# Patient Record
Sex: Female | Born: 1950 | Race: White | Hispanic: No | Marital: Married
Health system: Midwestern US, Community
[De-identification: ages and names within clinical notes are randomized; demographics above are authoritative.]

## PROBLEM LIST (undated history)

## (undated) DIAGNOSIS — I350 Nonrheumatic aortic (valve) stenosis: Secondary | ICD-10-CM

## (undated) DIAGNOSIS — M66872 Spontaneous rupture of other tendons, left ankle and foot: Secondary | ICD-10-CM

## (undated) DIAGNOSIS — I7300 Raynaud's syndrome without gangrene: Secondary | ICD-10-CM

## (undated) DIAGNOSIS — M1380 Other specified arthritis, unspecified site: Secondary | ICD-10-CM

## (undated) DIAGNOSIS — M1990 Unspecified osteoarthritis, unspecified site: Secondary | ICD-10-CM

## (undated) DIAGNOSIS — E139 Other specified diabetes mellitus without complications: Secondary | ICD-10-CM

## (undated) DIAGNOSIS — M359 Systemic involvement of connective tissue, unspecified: Secondary | ICD-10-CM

## (undated) DIAGNOSIS — G4730 Sleep apnea, unspecified: Secondary | ICD-10-CM

## (undated) DIAGNOSIS — Z8669 Personal history of other diseases of the nervous system and sense organs: Secondary | ICD-10-CM

## (undated) DIAGNOSIS — E785 Hyperlipidemia, unspecified: Secondary | ICD-10-CM

## (undated) DIAGNOSIS — K219 Gastro-esophageal reflux disease without esophagitis: Secondary | ICD-10-CM

## (undated) DIAGNOSIS — R112 Nausea with vomiting, unspecified: Secondary | ICD-10-CM

## (undated) DIAGNOSIS — R0600 Dyspnea, unspecified: Secondary | ICD-10-CM

## (undated) DIAGNOSIS — G709 Myoneural disorder, unspecified: Secondary | ICD-10-CM

## (undated) DIAGNOSIS — J45909 Unspecified asthma, uncomplicated: Secondary | ICD-10-CM

## (undated) DIAGNOSIS — Q631 Lobulated, fused and horseshoe kidney: Secondary | ICD-10-CM

## (undated) DIAGNOSIS — Z9889 Other specified postprocedural states: Secondary | ICD-10-CM

## (undated) DIAGNOSIS — M341 CR(E)ST syndrome: Secondary | ICD-10-CM

## (undated) DIAGNOSIS — M797 Fibromyalgia: Secondary | ICD-10-CM

## (undated) DIAGNOSIS — R011 Cardiac murmur, unspecified: Secondary | ICD-10-CM

## (undated) DIAGNOSIS — F32A Depression, unspecified: Secondary | ICD-10-CM

## (undated) DIAGNOSIS — I10 Essential (primary) hypertension: Secondary | ICD-10-CM

## (undated) DIAGNOSIS — M545 Low back pain, unspecified: Secondary | ICD-10-CM

## (undated) DIAGNOSIS — M25872 Other specified joint disorders, left ankle and foot: Secondary | ICD-10-CM

## (undated) DIAGNOSIS — M76822 Posterior tibial tendinitis, left leg: Secondary | ICD-10-CM

## (undated) HISTORY — DX: Essential (primary) hypertension: I10

## (undated) HISTORY — DX: Other specified diabetes mellitus without complications: E13.9

## (undated) HISTORY — DX: Unspecified asthma, uncomplicated: J45.909

## (undated) HISTORY — DX: Nonrheumatic aortic (valve) stenosis: I35.0

## (undated) HISTORY — PX: BREAST BIOPSY: SHX20

## (undated) HISTORY — DX: Systemic involvement of connective tissue, unspecified: M35.9

## (undated) HISTORY — PX: ABDOMINAL HYSTERECTOMY: SHX81

## (undated) HISTORY — PX: CATARACT EXTRACTION, BILATERAL: SHX1313

## (undated) HISTORY — PX: LUMBAR FUSION: SHX111

## (undated) HISTORY — DX: Hyperlipidemia, unspecified: E78.5

## (undated) HISTORY — PX: EYE SURGERY: SHX253

## (undated) HISTORY — PX: TUBAL LIGATION: SHX77

## (undated) HISTORY — PX: POSTERIOR TIBIAL TENDON REPAIR: SHX6039

## (undated) HISTORY — PX: ROTATOR CUFF REPAIR: SHX139

## (undated) HISTORY — PX: COLONOSCOPY WITH ESOPHAGOGASTRODUODENOSCOPY (EGD) AND ESOPHAGEAL DILATION (ED): SHX6495

## (undated) HISTORY — PX: CARDIAC CATHETERIZATION: SHX172

---

## 1971-10-21 HISTORY — PX: WISDOM TOOTH EXTRACTION: SHX21

## 2008-02-19 LAB — METABOLIC PANEL, COMPREHENSIVE
A-G Ratio: 1.1 (ref 1.1–2.2)
ALT (SGPT): 44 U/L (ref 30–65)
AST (SGOT): 23 U/L (ref 15–37)
Albumin: 4.2 g/dL (ref 3.5–5.0)
Alk. phosphatase: 95 U/L (ref 50–136)
Anion gap: 8 mmol/L (ref 5–15)
BUN/Creatinine ratio: 19 (ref 12–20)
BUN: 17 MG/DL (ref 6–20)
Bilirubin, total: 0.3 MG/DL (ref <1.0)
CO2: 30 MMOL/L (ref 21–32)
Calcium: 9.9 MG/DL (ref 8.5–10.1)
Chloride: 102 MMOL/L (ref 97–108)
Creatinine: 0.9 MG/DL (ref 0.6–1.3)
GFR est AA: >60 mL/min/{1.73_m2} (ref >60)
GFR est non-AA: >60 mL/min/{1.73_m2} (ref >60)
Globulin: 4 g/dL (ref 2.0–4.0)
Glucose: 105 MG/DL — ABNORMAL HIGH (ref 50–100)
Potassium: 3.1 MMOL/L — ABNORMAL LOW (ref 3.5–5.1)
Protein, total: 8.2 g/dL (ref 6.4–8.2)
Sodium: 140 MMOL/L (ref 136–145)

## 2008-02-19 LAB — CBC WITH AUTOMATED DIFF
ABS. BASOPHILS: 0.1 10*3/uL (ref 0.0–0.1)
ABS. EOSINOPHILS: 0.2 10*3/uL (ref 0.0–0.4)
ABS. LYMPHOCYTES: 2.5 10*3/uL (ref 0.8–3.5)
ABS. MONOCYTES: 0.4 10*3/uL (ref 0–1.0)
ABS. NEUTROPHILS: 3.3 10*3/uL (ref 1.8–8.0)
BASOPHILS: 1 % (ref 0–1)
EOSINOPHILS: 3 % (ref 0–7)
HCT: 43.1 % (ref 35.0–47.0)
HGB: 15.1 g/dL (ref 11.5–16.0)
LYMPHOCYTES: 38 % (ref 12–49)
MCH: 31.9 PG (ref 26.0–34.0)
MCHC: 35 g/dL (ref 30.0–35.0)
MCV: 90.9 FL (ref 80.0–99.0)
MONOCYTES: 6 % (ref 5–13)
NEUTROPHILS: 52 % (ref 32–75)
PLATELET: 302 10*3/uL (ref 150–400)
RBC: 4.74 M/uL (ref 3.80–5.20)
RDW: 12.6 % (ref 11.5–14.5)
WBC: 6.5 10*3/uL (ref 3.6–11.0)

## 2008-02-19 LAB — CK W/ REFLX CKMB: CK: 49 U/L (ref 21–215)

## 2008-02-19 LAB — NT-PRO BNP: NT pro-BNP: <10 PG/ML (ref 0–125)

## 2008-02-19 LAB — TROPONIN I: Troponin-I, Qt.: <0.04 ng/mL (ref 0.0–0.05)

## 2011-03-03 NOTE — Procedures (Signed)
Name:       [name]                     Study Date:  02/11/2011                                               Ordered By:  Ria Clock, MD  Account #:  [id number]                     Sex:         F  DOB:        09-19-1951     Age:   59        Location:    [facility]                                  PULMONARY TEST    STUDY DATE:  02/11/11                 Ref.       Pre-Meas   Pre        Post       Post       Post                                     %Ref.      Meas       %Ref       %Change  SPIROMETRY  FVC (L)      2.90       3.09       107        3.28       113        6  FEV1 (L)     2.35       2.21       94         2.37       101        7  FEV1/FVC%    83         72         87         72         87         0  FEF 25-75%   2.54       1.46       58         1.67       66         14  PEF L/s      5.60       6.78       121        7.27       130        7  FET 100% (s)            9.64                  7.58                  -21  FIVC (L)     2.90  2.83       98         3.02       104        7  FIF50% L/s              3.63                  3.71                  2  FVL ECode               000010                001010  MVV L/min    92         90         98         86         94         -4    LUNG         VOLUMES    TLC (L)  VC (L)  IC (L)  FRC PL (L)  Vtg (L)  ERV (L)  RV (L)  RV/TLC %  Vt (L)    DIFFUSION    CAPACITY    DLCO  DL Adj  VA (L)  DLCO/VA  DL/VA Adj  IVC (L)      REASON FOR TEST: Asthma.  Spirometry was performed and it revealed: 1. No airflow obstruction.2.  Normal FVC.3. No significant improvement with bronchodilators.4. Flow  volume loop is within normal limits.                Vickki Muff, M.D.    cc:   Vickki Muff, M.D.        Ruby Cola., MD      ERG/wmx; D: 02/28/2011 ; T: 03/03/2011 12:38 P; DOC# [id number]; Job#  [id number]

## 2011-03-03 NOTE — Procedures (Signed)
Name:       [name], [name]                     Study Date:  02/11/2011                                               Ordered By:  Robert Rolfes, MD  Account #:  [id number]                     Sex:         F  DOB:        06/24/1951     Age:   59        Location:    [facility]                                  PULMONARY TEST    STUDY DATE:  02/11/11                 Ref.       Pre-Meas   Pre        Post       Post       Post                                     %Ref.      Meas       %Ref       %Change  SPIROMETRY  FVC (L)      2.90       3.09       107        3.28       113        6  FEV1 (L)     2.35       2.21       94         2.37       101        7  FEV1/FVC%    83         72         87         72         87         0  FEF 25-75%   2.54       1.46       58         1.67       66         14  PEF L/s      5.60       6.78       121        7.27       130        7  FET 100% (s)            9.64                  7.58                  -21  FIVC (L)     2.90         2.83       98         3.02       104        7  FIF50% L/s              3.63                  3.71                  2  FVL ECode               000010                001010  MVV L/min    92         90         98         86         94         -4    LUNG         VOLUMES    TLC (L)  VC (L)  IC (L)  FRC PL (L)  Vtg (L)  ERV (L)  RV (L)  RV/TLC %  Vt (L)    DIFFUSION    CAPACITY    DLCO  DL Adj  VA (L)  DLCO/VA  DL/VA Adj  IVC (L)      REASON FOR TEST: Asthma.  Spirometry was performed and it revealed: 1. No airflow obstruction.2.  Normal FVC.3. No significant improvement with bronchodilators.4. Flow  volume loop is within normal limits.                Corley Kohls R. Christphor Groft, M.D.    cc:   Melissa Pulido R. Pavlos Yon, M.D.        Robert J. Rolfes, Jr., MD      ERG/wmx; D: 02/28/2011 ; T: 03/03/2011 12:38 P; DOC# [id number]; Job#  [id number]

## 2011-10-21 HISTORY — PX: LUMBAR FUSION: SHX111

## 2012-05-20 ENCOUNTER — Encounter

## 2012-06-28 LAB — CBC W/O DIFF
HCT: 42 % (ref 35.0–47.0)
HGB: 14.1 g/dL (ref 11.5–16.0)
MCH: 31.1 PG (ref 26.0–34.0)
MCHC: 33.6 g/dL (ref 30.0–36.5)
MCV: 92.7 FL (ref 80.0–99.0)
PLATELET: 292 10*3/uL (ref 150–400)
RBC: 4.53 M/uL (ref 3.80–5.20)
RDW: 12.9 % (ref 11.5–14.5)
WBC: 6.8 10*3/uL (ref 3.6–11.0)

## 2012-06-28 LAB — METABOLIC PANEL, BASIC
Anion gap: 7 mmol/L (ref 5–15)
BUN/Creatinine ratio: 30 — ABNORMAL HIGH (ref 12–20)
BUN: 18 MG/DL (ref 6–20)
CO2: 31 MMOL/L (ref 21–32)
Calcium: 9.4 MG/DL (ref 8.5–10.1)
Chloride: 102 MMOL/L (ref 97–108)
Creatinine: 0.6 MG/DL (ref 0.45–1.15)
GFR est AA: >60 mL/min/{1.73_m2} (ref >60)
GFR est non-AA: >60 mL/min/{1.73_m2} (ref >60)
Glucose: 74 MG/DL (ref 65–100)
Potassium: 4 MMOL/L (ref 3.5–5.1)
Sodium: 140 MMOL/L (ref 136–145)

## 2012-06-28 LAB — EKG, 12 LEAD, INITIAL
Atrial Rate: 79 {beats}/min
Calculated P Axis: 39 degrees
Calculated R Axis: -3 degrees
Calculated T Axis: 46 degrees
Diagnosis: NORMAL
P-R Interval: 154 ms
Q-T Interval: 416 ms
QRS Duration: 88 ms
QTC Calculation (Bezet): 477 ms
Ventricular Rate: 79 {beats}/min

## 2012-06-28 LAB — TYPE & SCREEN
ABO/Rh(D): O NEG
Antibody screen: NEGATIVE

## 2012-06-28 LAB — URINALYSIS W/MICROSCOPIC
Bacteria: NEGATIVE /HPF
Bilirubin: NEGATIVE
Blood: NEGATIVE
Glucose: NEGATIVE MG/DL
Ketone: NEGATIVE MG/DL
Leukocyte Esterase: NEGATIVE
Nitrites: NEGATIVE
Protein: NEGATIVE MG/DL
Specific gravity: 1.007 (ref 1.003–1.030)
Urobilinogen: 0.2 EU/DL (ref 0.2–1.0)
pH (UA): 6 (ref 5.0–8.0)

## 2012-06-28 LAB — HEMOGLOBIN A1C WITH EAG
Est. average glucose: 120 mg/dL
Hemoglobin A1c: 5.8 % (ref 4.2–6.3)

## 2012-06-28 LAB — PROTHROMBIN TIME + INR
INR: 1 (ref 0.9–1.1)
Prothrombin time: 10.7 s (ref 9.4–11.7)

## 2012-06-28 LAB — PTT: aPTT: 27.3 s (ref 23.0–30.0)

## 2012-06-28 LAB — PREALBUMIN: Prealbumin: 37.5 mg/dL (ref 20.0–40.0)

## 2012-06-28 LAB — ALBUMIN: Albumin: 4 g/dL (ref 3.5–5.0)

## 2012-06-28 LAB — TYPE AND SCREEN
ABO/Rh: O NEG
Antibody Screen: NEGATIVE

## 2012-06-28 NOTE — Other (Addendum)
[facility]  Preoperative Instructions        Surgery Date  07-06-12          Time of Arrival 8:00am    1. On the day of your surgery, please report to the Surgical Services Registration Desk and sign in at your designated time. The Surgery Center is located to the right of the Emergency Room.    2. You must have someone with you to drive you home.You should not drive a car for 24 hours following surgery.    3. Do not have anything to eat or drink ( including water, gum, mints, coffee, juice) after midnight 07-05-12 . This may not apply to medications prescribed by your physician.  Please note special instructions, if applicable.  If you are currently taking Plavix, Coumadin, or other blood-thinning agents, contact your surgeon for instructions.    4. We recommend you do not drink any alcoholic beverages for 24 hours before and after your surgery.    5. Have a list of all current medications, including vitamins, herbal supplements and any other over the counter medications.  Stop Aspirin and non-steroidal anti-inflammatory drugs (I.e. Advil, Aleve) as directed by your surgeon's office. Stop all vitamins and herbal supplements seven days prior to your surgery.    6. Wear comfortable clothes.  Wear glasses instead of contacts.  Do not bring any money or jewelry. Please bring picture ID, insurance card, and any prearranged co-payment or hospital payment.  Do not wear make-up, particularly mascara, the morning of your surgery.  Do not wear nail polish, particularly if you are having foot /hand surgery.  Wear your hair loose or down, no ponytails, buns, bobby pins or clips.  All body piercings must be removed.  Please shower the night before or morning of surgery, but do not apply any lotions, powders or deodorants afterwards. Please do not shave for 48 hours prior to surgery, shaving of the face is acceptable.    7. You should understand that if you do not follow these instructions your surgery  may be cancelled.  If your physical condition changes (I.e. fever, cold or flu) please contact your surgeon as soon as possible.    8. It is important that you be on time.  If a situation occurs where you may be late, please call [phone] (OR Holding Area).    9. If you have any questions and or problems, please call [phone] (Pre-admission Testing).    10. Your surgery time may be subject to change.  You will receive a phone call the evening prior if your time changes.    11.  If having outpatient surgery, you must have someone to drive you here, stay with you during the duration of your stay, and to drive you home at time of discharge.      Special Instructions:    MEDICATIONS TO TAKE THE MORNING OF SURGERY WITH A SIP OF WATER: [email or web address]      I understand a pre-operative phone call will be made to verify my surgery time.  In the event that I am not available, I give permission for a message to be left on my answering service and/or with another person?  Yes [phone]         ___________________      ___________________  _________  (Signature of Patient)          (Witness)                              (  Date and Time)

## 2012-06-29 LAB — CULTURE, MRSA

## 2012-06-29 LAB — CULTURE, URINE
Colonies Counted: <1000
Colony Count: <1000
Culture result:: NO GROWTH
Culture: NO GROWTH

## 2012-06-29 LAB — VITAMIN D, 25 HYDROXY: Vitamin D 25-Hydroxy: 19.2 ng/mL — ABNORMAL LOW (ref 30–100)

## 2012-06-30 NOTE — Other (Signed)
Vit D faxed,confirmation.

## 2012-07-06 ENCOUNTER — Inpatient Hospital Stay
Admit: 2012-07-06 | Discharge: 2012-07-09 | Disposition: A | Discharge status: Home Health | Payer: BLUE CROSS/BLUE SHIELD | Attending: Orthopaedic Surgery | Admitting: Orthopaedic Surgery

## 2012-07-06 DIAGNOSIS — M4310 Spondylolisthesis, site unspecified: Secondary | ICD-10-CM

## 2012-07-06 MED ADMIN — dexamethasone (DECADRON) 4 mg/mL injection: @ 16:00:00 | NDC 63323016501

## 2012-07-06 MED ADMIN — oxyCODONE IR (ROXICODONE) tablet 5-10 mg: ORAL | @ 21:00:00 | NDC 68084035411

## 2012-07-06 MED ADMIN — fentaNYL citrate (PF) 50 mcg/mL injection: INTRAVENOUS | @ 16:00:00 | NDC 68258301001

## 2012-07-06 MED ADMIN — lactated ringers infusion: INTRAVENOUS | @ 15:00:00 | NDC 11845118709

## 2012-07-06 MED ADMIN — bacitracin 50,000 Units in sodium chloride irrigation 0.9 % 1,000 mL Irrigation: @ 17:00:00 | NDC 63323032930

## 2012-07-06 MED ADMIN — HYDROmorphone (PF) 15 mg/30 ml (DILAUDID) 15 mg/30 mL (0.5 mg/mL) PCA: INTRAVENOUS | @ 19:00:00 | NDC 02420030164

## 2012-07-06 MED ADMIN — acetaminophen (OFIRMEV) 1,000 mg/100 mL (10 mg/mL) infusion: @ 16:00:00 | NDC 43825010201

## 2012-07-06 MED ADMIN — 0.9% sodium chloride with KCl 20 mEq/L 20 mEq/L infusion: INTRAVENOUS | @ 19:00:00 | NDC 00409711509

## 2012-07-06 MED ADMIN — ceFAZolin (ANCEF) injection 2 g: INTRAVENOUS | @ 16:00:00 | NDC 00781345196

## 2012-07-06 MED ADMIN — gabapentin (NEURONTIN) capsule 100 mg: ORAL | @ 21:00:00 | NDC 68084059411

## 2012-07-06 MED ADMIN — sodium chloride (NS) flush 5-10 mL: INTRAVENOUS | @ 18:00:00 | NDC 87701099893

## 2012-07-06 MED ADMIN — lactated ringers infusion: INTRAVENOUS | @ 13:00:00 | NDC 00409795309

## 2012-07-06 MED ADMIN — lactated ringers infusion: INTRAVENOUS | @ 20:00:00 | NDC 11845118709

## 2012-07-06 MED FILL — NS WITH POTASSIUM CHLORIDE 20 MEQ/L IV: 20 mEq/L | INTRAVENOUS | Qty: 1000

## 2012-07-06 MED FILL — FENTANYL CITRATE (PF) 50 MCG/ML IJ SOLN: 50 mcg/mL | INTRAMUSCULAR | Qty: 5

## 2012-07-06 MED FILL — MIDAZOLAM 1 MG/ML IJ SOLN: 1 mg/mL | INTRAMUSCULAR | Qty: 2

## 2012-07-06 MED FILL — GABAPENTIN 100 MG CAP: 100 mg | ORAL | Qty: 1

## 2012-07-06 MED FILL — SALINE FLUSH INJECTION SYRINGE: INTRAMUSCULAR | Qty: 10

## 2012-07-06 MED FILL — OFIRMEV 1,000 MG/100 ML (10 MG/ML) INTRAVENOUS SOLUTION: 1000 mg/100 mL (10 mg/mL) | INTRAVENOUS | Qty: 100

## 2012-07-06 MED FILL — INDAPAMIDE 2.5 MG TAB: 2.5 mg | ORAL | Qty: 1

## 2012-07-06 MED FILL — HYDROMORPHONE 15 MG/30 ML (0.5 MG/ML) IN 0.9% SOD.CHLORIDE INFUSION: 15 mg/30 mL (0.5 mg/mL) | INTRAVENOUS | Qty: 30

## 2012-07-06 MED FILL — OXYCODONE 5 MG TAB: 5 mg | ORAL | Qty: 2

## 2012-07-06 MED FILL — LACTATED RINGERS IV: INTRAVENOUS | Qty: 1000

## 2012-07-06 MED FILL — BACITRACIN 50,000 UNIT IM: 50000 unit | INTRAMUSCULAR | Qty: 50000

## 2012-07-06 MED FILL — DEXAMETHASONE SODIUM PHOSPHATE 4 MG/ML IJ SOLN: 4 mg/mL | INTRAMUSCULAR | Qty: 1

## 2012-07-06 NOTE — Op Note (Signed)
Name:      Ian Bushman                                          Surgeon:        Job Founds, MD  Account #: [id number]                 Surgery Date:   07/06/2012  DOB:       [birth year]  Age:       61                           Location:                                 OPERATIVE REPORT      PREOPERATIVE DIAGNOSES  1. L4-L5 spondylolisthesis.  2. Spinal stenosis with claudication at L3-L4 and L4-L5.  3. Sciatica.  4. Lumbago.  5. Obesity.    POSTOPERATIVE DIAGNOSES  1. L4-L5 spondylolisthesis.  2. Spinal stenosis with claudication at L3-L4 and L4-L5.  3. Sciatica.  4. Lumbago.  5. Obesity.    PROCEDURES PERFORMED  1. L4-L5 posterior instrumented fusion with a transforaminal lumbar  interbody fusion.  2. L4-L5 insertion of interbody biomechanical device.  3. Use of operative microscope.  4. Use of local autograft bone for spine fusion.  5. Use of allograft bone for spine fusion.  6. L4 Gill laminectomy.  7. L5 laminectomy, facetectomy and foraminotomy.  8. L3 laminectomy, facetectomy and foraminotomy.  9. Posterior instrumentation, 1 level.    Please note that a modifier -22 was used for all the codes in this case due  to the patient's obesity with a BMI of 38, requiring 50% longer time to  complete this procedure.    SURGEON: Theodis Sato, MD.    FIRST ASSISTANT: Linward Foster, PA-c.    ESTIMATED BLOOD LOSS: Less than 100 mL.    COMPLICATIONS: None.    IMPLANTS USED: The Synthes TPAL interbody biomechanical spacer and the  Synthes Matrix pedicle screw system.    ANESTHESIA:  General.    SPECIMENS REMOVED:  None.    INDICATIONS FOR THE PROCEDURE: The patient is a very pleasant 61 year old  lady presenting here for surgery today. She has failed conservative  measures and has at this point decided to proceed with surgical  intervention. We have given her warnings about possible complications,  including but not limited to pain, scar, bleeding, infection, nonunion,  damage to surrounding structures, death,  paralysis, blindness and stroke.  She understands and wants to proceed.    DESCRIPTION OF PROCEDURE: After informed consent was obtained and the  operative site was properly marked, the patient was moved back in the  operating room and underwent general endotracheal anesthesia. She was  positioned prone on the operating room table using the [employer] frame. Fluoroscopy was used to mark the level of the incision. The  knees were gently bent with pillows. The arms were placed in 90/90  position. The area was then prepped and draped in the usual manner,  followed by obtaining a time-out, verifying that this was the correct  patient, the correct surgery and the correct site as well as that she had  received IV antibiotics within 30 minutes of the incision.  We then proceeded to perform a standard posterior approach to the lumbar  spine, exposing the area between L3, L4 and L5. Once that was exposed, we  proceeded then to obtain hemostasis; and then using fluoroscopy on the AP  view, we proceeded to place pedicle screws bilaterally at L4 and L5. The  pedicle screw was placed by getting a starting hole with a Midas Rex and  then using the tap through that hole to cannulate the pedicle. The pedicles  were never stimulated as it was inserted, and we proceeded then to insert  the screw of premeasured length on that area. Once all the screws were in  place, we proceeded to switch to a lateral view; and then on the lateral,  we were able to finish inserting the screws to the appropriate depth. Once  the screws were inserted, we proceeded then to use the right L5 screw as a  marker to serve to create a channel for the L4-L5 TLIF. Using the Midas  Rex, we proceeded to remove the bone over Codman's triangle, exposing the  disk space on the right side at L4-L5. With that bone exposed and the  neural structures protected by the bony structures, we were able then to  use that channel to instrument the disk for the  TLIF. The disk was first  entered with a curet, followed by using box curettes, straight and curved  and then rasps straight and finally using shavers and pituitary to remove  the disk material. Once the area was prepared, leaving behind punctate  bleeding bone, that area was packed with Trinity allograft bone. That bone  was packed anteriorly with a TLIF trial, and a TLIF was inserted and turned  90 degrees anteriorly. Once the TLIF was in place, we proceeded then to  begin our decompression part of the procedure. Using a Midas Rex, we  started at L3 around the pedicle of L4, wrapping around, creating a keyhole  foraminotomy around the 7 o'clock position on the pedicle of L4, and  freeing the L4 nerve root in that area; and then once the keyhole was open,  we followed that hole with a Kerrison #3 proximally, wrapping around the  pedicle and freeing the lateral recess at the L3 level. The procedure was  repeated then in the same manner on the L5 pedicle bilaterally, freeing  bilateral lateral recesses at L4 and foraminotomy at L5. Once this was  completed, the nerve roots were felt with a Murphy ball, verifying that  they were free. We proceeded then to irrigate the wound with 3 L of normal  saline. We proceeded to place our tulips on the screws; and then once we  placed our tulips, we were able to place our rods and our caps and lock  them into position. We proceeded then to decorticate the posterior elements  on the left side and placed our Trinity allograft bone in that area mixed  with local autograft bone. We then proceeded to place 1 gram of vancomycin  powder on the right side and close the fascia with #1 Vicryl, followed by  irrigating the subcutaneous tissue, placing a superficial drain, and  closing the subcutaneous fat with a V-Loc followed by closing the  subcutaneous tissue with 2-0 Vicryl and the skin with a 2-0 Vicryl running  suture and Dermabond. Sterile dressing was applied. The patient  was  awakened and transferred to the PACU in stable condition.    POSTOPERATIVE PLAN: The patient is  going to remain here for about 2 or 3  days. We are going give her TED hose for DVT prophylaxis and Ancef for  infection prophylaxis.        Reviewed on 07/06/2012 6:35 PM          Job Founds, MD    cc:   Job Founds, MD    [facility]      AAR/wmx; D: 07/06/2012 02:55 P; T: 07/06/2012 06:20 P; Doc# [id number]; Job#  [id number]

## 2012-07-06 NOTE — Other (Signed)
1228 family updated to surgery start time.

## 2012-07-06 NOTE — Op Note (Signed)
Name:      Ian Bushman                                          Surgeon:        Job Founds, MD  Account #: [id number]                 Surgery Date:   07/06/2012  DOB:       [birth year]  Age:       61                           Location:                                 OPERATIVE REPORT      PREOPERATIVE DIAGNOSES  1. L4-L5 spondylolisthesis.  2. Spinal stenosis with claudication at L3-L4 and L4-L5.  3. Sciatica.  4. Lumbago.  5. Obesity.    POSTOPERATIVE DIAGNOSES  1. L4-L5 spondylolisthesis.  2. Spinal stenosis with claudication at L3-L4 and L4-L5.  3. Sciatica.  4. Lumbago.  5. Obesity.    PROCEDURES PERFORMED  1. L4-L5 posterior instrumented fusion with a transforaminal lumbar  interbody fusion.  2. L4-L5 insertion of interbody biomechanical device.  3. Use of operative microscope.  4. Use of local autograft bone for spine fusion.  5. Use of allograft bone for spine fusion.  6. L4 Gill laminectomy.  7. L5 laminectomy, facetectomy and foraminotomy.  8. L3 laminectomy, facetectomy and foraminotomy.  9. Posterior instrumentation, 1 level.    Please note that a modifier -22 was used for all the codes in this case due  to the patient's obesity with a BMI of 38, requiring 50% longer time to  complete this procedure.    SURGEON: Theodis Sato, MD.    FIRST ASSISTANT: Linward Foster, PA-c.    ESTIMATED BLOOD LOSS: Less than 100 mL.    COMPLICATIONS: None.    IMPLANTS USED: The Synthes TPAL interbody biomechanical spacer and the  Synthes Matrix pedicle screw system.    ANESTHESIA:  General.    SPECIMENS REMOVED:  None.    INDICATIONS FOR THE PROCEDURE: The patient is a very pleasant 60 year old  lady presenting here for surgery today. She has failed conservative  measures and has at this point decided to proceed with surgical  intervention. We have given her warnings about possible complications,  including but not limited to pain, scar, bleeding, infection, nonunion,  damage to surrounding structures, death,  paralysis, blindness and stroke.  She understands and wants to proceed.    DESCRIPTION OF PROCEDURE: After informed consent was obtained and the  operative site was properly marked, the patient was moved back in the  operating room and underwent general endotracheal anesthesia. She was  positioned prone on the operating room table using the [employer] frame. Fluoroscopy was used to mark the level of the incision. The  knees were gently bent with pillows. The arms were placed in 90/90  position. The area was then prepped and draped in the usual manner,  followed by obtaining a time-out, verifying that this was the correct  patient, the correct surgery and the correct site as well as that she had  received IV antibiotics within 30 minutes of the incision.  We then proceeded to perform a standard posterior approach to the lumbar  spine, exposing the area between L3, L4 and L5. Once that was exposed, we  proceeded then to obtain hemostasis; and then using fluoroscopy on the AP  view, we proceeded to place pedicle screws bilaterally at L4 and L5. The  pedicle screw was placed by getting a starting hole with a Midas Rex and  then using the tap through that hole to cannulate the pedicle. The pedicles  were never stimulated as it was inserted, and we proceeded then to insert  the screw of premeasured length on that area. Once all the screws were in  place, we proceeded to switch to a lateral view; and then on the lateral,  we were able to finish inserting the screws to the appropriate depth. Once  the screws were inserted, we proceeded then to use the right L5 screw as a  marker to serve to create a channel for the L4-L5 TLIF. Using the Midas  Rex, we proceeded to remove the bone over Codman's triangle, exposing the  disk space on the right side at L4-L5. With that bone exposed and the  neural structures protected by the bony structures, we were able then to  use that channel to instrument the disk for the  TLIF. The disk was first  entered with a curet, followed by using box curettes, straight and curved  and then rasps straight and finally using shavers and pituitary to remove  the disk material. Once the area was prepared, leaving behind punctate  bleeding bone, that area was packed with Trinity allograft bone. That bone  was packed anteriorly with a TLIF trial, and a TLIF was inserted and turned  90 degrees anteriorly. Once the TLIF was in place, we proceeded then to  begin our decompression part of the procedure. Using a Midas Rex, we  started at L3 around the pedicle of L4, wrapping around, creating a keyhole  foraminotomy around the 7 o'clock position on the pedicle of L4, and  freeing the L4 nerve root in that area; and then once the keyhole was open,  we followed that hole with a Kerrison #3 proximally, wrapping around the  pedicle and freeing the lateral recess at the L3 level. The procedure was  repeated then in the same manner on the L5 pedicle bilaterally, freeing  bilateral lateral recesses at L4 and foraminotomy at L5. Once this was  completed, the nerve roots were felt with a Murphy ball, verifying that  they were free. We proceeded then to irrigate the wound with 3 L of normal  saline. We proceeded to place our tulips on the screws; and then once we  placed our tulips, we were able to place our rods and our caps and lock  them into position. We proceeded then to decorticate the posterior elements  on the left side and placed our Trinity allograft bone in that area mixed  with local autograft bone. We then proceeded to place 1 gram of vancomycin  powder on the right side and close the fascia with #1 Vicryl, followed by  irrigating the subcutaneous tissue, placing a superficial drain, and  closing the subcutaneous fat with a V-Loc followed by closing the  subcutaneous tissue with 2-0 Vicryl and the skin with a 2-0 Vicryl running  suture and Dermabond. Sterile dressing was applied. The patient was   awakened and transferred to the PACU in stable condition.    POSTOPERATIVE PLAN: The patient is  going to remain here for about 2 or 3  days. We are going give her TED hose for DVT prophylaxis and Ancef for  infection prophylaxis.        Reviewed on 07/06/2012 6:35 PM          Job Founds, MD    cc:   Job Founds, MD    [facility] BILLING OFFICE      AAR/wmx; D: 07/06/2012 02:55 P; T: 07/06/2012 06:20 P; Doc# [id number]; Job#  [id number]

## 2012-07-06 NOTE — Progress Notes (Signed)
+  Post-Anesthesia Evaluation and Assessment    Patient: [name] MRN: [id number]  SSN: [id number]   Date of Birth: 12-11-1950  Age: 61 y.o.  Sex: female      Cardiovascular Function/Vital Signs  Visit Vitals   Item Reading   ??? BP 140/71   ??? Pulse 96   ??? Temp 99.2 ??F (37.3 ??C)   ??? Resp 22   ??? SpO2 98%       Patient is status post Procedure(s) with comments:  SPINE LUMBAR LAMINECTOMY WITH INSTRUMENTATION - L4-5 DECOMPRESSION AND FUSION.    Nausea/Vomiting: Controlled.    Postoperative hydration reviewed and adequate.    Pain:  Pain Scale 1: Numeric (0 - 10) (07/06/12 1504)  Pain Intensity 1: 0 (07/06/12 1504)   Managed.    Neurological Status:   Neuro (WDL): Within Defined Limits (07/06/12 1504)   At baseline.    Mental Status and Level of Consciousness: Arousable.    Pulmonary Status:   O2 Device: Nasal cannula (07/06/12 1504)   Adequate oxygenation and airway patent.    Complications related to anesthesia: None    Post-anesthesia assessment completed. No concerns.    Signed By: Adora Fridge, MD    07/06/2012

## 2012-07-06 NOTE — Brief Op Note (Signed)
BRIEF OPERATIVE NOTE    Date of Procedure: 07/06/2012   Preoperative Diagnosis: LUMBARGO, RIDICULOPATHY, STENOSIS  Postoperative Diagnosis: LUMBAGO, RIDICULOPATHY, STENOSIS    Procedure: Procedure(s):  L4-5 DECOMPRESSION AND FUSION  Surgeon(s) and Role:     * Job Founds, MD - Primary  Anesthesia: General   Estimated Blood Loss: 100cc  Specimens: * No specimens in log *   Findings: spondylolisthesis   Complications: None  Implants:   Implant Name Type Inv. Item Serial No. Manufacturer Lot No. LRB No. Used Action   TRINITY ELITE, EXTRA LARGE   [id number]  NA N/A 1 Implanted   SCR SPNE MATRIX 6X45MM TI - SNA  SCR SPNE MATRIX 6X45MM TI NA SYNTHES [place]  N/A 4 Implanted   LOCKING CAP    NA   N/A 4 Implanted   REDUCTION HEAD    NA   N/A 4 Implanted   ROD SPNE CRV MATRIX 5.5X45MM - SNA  ROD SPNE CRV MATRIX 5.5X45MM NA SYNTHES [place]  N/A 2 Implanted    TAP      NA     N/A 1 Implanted

## 2012-07-06 NOTE — Other (Signed)
Handoff Report from Operating Room to PACU    Report received from C. Rushing CRNA and Darla Lesches RN  regarding [name].      Surgeon(s):  Job Founds, MD  And Procedure(s) (LRB):  SPINE LUMBAR LAMINECTOMY WITH INSTRUMENTATION (N/A)  confirmed   with allergies, drains and dressings discussed.    Anesthesia type, drugs, patient history, complications, estimated blood loss, vital signs, intake and output, and last pain medication and lines were reviewed.

## 2012-07-06 NOTE — H&P (Signed)
HISTORY AND PHYSICAL    Subjective:     CHIEF COMPLAINT:  Low back pain radiating to bilateral lower extremities.       HISTORY OF PRESENT ILLNESS:  Ms. Stickles is a pleasant 61 year old lady coming in today for follow up of low back pain radiating down bilateral lower extremities through the buttocks causing numbness and tingling in the legs all the way down.  She has had epidural steroid injections with no relief.  She has also had chiropractor and therapy.  At this point she is considering surgical intervention.    There are no active problems to display for this patient.    Past Medical History   Diagnosis Date   ??? Hypertension    ??? Autoimmune disease      connective tissue disease   ??? Other ill-defined conditions      heart mumur   ??? Other and unspecified symptoms and signs involving general sensations and perceptions      fibromyalgia/osteo arthritis   ??? GERD (gastroesophageal reflux disease)    ??? Arthritis      osteo arthritis   ??? Asthma    ??? Thyroid disease      goiter/no longer on medication   ??? Nausea & vomiting       Past Surgical History   Procedure Date   ??? Hx orthopaedic      right rotator cuff repair   ??? Hx hysterectomy    ??? Hx tubal ligation    ??? Hx other surgical      cystectomy right shoulder blade   ??? Hx other surgical      wisdom teeth extraction      Prior to Admission medications    Medication Sig Start Date End Date Taking? Authorizing Provider   FLAXSEED OIL PO Take 1,200 mg by mouth daily.    Historical Provider   multivitamin (ONE A DAY) tablet Take 1 Tab by mouth daily.    Historical Provider   valsartan (DIOVAN) 80 mg tablet Take  by mouth daily.    Historical Provider   acetaminophen-caff-pyrilamine 500-60-15 mg Tab Take  by mouth as needed.    Historical Provider   loratadine 10 mg cap Take  by mouth daily.    Historical Provider   indapamide (LOZOL) 2.5 mg tablet Take  by mouth daily.    Historical Provider   potassium chloride (KLOR-CON M20) 20 mEq tablet Take  by mouth daily.     Historical Provider   omeprazole (PRILOSEC) 20 mg capsule Take 20 mg by mouth two (2) times a day.    Historical Provider   mometasone (NASONEX) 50 mcg/actuation nasal spray 2 Sprays daily.    Historical Provider     Allergies   Allergen Reactions   ??? Adhesive Rash   ??? Dobutamine Other (comments)     Pt had a paradoxical reaction during a stress test and dropped her heart rate and b/p.  See stress test notes in cc.   ??? Sulfa (Sulfonamide Antibiotics) Rash      History   Substance Use Topics   ??? Smoking status: Never Smoker    ??? Smokeless tobacco: Not on file   ??? Alcohol Use: Yes      occassionally      Family History   Problem Relation Age of Onset   ??? Lung Disease Mother    ??? Hypertension Mother    ??? Heart Disease Father       Review of Systems  No bowel or bladder incontinence.    No fevers or chills.    No saddle anesthesia.     Objective:     No data found.    She is a well-developed, well-nourished patient who appears appropriate for her stated age.  CARDIOVASCULAR:    No swelling, edema or cyanosis.    She has 2+ dorsalis pedis pulses bilaterally.  RESPIRATORY:    No labored breathing.  LYMPHATIC:  No lymphadenopathy.  SKIN:     There are no rashes or lesions on the low back or bilateral lower extremities.    MUSCULOSKELETAL:    Her gait is normal.  She is able to stand on her heels and on her toes.   THORACOLUMBAR SPINE:   She has no masses, no tenderness to palpation.  She has full range of motion.    BILATERAL LOWER EXTREMITIES:  No masses, no tenderness to palpation, full range of motion.  No pain with internal and external rotation of the hips.  She has 5/5 strength in all the key muscle groups from L2 to S1.  Negative straight leg raise.       NEUROLOGIC EXAM:    She is active, A&O x 3.  Mood and affect are appropriate.  Sensation is intact to light touch in bilateral lower extremities.  Deep tendon reflexes are 2+ for bilateral patellar and Achilles tendon.  She has neutral Babinski.  No clonus to  bilateral ankles.  Negative Trendelenburg test bilaterally.    Imaging Review  Review of the MRI of the lumbar spine shows that she has worse spinal stenosis at L4-L5 with a grade I spondylolisthesis on the MRI.    Assessment:     Active Problems:   * No active hospital problems. *     Ms. Vlietstra is a pleasant 61 year old lady with lumbago, sciatica in bilateral lower extremities right being worse than the left, multilevel lumbar spondylosis and spondylolisthesis at L4-5 grade I.  She has had spinal stenosis with claudication which is worse in this recent MRI compared to the previous one.  At this point we talked about surgery.  We talked about doing an L4-L5 decompression and fusion with a R L3-4 decompression .  I have discussed this with her in detail today.  I have talked about the postoperative course, details of the surgery as well as possible complications including but not limited to pain, scar, bleeding, infection, nonunion, damage to surrounding structures, death, paralysis, blindness and stroke.  She understands and wants to proceed.       We discussed the risks of surgery include but are not limited to: death, heart attack, stroke, lung injury or infection, blindness, illeus, bladder or bowel problems, bleeding, nerve injury (including numbness, pain and weakness), paralysis (which may be permanent), failure to heal, failure to fuse bone together in fusion procedures, failure of relief of symptoms, failure of relief of pain, increased pain, need for further surgery, failure or breakage of hardware, malpositioning of hardware, need to fuse or operate on additional levels determined either during or after surgery, destabilization of the spine which may require fusion or later surgery, infections which  may or may not require additional surgery, dural tears (tears of the sack holding in nerves and spinal fluid, meningitis, voice changes, vocal cord injury, blood clots, pulmonary embolus, and anesthetic  complication.  Comorbidities such as obesity, smoking, rheumatoid arthritis, chronic steroid use and diabetes increases these risks.  He understands and wants to proceed.  Plan:     The various methods of treatment have been discussed with the patient and family.   After consideration of risks, benefits and other options for treatment, the patient has consented to surgical interventions (R L3-4 decompression with L4-5 decompression and fusion).  Questions were answered and Pre-op teaching was done by myself.

## 2012-07-06 NOTE — Other (Signed)
TRANSFER - OUT REPORT:    Verbal report given to Durene Cal RN  on [name]  being transferred to 3103(unit) for routine post - op       Report consisted of patient???s Situation, Background, Assessment and   Recommendations(SBAR).     Information from the following report(s) SBAR, Kardex, OR Summary, Procedure Summary, Intake/Output, MAR and Recent Results was reviewed with the receiving nurse.    Opportunity for questions and clarification was provided.

## 2012-07-06 NOTE — Progress Notes (Signed)
Bedside and Verbal shift change report given to Louann Liv, RN   (oncoming nurse) by Everardo Beals (offgoing nurse).  Report given with SBAR, Kardex, MAR and Recent Results.

## 2012-07-07 LAB — HEMOGLOBIN: HGB: 12.4 g/dL (ref 11.5–16.0)

## 2012-07-07 MED ADMIN — therapeutic multivitamin (THERAGRAN) tablet 1 Tab: ORAL | @ 13:00:00 | NDC 00182451810

## 2012-07-07 MED ADMIN — gabapentin (NEURONTIN) capsule 100 mg: ORAL | @ 02:00:00 | NDC 68084059411

## 2012-07-07 MED ADMIN — valsartan (DIOVAN) tablet 80 mg: ORAL | @ 16:00:00 | NDC 00078035834

## 2012-07-07 MED ADMIN — gabapentin (NEURONTIN) capsule 100 mg: ORAL | @ 13:00:00 | NDC 00904563161

## 2012-07-07 MED ADMIN — potassium chloride SR (KLOR-CON 10) tablet 20 mEq: ORAL | @ 15:00:00 | NDC 00245004189

## 2012-07-07 MED ADMIN — oxyCODONE IR (ROXICODONE) tablet 5-10 mg: ORAL | @ 15:00:00 | NDC 68084035411

## 2012-07-07 MED ADMIN — Yvonne, please let me know when husband brings Prilosec.  Thx. jml: @ 15:00:00 | NDC 00740100004

## 2012-07-07 MED ADMIN — gabapentin (NEURONTIN) capsule 100 mg: ORAL | @ 22:00:00 | NDC 68084059411

## 2012-07-07 MED ADMIN — fluticasone (FLONASE) 50 mcg/actuation nasal spray 2 Spray: NASAL | @ 20:00:00 | NDC 00054327099

## 2012-07-07 MED ADMIN — sodium chloride (NS) flush 5-10 mL: INTRAVENOUS | @ 20:00:00 | NDC 87701099893

## 2012-07-07 MED ADMIN — ascorbic acid (VITAMIN C) tablet 500 mg: ORAL | @ 22:00:00 | NDC 00409339732

## 2012-07-07 MED ADMIN — 0.9% sodium chloride with KCl 20 mEq/L infusion: INTRAVENOUS | @ 04:00:00 | NDC 00409711509

## 2012-07-07 MED ADMIN — ferrous sulfate tablet 325 mg: ORAL | @ 22:00:00

## 2012-07-07 MED ADMIN — sodium chloride (NS) flush 5-10 mL: INTRAVENOUS | @ 11:00:00 | NDC 58016499501

## 2012-07-07 MED ADMIN — ceFAZolin (ANCEF) 2g IVPB in 100 ml 0.9% NS: INTRAVENOUS | @ 07:00:00 | NDC 61553021648

## 2012-07-07 MED ADMIN — oxyCODONE IR (ROXICODONE) tablet 5-10 mg: ORAL | @ 22:00:00 | NDC 68084035411

## 2012-07-07 MED ADMIN — oxyCODONE IR (ROXICODONE) tablet 5-10 mg: ORAL | @ 19:00:00 | NDC 68084035411

## 2012-07-07 MED ADMIN — ceFAZolin (ANCEF) 2g IVPB in 100 ml 0.9% NS: INTRAVENOUS | @ 01:00:00 | NDC 61553021648

## 2012-07-07 MED ADMIN — loratadine (CLARITIN) tablet 10 mg: ORAL | @ 16:00:00 | NDC 68084024811

## 2012-07-07 MED ADMIN — senna-docusate (PERICOLACE) 8.6-50 mg per tablet 1 Tab: ORAL | @ 02:00:00 | NDC 00904564361

## 2012-07-07 MED FILL — VITAMIN C 500 MG TABLET: 500 mg | ORAL | Qty: 1

## 2012-07-07 MED FILL — LORATADINE 10 MG TAB: 10 mg | ORAL | Qty: 1

## 2012-07-07 MED FILL — THERAPEUTIC TABLET: ORAL | Qty: 1

## 2012-07-07 MED FILL — CEFAZOLIN 2 G IN 100 ML 0.9% NS: 2 gram/100 mL | INTRAVENOUS | Qty: 100

## 2012-07-07 MED FILL — INDAPAMIDE 2.5 MG TAB: 2.5 mg | ORAL | Qty: 1

## 2012-07-07 MED FILL — OFIRMEV 1,000 MG/100 ML (10 MG/ML) INTRAVENOUS SOLUTION: 1000 mg/100 mL (10 mg/mL) | INTRAVENOUS | Qty: 100

## 2012-07-07 MED FILL — ROCURONIUM 10 MG/ML IV: 10 mg/mL | INTRAVENOUS | Qty: 10

## 2012-07-07 MED FILL — NS WITH POTASSIUM CHLORIDE 20 MEQ/L IV: 20 mEq/L | INTRAVENOUS | Qty: 1000

## 2012-07-07 MED FILL — PHENYLEPHRINE 10 MG/ML INJECTION: 10 mg/mL | INTRAMUSCULAR | Qty: 1

## 2012-07-07 MED FILL — DIPRIVAN 10 MG/ML INTRAVENOUS EMULSION: 10 mg/mL | INTRAVENOUS | Qty: 20

## 2012-07-07 MED FILL — OXYCODONE 5 MG TAB: 5 mg | ORAL | Qty: 2

## 2012-07-07 MED FILL — PHARMACY INFORMATION NOTE: Qty: 1

## 2012-07-07 MED FILL — FERROUS SULFATE 325 MG (65 MG ELEMENTAL IRON) TAB: 325 mg (65 mg iron) | ORAL | Qty: 1

## 2012-07-07 MED FILL — GABAPENTIN 100 MG CAP: 100 mg | ORAL | Qty: 1

## 2012-07-07 MED FILL — VANCOMYCIN 1,000 MG IV SOLR: 1000 mg | INTRAVENOUS | Qty: 1000

## 2012-07-07 MED FILL — K-TAB 10 MEQ TABLET,EXTENDED RELEASE: 10 mEq | ORAL | Qty: 2

## 2012-07-07 MED FILL — EPHEDRINE SULFATE 50 MG/ML IJ SOLN: 50 mg/mL | INTRAMUSCULAR | Qty: 1

## 2012-07-07 MED FILL — LIDOCAINE (PF) 20 MG/ML (2 %) IJ SOLN: 20 mg/mL (2 %) | INTRAMUSCULAR | Qty: 5

## 2012-07-07 MED FILL — LACTATED RINGERS IV: INTRAVENOUS | Qty: 2000

## 2012-07-07 MED FILL — QUELICIN 20 MG/ML INJECTION SOLUTION: 20 mg/mL | INTRAMUSCULAR | Qty: 10

## 2012-07-07 MED FILL — SENNA-S 8.6 MG-50 MG TABLET: ORAL | Qty: 1

## 2012-07-07 MED FILL — DIOVAN 80 MG TABLET: 80 mg | ORAL | Qty: 1

## 2012-07-07 MED FILL — FLUTICASONE 50 MCG/ACTUATION NASAL SPRAY, SUSP: 50 mcg/actuation | NASAL | Qty: 16

## 2012-07-07 NOTE — Progress Notes (Signed)
Problem: Self Care Deficits Care Plan (Adult)  Goal: *Acute Goals and Plan of Care (Insert Text)  Occupational Therapy Goals  Initiated 07/07/2012    1. Patient will perform grooming in standingwith supervision/set-up within 7 days.  2. Patient will perform toilet transfer with modified independence using most appropriate DME within 7 days.   3. Patient will upper body dressing and lower body dressing at modified independence within 7 days.   4. Patient will don/doff back brace at supervision/set-up within 7 days.  5. Patient will verbalize/demonstrate 3/3 back precautions during ADL tasks within 7 days.   OCCUPATIONAL THERAPY EVALUATION  Patient: [name] (61 y.o. female)  Date: 07/07/2012  Primary Diagnosis: LUMBARGO, RIDICULOPATHY, STENOSIS  LUMBARGO, RIDICULOPATHY, STENOSIS  Procedure(s) (LRB):  SPINE LUMBAR LAMINECTOMY WITH INSTRUMENTATION (N/A) 1 Day Post-Op   Precautions:   Back (back brace)      ASSESSMENT :   Based on the objective data described below, the patient presents with severe pain, back precautions and back brace s/p surgery, decreased balance, general strength and endurance impairing independence and safety in ADLs and functional mobility .   At this time she moderate assistance for functional mobility and supervision to maximal assistance for self care.   Once her pain is controlled, feel she would benefit from home health OT and PT at discharge.  Will continue to assess for appropriate DME and adaptive aids for adhering to back precautions and for comfort during ADLs.    Patient will benefit from skilled intervention to address the above impairments.  Patient???s rehabilitation potential is considered to be Good  Factors which may influence rehabilitation potential include:   [ ]              None noted  [ ]              Mental ability/status  [ ]              Medical condition  [ ]              Home/family situation and support systems  [ ]              Safety awareness  [X]              Pain  tolerance/management  [ ]              Other:        PLAN :   Recommendations and Planned Interventions:  [X]                Self Care Training                  [X]         Therapeutic Activities  [X]                Functional Mobility Training    [ ]         Cognitive Retraining  [X]                Therapeutic Exercises           [X]         Endurance Activities  [X]                Balance Training                   [ ]         Neuromuscular Re-Education  [ ]                Visual/Perceptual  Training     [X]    Home Safety Training  [X]                Patient Education                 [X]         Family Training/Education  [ ]                Other (comment):    Frequency/Duration: Patient will be followed by occupational therapy 3 times a week to address goals.  Discharge Recommendations: Home Health  Further Equipment Recommendations for Discharge: tbd, may need bsc       SUBJECTIVE:   Patient stated ???Oh, I don't think I can.???  (ambulate with RW to bathroom)      OBJECTIVE DATA SUMMARY:       Past Medical History   Diagnosis Date   ??? Hypertension     ??? Autoimmune disease         connective tissue disease   ??? Other ill-defined conditions         heart mumur   ??? Other and unspecified symptoms and signs involving general sensations and perceptions         fibromyalgia/osteo arthritis   ??? GERD (gastroesophageal reflux disease)     ??? Arthritis         osteo arthritis   ??? Asthma     ??? Thyroid disease         goiter/no longer on medication   ??? Nausea & vomiting       Past Surgical History   Procedure Date   ??? Hx orthopaedic         right rotator cuff repair   ??? Hx hysterectomy     ??? Hx tubal ligation     ??? Hx other surgical         cystectomy right shoulder blade   ??? Hx other surgical         wisdom teeth extraction     Prior Level of Function/Home Situation: Pt reports independence in adls and functional mobility. She lives with her husband, is retired and enjoys Architectural technologist  Home Environment: Private residence  # Steps  to Enter: 3   One/Two Story Residence: Two story, live on 1st floor  # of Interior Steps: 12   Height of Each Step (in): 12 inches  Interior Rails: Both  Retail buyer Available: Yes  Living Alone: No  Support Systems: Spouse  Patient Expects to be Discharged to:: Private residence  Current DME Used/Available at Home: None  Tub or Shower Type: Tub/Shower combination  [X]   Right hand dominant   [ ]   Left hand dominant  Cognitive/Behavioral Status:  Neurologic State: Alert  Orientation Level: Appropriate for age  Cognition: Appropriate decision making;Appropriate for age attention/concentration;Appropriate safety awareness  Perception: Appears intact  Perseveration: No perseveration noted  Safety/Judgement: Awareness of environment;Fall prevention;Home safety;Insight into deficits  Skin: surgical bandage  Edema: none observed  Vision/Perceptual:    Tracking: Able to track stimulus in all quadrants w/o difficulty                           Corrective Lenses: Glasses  Coordination:     Fine Motor Skills-Upper: Left Intact;Right Intact    Gross Motor Skills-Upper: Left Intact;Right Intact  Balance:  Sitting: Intact;With support (painful)  Standing: Impaired  Standing - Static: Constant support;Fair  Standing -  Dynamic : Fair (using rolling walker)  Strength:  BUEs:  Within functional limits                 Tone & Sensation:  Tone is normal  Sensation is intact-no numbness or tingling                          Range of Motion:  Limited by surgery and back precautions.  Extremities within functional limits                          Functional Mobility and Transfers for ADLs:  Bed Mobility:   Moderate assistance supine to sit  Educated pt on log roll technique.    Pt reporting severe pain--recently hand pain medications per pt and nursing           Transfers:  Sit to Stand: Additional time;Assist X1;Moderate assistance;Safety concerns;Verbal cues                                      Toilet Transfer : Moderate assistance to [facility]  with and without RW                                               ADL Assessment:  Feeding: Supervision/set up    Oral Facial Hygiene/Grooming: Supervision/set up (at bed level)    Bathing: Maximum assistance    Upper Body Dressing: Minimum assistance    Lower Body Dressing: Total assistance    Toileting: Minimum assistance              ADL Intervention:   Pt educated on role of OT and OT plan of care.  Pt educated on log roll technique, back precautions-BLT, pt could state 3/3 at end of session, donning/doffing (seated)  and adjusting back brace (wall), using RW safely during functional mobility and transfers, managing pain with medications, and using adaptive aids for adls if needed.  Pt ambulated around bed to chair using the RW.  Transitional movements very painful for pt, however, she reported that her pain was decreasing by the end of tx session.                                  Cognitive Retraining  Safety/Judgement: Awareness of environment;Fall prevention;Home safety;Insight into deficits    Therapeutic Exercise:  Encouraged pt to pump ankles while seated in chair and encouraged pt to sit up for a sort time- 30 minutes, call for nurse and change positions and return to bed to rest after an hour.    Pain:  Pain Scale 1: Numeric (0 - 10)  Pain Intensity 1: 6              Activity Tolerance:   Fair.  Pt very painful, but motivated to increase her functional mobility.    After treatment:   [X]  Patient left in no apparent distress sitting up in chair  [ ]  Patient left in no apparent distress in bed  [X]  Call bell left within reach  [X]  Nursing notified  [X]  Caregiver present  [ ]  Bed alarm activated      COMMUNICATION/EDUCATION:  The patient???s plan of care was discussed with: Physical Therapist and Registered Nurse.  [X]  Home safety education was provided and the patient/caregiver indicated understanding.  [X]  Patient/family have participated as able in goal setting and plan of care.  [X]  Patient/family agree  to work toward stated goals and plan of care.  [ ]  Patient understands intent and goals of therapy, but is neutral about his/her participation.  [ ]  Patient is unable to participate in goal setting and plan of care.  This patient???s plan of care is appropriate for delegation to OTA.    Thank you for this referral.  Lambert Keto, OTR/L  Time Calculation: 39 mins

## 2012-07-07 NOTE — Progress Notes (Signed)
ORTHO POST OP SPINE PROGRESS NOTE    July 07, 2012  Admit Date: 07/06/2012  Admit Diagnosis: LUMBARGO, RIDICULOPATHY, STENOSIS  LUMBARGO, RIDICULOPATHY, STENOSIS  Procedure: Procedure(s):  L4-5 DECOMPRESSION AND FUSION  Post Op day: 1 Day Post-Op    Subjective:     [name] is a patient who has complaints of back pain and RLE numbness but overall feeling ok. is glad to have the surgery done with.     Review of Systems: Pertinent items are noted in HPI.    Objective:     PT/OT:   Distance Ambulated:           Time Ambulated (min):        Ambulation Response:        Assistive Device:              Assistive Device: Fall prevention device    Vital Signs:    Blood pressure 130/72, pulse 93, temperature 98.5 ??F (36.9 ??C), resp. rate 18, SpO2 98.00%.    Temp (24hrs), Avg:98.4 ??F (36.9 ??C), Min:97.9 ??F (36.6 ??C), Max:99.2 ??F (37.3 ??C)         09/16 1900 - 09/18 0659  In: 2243.3 [P.O.:360; I.V.:1883.3]  Out: 1500 [Urine:1300; Drains:100]    LAB:    Recent Labs   [facility] 07/07/12 0335    HGB 12.4    WBC --    PLT --       Wound/Drain Assessment:  Drain:      Dressing:     Physical Exam:  Neurological: no deficit  Incision clean, dry, and intact  5/5 strength in BLE    Assessment:      There is no problem list on file for this patient.      Plan:     Continue PT/OT/Rehab  Discontinue: IV, foley and PCA  Consult: PT  and OT    Discharge To: home v rehab       Signed By: Consuello Closs. [place], [place]

## 2012-07-07 NOTE — Progress Notes (Signed)
Physical Therapy  Referral received and chart reviewed. Attempted to see patient for PT evaluation but pt currently being taken off the floor for x-ray. Will follow up as able.   Carloyn Manner, PT, DPT

## 2012-07-07 NOTE — Progress Notes (Signed)
Pt seen and examined with Matt today.  Agree with the above assessment and plan.

## 2012-07-07 NOTE — Progress Notes (Signed)
Pharmacy: Flaxseed Oil, and Acetaminophen combination.    Per discussion with patient, she will resume Flaxseed Oil, and Acetaminophen combination upon discharge.    I removed Flaxseed Oil, and Acetaminophen combination from the patient's MAR.    Redgie Grayer, [facility]

## 2012-07-07 NOTE — Progress Notes (Signed)
Bedside and Verbal shift change report given to COURTNEY J SMITH, RN   (oncoming nurse) by Yvonne Slade RN  (offgoing nurse).  Report given with SBAR, Kardex, MAR and Recent Results.

## 2012-07-08 LAB — HEMOGLOBIN: HGB: 11.9 g/dL (ref 11.5–16.0)

## 2012-07-08 MED ADMIN — oxyCODONE IR (ROXICODONE) tablet 5-10 mg: ORAL | @ 15:00:00 | NDC 68084035411

## 2012-07-08 MED ADMIN — omeprazole (PRILOSEC) capsule 20 mg: ORAL | @ 01:00:00 | NDC 00093521110

## 2012-07-08 MED ADMIN — ferrous sulfate tablet 325 mg: ORAL | @ 13:00:00

## 2012-07-08 MED ADMIN — ascorbic acid (VITAMIN C) tablet 500 mg: ORAL | @ 13:00:00 | NDC 00409339732

## 2012-07-08 MED ADMIN — ascorbic acid (VITAMIN C) tablet 500 mg: ORAL | @ 20:00:00 | NDC 00409339732

## 2012-07-08 MED ADMIN — oxyCODONE IR (ROXICODONE) tablet 5-10 mg: ORAL | @ 18:00:00 | NDC 68084035411

## 2012-07-08 MED ADMIN — oxyCODONE IR (ROXICODONE) tablet 5-10 mg: ORAL | @ 08:00:00 | NDC 68084035411

## 2012-07-08 MED ADMIN — oxyCODONE IR (ROXICODONE) tablet 5-10 mg: ORAL | @ 01:00:00 | NDC 68084035411

## 2012-07-08 MED ADMIN — potassium chloride SR (KLOR-CON 10) tablet 20 mEq: ORAL | @ 13:00:00 | NDC 00245004189

## 2012-07-08 MED ADMIN — oxyCODONE IR (ROXICODONE) tablet 5-10 mg: ORAL | @ 12:00:00 | NDC 68084035411

## 2012-07-08 MED ADMIN — sodium chloride (NS) flush 5-10 mL: INTRAVENOUS | @ 10:00:00 | NDC 58016499501

## 2012-07-08 MED ADMIN — ferrous sulfate tablet 325 mg: ORAL | @ 20:00:00

## 2012-07-08 MED ADMIN — omeprazole (PRILOSEC) capsule 20 mg: ORAL | @ 13:00:00 | NDC 00093521110

## 2012-07-08 MED ADMIN — indapamide (LOZOL) tablet 2.5 mg: ORAL | @ 13:00:00 | NDC 51079086801

## 2012-07-08 MED ADMIN — fluticasone (FLONASE) 50 mcg/actuation nasal spray 2 Spray: NASAL | @ 13:00:00 | NDC 00054327099

## 2012-07-08 MED ADMIN — oxyCODONE IR (ROXICODONE) tablet 5-10 mg: ORAL | @ 22:00:00 | NDC 68084035411

## 2012-07-08 MED ADMIN — loratadine (CLARITIN) tablet 10 mg: ORAL | @ 13:00:00 | NDC 68084024811

## 2012-07-08 MED ADMIN — magnesium hydroxide (MILK OF MAGNESIA) oral suspension 30 mL: ORAL | @ 01:00:00 | NDC 00121043130

## 2012-07-08 MED ADMIN — gabapentin (NEURONTIN) capsule 100 mg: ORAL | @ 20:00:00 | NDC 68084059411

## 2012-07-08 MED ADMIN — senna-docusate (PERICOLACE) 8.6-50 mg per tablet 1 Tab: ORAL | @ 01:00:00 | NDC 00904564361

## 2012-07-08 MED ADMIN — therapeutic multivitamin (THERAGRAN) tablet 1 Tab: ORAL | @ 13:00:00

## 2012-07-08 MED ADMIN — gabapentin (NEURONTIN) capsule 100 mg: ORAL | @ 13:00:00 | NDC 68084059411

## 2012-07-08 MED ADMIN — oxyCODONE IR (ROXICODONE) tablet 5-10 mg: ORAL | @ 04:00:00 | NDC 68084035411

## 2012-07-08 MED ADMIN — sodium chloride (NS) flush 5-10 mL: INTRAVENOUS | @ 18:00:00 | NDC 58016499501

## 2012-07-08 MED ADMIN — sodium chloride (NS) flush 5-10 mL: INTRAVENOUS | @ 01:00:00 | NDC 87701099893

## 2012-07-08 MED ADMIN — valsartan (DIOVAN) tablet 80 mg: ORAL | @ 13:00:00 | NDC 00078035834

## 2012-07-08 MED ADMIN — gabapentin (NEURONTIN) capsule 100 mg: ORAL | @ 01:00:00 | NDC 68084059411

## 2012-07-08 MED FILL — SENNA-S 8.6 MG-50 MG TABLET: ORAL | Qty: 1

## 2012-07-08 MED FILL — DIOVAN 80 MG TABLET: 80 mg | ORAL | Qty: 1

## 2012-07-08 MED FILL — GABAPENTIN 100 MG CAP: 100 mg | ORAL | Qty: 1

## 2012-07-08 MED FILL — FERROUS SULFATE 325 MG (65 MG ELEMENTAL IRON) TAB: 325 mg (65 mg iron) | ORAL | Qty: 1

## 2012-07-08 MED FILL — LORATADINE 10 MG TAB: 10 mg | ORAL | Qty: 1

## 2012-07-08 MED FILL — K-TAB 10 MEQ TABLET,EXTENDED RELEASE: 10 mEq | ORAL | Qty: 2

## 2012-07-08 MED FILL — OXYCODONE 5 MG TAB: 5 mg | ORAL | Qty: 2

## 2012-07-08 MED FILL — VITAMIN C 500 MG TABLET: 500 mg | ORAL | Qty: 1

## 2012-07-08 MED FILL — THERAPEUTIC TABLET: ORAL | Qty: 1

## 2012-07-08 MED FILL — MILK OF MAGNESIA 400 MG/5 ML ORAL SUSPENSION: 400 mg/5 mL | ORAL | Qty: 30

## 2012-07-08 NOTE — Progress Notes (Signed)
Problem: Self Care Deficits Care Plan (Adult)  Goal: *Acute Goals and Plan of Care (Insert Text)  Occupational Therapy Goals  Initiated 07/07/2012    1. Patient will perform grooming in standingwith supervision/set-up within 7 days.  2. Patient will perform toilet transfer with modified independence using most appropriate DME within 7 days.   3. Patient will upper body dressing and lower body dressing at modified independence within 7 days.   4. Patient will don/doff back brace at supervision/set-up within 7 days.  5. Patient will verbalize/demonstrate 3/3 back precautions during ADL tasks within 7 days.   OCCUPATIONAL THERAPY TREATMENT  Patient: [name] (61 y.o. female)  Date: 07/08/2012  Diagnosis: LUMBARGO, RIDICULOPATHY, STENOSIS  LUMBARGO, RIDICULOPATHY, STENOSIS <principal problem not specified>  Procedure(s) (LRB):  SPINE LUMBAR LAMINECTOMY WITH INSTRUMENTATION (N/A) 2 Days Post-Op  Precautions: Back (back brace)      ASSESSMENT:   Pt performed lower body adl training using adaptive aids today-demonstrated and performed tasks with S to minimal assistance.  Simulated home activities during standing ADL/functional mobility tolerating 5 minutes.  Pt reported pain level of 6/10.  In sitting she was a bit drowsy at times.   Pt declined toilet transfer/bathroom mobility.  Performed bed mobility, simulating her home environment-pt requiring minimal assistance for getting BLEs into bed and verbal cues for log roll technique.  Pt provided resources for self care aids via catalog and list of area equipment companies.  Pt is progressing toward goals.  May benefit from home health OT at discharge.       Progression toward goals:  [ ]        Improving appropriately and progressing toward goals  [X]        Improving slowly and progressing toward goals  [ ]        Not making progress toward goals and plan of care will be adjusted       PLAN:   Patient continues to benefit from skilled intervention to address the above  impairments.  Continue treatment per established plan of care.  Discharge Recommendations:  Home Health OT  Further Equipment Recommendations for Discharge:  Adaptive aids for ADLs-pt provided resources today       SUBJECTIVE:   Patient stated ???It's about a 6.???    Pt's pain level during standing adls.      OBJECTIVE DATA SUMMARY:   Cognitive/Behavioral Status:  Neurologic State: Alert;Appropriate for age        Perception: Appears intact  Perseveration: No perseveration noted  Safety/Judgement: Fall prevention;Insight into deficits;Home safety;Awareness of environment  Functional Mobility and Transfers for ADLs:  Bed Mobility:  Rolling: Additional time;Assist X1;Minimal  assistance;Verbal cues;Safety concerns (log roll technique)     Sit to Supine: Additional time;Assist X1;Minimum assistance;Verbal cues;Safety concerns  Scooting: Additional time  Transfers:  Sit to Stand: Additional time;CGA                                      Toilet Transfer :  (declined)                                            Bathroom Mobility:  (pt declined)                 Balance:  Sitting: Intact;With support  Standing: Impaired  Standing -  Static: Constant support;Good  Standing - Dynamic : Fair (pain and decreased endurance-educ onsafe technique)  ADL Intervention:            Upper Body Bathing  Position Performed: Seated in chair  Cues: Tactile cues provided;Verbal cues provided;Physical assistance;Visual cues provided  Adaptive Equipment: Long handled sponge;Reacher (demonstrated compensatory techniques)    Lower Body Bathing  Perineal  :  (pt deferred trying this-will plan tomorrow)  Position Performed: Seated in chair  Cues: Tactile cues provided;Verbal cues provided;Visual cues provided  Adaptive Equipment: Long handled sponge;Reacher;Shower chair (verbally explained and demonstated)    Upper Body Dressing Assistance  Orthotics(Brace): Stand-by assistance (to doff seated EOB) per pt needs assistance for donning brace    Lower Body  Dressing Assistance  Underpants: Compensatory technique training  Pants With Elastic Waist: Compensatory technique training  Socks: Compensatory technique training  Shoes with Velcro: Compensatory technique training  Shoes with Cloth Laces: Compensatory technique training  Shoes with Elastic Laces: Compensatory technique training  Slip on Shoes with Back: Compensatory technique training  Leg Crossed Method Used: No  Position Performed: Seated in chair  Cues: Doff;Don;Physical assistance;Tactile cues provided;Verbal cues provided  Adaptive Equipment Used: Dressing stick;Reacher;Sock aid;Stool;Walker    Toileting  Toileting Assistance:  (declined toileting today-provided pt resource as requested)    Cognitive Retraining  Safety/Judgement: Fall prevention;Insight into deficits;Home safety;Awareness of environment             Therapeutic Exercises:   Encouraged pt to perform ankle pumps and gentle stretches-neck and shoulders, adhering to back precautions  Pain:  Pain Scale 1: Numeric (0 - 10)  Pain Intensity 1: 6--during standing adls   Pain Location 1: Back  Pain Orientation 1: Posterior  Pain Description 1: Aching    Activity Tolerance:   Fair.  Pain continues to limit pt.  Tx performed one hour post pain med administration and pt becoming more and more drowsy during tx session.  .  After treatment:   [ ]  Patient left in no apparent distress sitting up in chair  [X]  Patient left in no apparent distress in bed  [X]  Call bell left within reach  [X]  Nursing notified  [ ]  Caregiver present  [ ]  Bed alarm activated      COMMUNICATION/COLLABORATION:   The patient???s plan of care was discussed with: Registered Nurse    Lambert Keto, OTR/L  Time Calculation: 42 mins

## 2012-07-08 NOTE — Progress Notes (Signed)
Pt seen and examined with Matt today.  Agree with the above assessment and plan.

## 2012-07-08 NOTE — Progress Notes (Signed)
JP drain to back removed per order.  5 cc emptied from drain prior to removal.  Small dressing applied to area.  Patient tolerated well.

## 2012-07-08 NOTE — Progress Notes (Signed)
Bedside and Verbal shift change report given to  Erenest Blank RN (oncoming nurse) by Arlice Colt (offgoing nurse).  Report given with SBAR, Kardex, OR Summary, Procedure Summary, Intake/Output, MAR, Recent Results and Med Rec Status.

## 2012-07-08 NOTE — Progress Notes (Addendum)
Location: 3ORT1 - [id number]  Attn.: [name]  DOB: 22-Jun-1951 / Age: 61  MR#: [id number] / Admit#: [id number]  Pt. First Name: Shelby  Pt. Last Name: Cobb  [facility]  [address]  [place], [place]  [address]                        Case Management - Progress Note  Initial Open Date: 07/07/2012  Case Manager:    Initial Open Date:  Social Worker: [name]  Expected Date of Discharge:  Transferred From:  ECF Bed Held Until:  Bed Held By:  Power of Attorney:  POA/Guardian/Conservator Capacity:  Primary Caregiver: [name] [phone]  Living Arrangements:  Source of Income:  Payee:  Psychosocial History:  Cultural/Religious/Language Issues:  Education Level:  ADLS/Current Living Arrangements Issues: Lives at home with husband  Past Providers:  Will patient perform self care at discharge?  Anticipated Discharge Disposition Goal:  Assessment/Plan:   07/08/2012 04:23P  SW spoke with patient who states that  she is doing well.  She has four steps to get into her home and then  everything is on the first floor.  Patient chose [facility] for  Home Health and a Rolling [name] was ordered through [facility].  Referrals  were ecin'd and accepted. [name] [place], [place] xt [phone]      07/07/2012 03:04P This patient is a 61 year old female who is s/p L4-L5  posterior instrumented fusion with a transforaminal lumbar interbody  fusion. Patient was independent prior to admission and lives with her  husband.   SW will discuss discharge plan when husband is in the room.  [name] BSW, MHA xt [phone]    Resources at Discharge:  Service Providers at Discharge:  Dictating Provider:  Mayford Cobb, [name] [name]

## 2012-07-08 NOTE — Progress Notes (Signed)
ORTHO POST OP SPINE PROGRESS NOTE    July 08, 2012  Admit Date: 07/06/2012  Admit Diagnosis: LUMBARGO, RIDICULOPATHY, STENOSIS  LUMBARGO, RIDICULOPATHY, STENOSIS  Procedure: Procedure(s):  L4-5 DECOMPRESSION AND FUSION  Post Op day: 2 Days Post-Op    Subjective:     [name] is a patient who has complaints of back pain and buttock pain. no leg pain. did not work with PT yesterday?.     Review of Systems: Pertinent items are noted in HPI.    Objective:     PT/OT:   Distance Ambulated:        Distance Ambulated (ft): 20  Time Ambulated (min):     Time Ambulated (min): 10 minutes  Ambulation Response:     Ambulation Response: Fairly tolerated  Assistive Device:              Assistive Device: Fall prevention device;Walker (comment)    Vital Signs:    Blood pressure 155/78, pulse 90, temperature 99.1 ??F (37.3 ??C), resp. rate 18, SpO2 94.00%.    Temp (24hrs), Avg:99.5 ??F (37.5 ??C), Min:98.5 ??F (36.9 ??C), Max:100 ??F (37.8 ??C)      09/19 0700 - 09/19 1859  In: -   Out: 650 [Urine:650]  09/17 1900 - 09/19 0659  In: 1900 [P.O.:1800; I.V.:100]  Out: 2970 [Urine:2825; Drains:145]    LAB:    Recent Labs   Basename 07/08/12 0410    HGB 11.9    WBC --    PLT --       Wound/Drain Assessment:  Drain:      Dressing:     Physical Exam:  Neurological: no deficit  Incision clean, dry, and intact  5/5 strength in BLE    Assessment:      There is no problem list on file for this patient.      Plan:     Continue PT/OT/Rehab  Discontinue: IV  Consult: PT  and OT    Discharge To: home v rehab   pt needs to work with PT  Pain more controlled at this point  Needs to get up with PT for d/c determination    Signed By: Consuello Closs. [place], [place]

## 2012-07-08 NOTE — Progress Notes (Signed)
Report given to [employer]

## 2012-07-08 NOTE — Progress Notes (Addendum)
Problem: Mobility Impaired (Adult and Pediatric)   Goal: *Acute Goals and Plan of Care (Insert Text)   Physical Therapy Goals  Initiated 07/08/2012    1. Patient will move from supine to sit and sit to supine , scoot up and down and roll side to side in bed with modified independence within 4 days.  2. Patient will perform sit to stand with independence within 4 days.  3. Patient will ambulate with modified independence for 100 feet with the least restrictive device within 4 days.  4. Patient will ascend/descend 4 stairs with one handrail(s) with supervision/set-up within 4 days.  5. Patient will verbalize and demonstrate understanding of spinal precautions (No bending, lifting greater than 5 lbs, or twisting; log-roll technique; frequent repositioning as instructed) within 4 days.   PHYSICAL THERAPY EVALUATION   Patient: [name] 61 y.o. female)  Date: 07/08/2012  Diagnosis: LUMBARGO, RIDICULOPATHY, STENOSIS  LUMBARGO, RIDICULOPATHY, STENOSIS <principal problem not specified>  Procedure(s) (LRB):  SPINE LUMBAR LAMINECTOMY WITH INSTRUMENTATION (N/A) 2 Days Post-Op  Precautions: Back (back brace)    ASSESSMENT :   Based on the objective data described below, the patient presents with increased pain, generalized weakness, decreased activity tolerance, and impaired balance s/p surgery all limiting patients functional mobility. Pt tolerated 60 ft ambulation CGA to min assist with RW but had c/o increased back pain. Pt requires additional time for all mobility. Pt continues to require mod assist for bed mobility. Pt education provided on proper placement and adjustment of back brace. Pt able to verbalize 3/3 spinal precautions.   Pt unable to attempt stair training at this time secondary to pain. Will continue to follow and progress as able. Pt will benefit from continued skilled PT. Pt reports wanting to return home with husbands support. Pt will require home health PT.   Patient will benefit from skilled intervention  to address the above impairments.   Patient???s rehabilitation potential is considered to be Good   Factors which may influence rehabilitation potential include:   [ ]  None noted   [ ]  Mental ability/status   [ ]  Medical condition   [ ]  Home/family situation and support systems   [ ]  Safety awareness   [X]  Pain tolerance/management   [ ]  Other:      PLAN :   Recommendations and Planned Interventions:   [X]  Bed Mobility Training [ ]  Neuromuscular Re-Education   [X]  Transfer Training [ ]  Orthotic/Prosthetic Training   [X]  Gait Training [ ]  Modalities   [X]  Therapeutic Exercises [ ]  Edema Management/Control   [X]  Therapeutic Activities [X]  Patient and Family Training/Education   [ ]  Other (comment):   Frequency/Duration: Patient will be followed by physical therapy daily to address goals.   Discharge Recommendations: Home Health   Further Equipment Recommendations for Discharge: rolling walker (believe pt has device at home)      SUBJECTIVE:    Patient stated ???I really need my pain meds.???   OBJECTIVE DATA SUMMARY:      Prior Level of Function/Home Situation: Pt reports prior independence with all mobility and ADLs   Home Situation   Home Environment: Private residence   # Steps to Enter: 3   Rails to Enter: Yes   One/Two Story Residence: One story   Living Alone: (lives w/husband; daughter in law will assist)   Support Systems: Family member(s);Friends \\ neighbors;Spouse   Patient Expects to be Discharged to:: Private residence   Current DME Used/Available at Home: Dan Humphreys, rolling;Commode, bedside   Tub  or Shower Type: Shower (has seat)   Critical Behavior:   Neurologic State: Alert;Appropriate for age   Orientation Level: Oriented X4   Cognition: Appropriate decision making;Appropriate for age attention/concentration;Appropriate safety awareness;Follows commands   Safety/Judgement: Awareness of environment;Fall prevention;Home safety;Insight into deficits   Strength:   Strength: Generally decreased, functional     Tone  & Sensation:   Tone: Normal   Sensation: Intact     Range Of Motion:   AROM: Within functional limits (except where limited by spinal precautions)   Functional Mobility:   Bed Mobility:   Pt sitting in chair on arrival   Pt instructed in log roll technique   Rolling: Minimal assistance;Additional time;Verbal cues   Sit to Supine: Moderate assistance;Additional time;Verbal cues   Scooting: CGA;Additional time;Verbal cues   Transfers:   Sit to Stand: CGA;Additional time;Verbal cues   Stand to Sit: CGA;Additional time;Verbal cues     Balance:   Sitting: Intact   Standing: Impaired   Standing - Static: Fair   Standing - Dynamic : Poor   Ambulation/Gait Training:   Distance (ft): 60 Feet (ft)   Assistive Device: Walker, rolling;Gait belt;Brace/Splint (back brace)   Ambulation - Level of Assistance: Minimal assistance;CGA (assist x 1)   Gait Abnormalities: Decreased step clearance   Speed/Cadence: Pace decreased (<100 feet/min)   Step Length: Left shortened;Right shortened     Pt slow, VC to increase foot clearance, small steps, c/o increased pain   Pain:   Pain Scale 1: Numeric (0 - 10)   Pain Intensity 1: 3   Pain Location 1: Back   Pain Orientation 1: Anterior;Medial   Pain Description 1: Aching   Pain Intervention(s) 1: Medication (see MAR)   Activity Tolerance:   No s/s of VS distress   Please refer to the flowsheet for vital signs taken during this treatment.   After treatment:   [ ]  Patient left in no apparent distress sitting up in chair   [X]  Patient left in no apparent distress in bed   [X]  Call bell left within reach   [X]  Nursing notified   [ ]  Caregiver present   [ ]  Bed alarm activated   COMMUNICATION/EDUCATION:    The patient???s plan of care was discussed with: Registered Nurse.   [X]  Fall prevention education was provided and the patient/caregiver indicated understanding.   [X]  Patient/family have participated as able in goal setting and plan of care.   [X]  Patient/family agree to work toward stated goals  and plan of care.   [ ]  Patient understands intent and goals of therapy, but is neutral about his/her participation.   [ ]  Patient is unable to participate in goal setting and plan of care.   Thank you for this referral.   Carloyn Manner, PT, DPT   Time Calculation: 23 mins

## 2012-07-09 MED ADMIN — oxyCODONE IR (ROXICODONE) tablet 5-10 mg: ORAL | @ 05:00:00 | NDC 68084035411

## 2012-07-09 MED ADMIN — oxyCODONE IR (ROXICODONE) tablet 5-10 mg: ORAL | @ 19:00:00 | NDC 68084035411

## 2012-07-09 MED ADMIN — oxyCODONE IR (ROXICODONE) tablet 5-10 mg: ORAL | @ 08:00:00 | NDC 68084035411

## 2012-07-09 MED ADMIN — potassium chloride SR (KLOR-CON 10) tablet 20 mEq: ORAL | @ 13:00:00 | NDC 72789005782

## 2012-07-09 MED ADMIN — diazepam (VALIUM) tablet 5 mg: ORAL | @ 13:00:00 | NDC 51079028501

## 2012-07-09 MED ADMIN — diazepam (VALIUM) tablet 5 mg: ORAL | @ 19:00:00 | NDC 51079028501

## 2012-07-09 MED ADMIN — indapamide (LOZOL) tablet 2.5 mg: ORAL | @ 13:00:00 | NDC 51079086801

## 2012-07-09 MED ADMIN — ferrous sulfate tablet 325 mg: ORAL | @ 12:00:00

## 2012-07-09 MED ADMIN — senna-docusate (PERICOLACE) 8.6-50 mg per tablet 1 Tab: ORAL | @ 01:00:00 | NDC 00904564361

## 2012-07-09 MED ADMIN — fluticasone (FLONASE) 50 mcg/actuation nasal spray 2 Spray: NASAL | @ 13:00:00 | NDC 00054327099

## 2012-07-09 MED ADMIN — sodium chloride (NS) flush 5-10 mL: INTRAVENOUS | @ 10:00:00 | NDC 87701099893

## 2012-07-09 MED ADMIN — gabapentin (NEURONTIN) capsule 100 mg: ORAL | @ 01:00:00 | NDC 68084059411

## 2012-07-09 MED ADMIN — sodium chloride (NS) flush 5-10 mL: INTRAVENOUS | @ 03:00:00 | NDC 87701099893

## 2012-07-09 MED ADMIN — potassium chloride SR (KLOR-CON 10) tablet 20 mEq: ORAL | @ 12:00:00 | NDC 00245004189

## 2012-07-09 MED ADMIN — therapeutic multivitamin (THERAGRAN) tablet 1 Tab: ORAL | @ 12:00:00

## 2012-07-09 MED ADMIN — loratadine (CLARITIN) tablet 10 mg: ORAL | @ 13:00:00 | NDC 00121084940

## 2012-07-09 MED ADMIN — gabapentin (NEURONTIN) capsule 100 mg: ORAL | @ 12:00:00 | NDC 68084059411

## 2012-07-09 MED ADMIN — loratadine (CLARITIN) tablet 10 mg: ORAL | @ 12:00:00 | NDC 68084024811

## 2012-07-09 MED ADMIN — gabapentin (NEURONTIN) capsule 100 mg: ORAL | @ 13:00:00 | NDC 76420025990

## 2012-07-09 MED ADMIN — oxyCODONE IR (ROXICODONE) tablet 5-10 mg: ORAL | @ 01:00:00 | NDC 68084035411

## 2012-07-09 MED ADMIN — oxyCODONE IR (ROXICODONE) tablet 5-10 mg: ORAL | @ 16:00:00 | NDC 68084035411

## 2012-07-09 MED ADMIN — oxyCODONE IR (ROXICODONE) tablet 5-10 mg: ORAL | @ 12:00:00 | NDC 68084035411

## 2012-07-09 MED ADMIN — valsartan (DIOVAN) tablet 80 mg: ORAL | @ 13:00:00 | NDC 00078035834

## 2012-07-09 MED ADMIN — omeprazole (PRILOSEC) capsule 20 mg: ORAL | @ 12:00:00 | NDC 00093521110

## 2012-07-09 MED ADMIN — omeprazole (PRILOSEC) capsule 20 mg: ORAL | @ 01:00:00 | NDC 00093521110

## 2012-07-09 MED ADMIN — therapeutic multivitamin (THERAGRAN) tablet 1 Tab: ORAL | @ 13:00:00

## 2012-07-09 MED ADMIN — acetaminophen (TYLENOL) tablet 650 mg: ORAL | @ 08:00:00 | NDC 00904198261

## 2012-07-09 MED ADMIN — sodium chloride (NS) flush 5-10 mL: INTRAVENOUS | @ 01:00:00 | NDC 87701099893

## 2012-07-09 MED ADMIN — ascorbic acid (VITAMIN C) tablet 500 mg: ORAL | @ 12:00:00 | NDC 00409339732

## 2012-07-09 MED FILL — GABAPENTIN 100 MG CAP: 100 mg | ORAL | Qty: 1

## 2012-07-09 MED FILL — SALINE FLUSH INJECTION SYRINGE: INTRAMUSCULAR | Qty: 10

## 2012-07-09 MED FILL — FERROUS SULFATE 325 MG (65 MG ELEMENTAL IRON) TAB: 325 mg (65 mg iron) | ORAL | Qty: 1

## 2012-07-09 MED FILL — OXYCODONE 5 MG TAB: 5 mg | ORAL | Qty: 2

## 2012-07-09 MED FILL — K-TAB 10 MEQ TABLET,EXTENDED RELEASE: 10 mEq | ORAL | Qty: 2

## 2012-07-09 MED FILL — SENNA-S 8.6 MG-50 MG TABLET: ORAL | Qty: 1

## 2012-07-09 MED FILL — THERAPEUTIC TABLET: ORAL | Qty: 1

## 2012-07-09 MED FILL — LORATADINE 10 MG TAB: 10 mg | ORAL | Qty: 1

## 2012-07-09 MED FILL — DIAZEPAM 5 MG TAB: 5 mg | ORAL | Qty: 1

## 2012-07-09 MED FILL — VITAMIN C 500 MG TABLET: 500 mg | ORAL | Qty: 1

## 2012-07-09 MED FILL — DIOVAN 80 MG TABLET: 80 mg | ORAL | Qty: 1

## 2012-07-09 NOTE — Progress Notes (Signed)
Bedside report given to youvonne, rn who is assuming care of pt.

## 2012-07-09 NOTE — Progress Notes (Signed)
Bedside report received from jesse, rn.  Pt. Resting in bed oriented x 4 denies pain, nausea, sob. No complaints at this time. Assessment as noted.

## 2012-07-09 NOTE — Progress Notes (Signed)
Problem: Self Care Deficits Care Plan (Adult)  Goal: *Acute Goals and Plan of Care (Insert Text)  Occupational Therapy Goals  Initiated 07/07/2012    1. Patient will perform grooming in standingwith supervision/set-up within 7 days.  2. Patient will perform toilet transfer with modified independence using most appropriate DME within 7 days.   3. Patient will upper body dressing and lower body dressing at modified independence within 7 days.   4. Patient will don/doff back brace at supervision/set-up within 7 days.  5. Patient will verbalize/demonstrate 3/3 back precautions during ADL tasks within 7 days.   OCCUPATIONAL THERAPY TREATMENT  Patient: [name] (61 y.o. female)  Date: 07/09/2012  Diagnosis: LUMBARGO, RIDICULOPATHY, STENOSIS  LUMBARGO, RIDICULOPATHY, STENOSIS <principal problem not specified>  Procedure(s) (LRB):  SPINE LUMBAR LAMINECTOMY WITH INSTRUMENTATION (N/A) 3 Days Post-Op  Precautions: Back (back brace)      ASSESSMENT:   Pt agreeable to toileting and bathroom mobility.  CGA/Supervision with toilet transfer.  Pt will need to use wipes, positional changes and adaptive aids for independence in toileting-pt will check on adaptive aids using resources provided yesterday.  Pt less anxious today.  Will benefit from [facility] which has been ordered through case management.  Progression toward goals:  [ ]        Improving appropriately and progressing toward goals  [X]        Improving slowly and progressing toward goals  [ ]        Not making progress toward goals and plan of care will be adjusted       PLAN:   Patient continues to benefit from skilled intervention to address the above impairments.  Continue treatment per established plan of care.  Discharge Recommendations:  Home HealthOT,PT and 24 hour supervision provided by her husband.  Further Equipment Recommendations for Discharge:  Adaptive aids for lower body adls.       SUBJECTIVE:   Patient stated ???I don't think I can do it. I had trouble before.???     (toileting)      OBJECTIVE DATA SUMMARY:   Cognitive/Behavioral Status:  Neurologic State: Alert  Orientation Level: Appropriate for age  Cognition: Appropriate decision making;Appropriate for age attention/concentration;Appropriate safety awareness  Perception: Appears intact  Perseveration: No perseveration noted  Safety/Judgement: Fall prevention;Home safety;Insight into deficits;Awareness of environment  Functional Mobility and Transfers for ADLs:           Transfers:      toilet transfer: S to CGA  Sit to stand: S to CGA                                                                                                  Balance: good using RW     ADL Intervention:   REinforced use of adaptive aids, home safety and fall prevention.               Lower Body Bathing  Perineal  : Compensatory technique training;Maximum assistance  Position Performed: Seated on toilet;Standing  Adaptive Equipment: Walker;Wipes(personal cleansing cloth);Grab bar         Lower Body Dressing Assistance  Dressing Assistance:  (pt declined.  Is currently dependent, knows reacher)         Cognitive Retraining  Safety/Judgement: Fall prevention;Home safety;Insight into deficits;Awareness of environment             Pain:  Pain Scale 1: Numeric (0 - 10)  Pain Intensity 1: 3  Pain Location 1: Back;Buttocks  Pain Orientation 1: Lower  Pain Description 1: Aching  Pain Intervention(s) 1: Medication (see MAR)  Activity Tolerance:   good  Please refer to the flowsheet for vital signs taken during this treatment.  After treatment:   [X]  Patient left in no apparent distress sitting up in chair-left with PT  [ ]  Patient left in no apparent distress in bed  [ ]  Call bell left within reach  [ ]  Nursing notified  [ ]  Caregiver present  [ ]  Bed alarm activated      COMMUNICATION/COLLABORATION:   The patient???s plan of care was discussed with: Physical Therapist, Registered Nurse and case management    Lambert Keto, OTR/L  Time Calculation: 18 mins

## 2012-07-09 NOTE — Progress Notes (Signed)
Problem: Mobility Impaired (Adult and Pediatric)  Goal: *Acute Goals and Plan of Care (Insert Text)  1. Patient will move from supine to sit and sit to supine , scoot up and down and roll side to side in bed with modified independence within 4 days.  2. Patient will perform sit to stand with independence within 4 days.  3. Patient will ambulate with modified independence for 100 feet with the least restrictive device within 4 days.  4. Patient will ascend/descend 4 stairs with one handrail(s) with supervision/set-up within 4 days.  5. Patient will verbalize and demonstrate understanding of spinal precautions (No bending, lifting greater than 5 lbs, or twisting; log-roll technique; frequent repositioning as instructed) within 4 days.  PHYSICAL THERAPY TREATMENT  Patient: [name] 61 y.o. female)  Date: 07/09/2012  Diagnosis: LUMBARGO, RIDICULOPATHY, STENOSIS  LUMBARGO, RIDICULOPATHY, STENOSIS <principal problem not specified>  Procedure(s) (LRB):  SPINE LUMBAR LAMINECTOMY WITH INSTRUMENTATION (N/A) 3 Days Post-Op  Precautions: Back (back brace)      ASSESSMENT:   Pt progressing well with mobility and therapy goals. Pt able to tolerate distance of ambulation 100 ft with CGA progressing to supervision with use of RW. Pt steady but slow with ambulation. Pt performed 4 step stair training with CGA and increased time. Pt educated on home safety and activity progression and verbalized understanding. Pt safe to discharge home with family support and home health PT and OT.   Progression toward goals:  [X]       Improving appropriately and progressing toward goals  [ ]       Improving slowly and progressing toward goals  [ ]       Not making progress toward goals and plan of care will be adjusted       PLAN:   Patient continues to benefit from skilled intervention to address the above impairments.  Continue treatment per established plan of care.  Discharge Recommendations:  Home Health  Further Equipment Recommendations for  Discharge:  rolling walker       SUBJECTIVE:   Patient stated ???That was better than I thought.???   Referring to stairs   The patient stated 3/3 back precautions. Reviewed all 3 with patient.      OBJECTIVE DATA SUMMARY:   Critical Behavior:  Neurologic State: Alert  Orientation Level: Appropriate for age  Cognition: Appropriate decision making;Appropriate for age attention/concentration;Appropriate safety awareness  Safety/Judgement: Fall prevention;Home safety;Insight into deficits;Awareness of environment  Functional Mobility Training:  Bed Mobility:   Pt standing in bathroom with OT on arrival  Brace donned upon arrival  Transfers:   Stand to sit: Additional time, supervision           Balance:   Intact sitting; Fair standing  Ambulation/Gait Training:  Distance (ft): 100 Feet (ft)  Assistive Device: Walker, rolling;Gait belt;Brace/Splint (back brace)  Ambulation - Level of Assistance: CGA;Supervision/Set-up        Gait Abnormalities: Decreased step clearance        Base of Support: Widened     Speed/Cadence: Pace decreased (<100 feet/min)  Step Length: Left shortened;Right shortened                             Pt slow but steady  Stairs:  Number of Stairs Trained: 4   Stairs - Level of Assistance: Contact guard assistance;Additional time  Rail Use: Both    Pain:  Pain Scale 1: Numeric (0 - 10)  Pain Intensity 1: 6  Pain Location 1: Back;Buttocks  Pain Orientation 1: Lower  Pain Description 1: Aching  Pain Intervention(s) 1: Medication (see MAR)  Activity Tolerance:   No s/s of VS distress  Please refer to the flowsheet for vital signs taken during this treatment.  After treatment:   [X]   Patient left in no apparent distress sitting up in chair  [ ]   Patient left in no apparent distress in bed  [X]   Call bell left within reach  [X]   Nursing notified  [ ]   Caregiver present  [ ]   Bed alarm activated      COMMUNICATION/COLLABORATION:   The patient???s plan of care was discussed with: Occupational Therapist and  Registered Nurse    Carloyn Manner, PT, DPT   Time Calculation: 23 mins

## 2012-07-09 NOTE — Progress Notes (Signed)
1600 pt discharged to home accompanied by family with discharge instructions and prescriptions ,voiced understanding of them.in no apparent distress.

## 2012-07-09 NOTE — Progress Notes (Signed)
ORTHO POST OP SPINE PROGRESS NOTE    July 09, 2012  Admit Date: 07/06/2012  Admit Diagnosis: LUMBARGO, RIDICULOPATHY, STENOSIS  LUMBARGO, RIDICULOPATHY, STENOSIS  Procedure: Procedure(s):  L4-5 DECOMPRESSION AND FUSION  Post Op day: 3 Days Post-Op    Subjective:     [name] is a patient who has complaints back pain, legs feeling ok. buttock aching. did work with PT yesterday, ambulatiing 60'. PT rec home health. pt with 3 stairs at home. needs walker.     Review of Systems: Pertinent items are noted in HPI.    Objective:     PT/OT:   Distance Ambulated:        Distance Ambulated (ft): 20  Time Ambulated (min):     Time Ambulated (min): 10 minutes  Ambulation Response:     Ambulation Response: Fairly tolerated  Assistive Device:              Assistive Device: Walker (comment)    Vital Signs:    Blood pressure 111/77, pulse 74, temperature 97.9 ??F (36.6 ??C), resp. rate 20, SpO2 96.00%.    Temp (24hrs), Avg:98 ??F (36.7 ??C), Min:97 ??F (36.1 ??C), Max:98.8 ??F (37.1 ??C)         09/18 1900 - 09/20 0659  In: 1460 [P.O.:1460]  Out: 3795 [Urine:3750; Drains:45]    LAB:    Recent Labs   Basename 07/08/12 0410    HGB 11.9    WBC --    PLT --       Wound/Drain Assessment:  Drain:      Dressing:     Physical Exam:  Neurological: no deficit  Incision clean, dry, and intact  5/5 strength in BLE    Assessment:      There is no problem list on file for this patient.      Plan:     Continue PT/OT/Rehab  Discontinue: IV  Consult: PT  and OT    Discharge To: home, later today if clear per PT  Pt to go home with gabapentin     Signed By: Consuello Closs. [place], [place]

## 2012-07-09 NOTE — Progress Notes (Signed)
Pt seen and examined with Matt today.  Agree with the above assessment and plan.

## 2012-07-20 NOTE — Discharge Summary (Signed)
Discharge Summary      Patient ID:  [name]  [id number]  61 y.o.  [birth year]    Admit date: 07/06/2012    Discharge date and time: 07/09/2012  4:00 PM     Admitting Physician: Job Founds, MD     Discharge Physician: Mammie Russian    Admission Diagnoses: Yehuda Budd, STENOSIS  LUMBARGO, RIDICULOPATHY, STENOSIS    Discharge Diagnoses: Active Problems:   * No active hospital problems. *       Surgeon: Mammie Russian    Preoperative Medical Clearance: PCP    DVT Prophylaxis:  TED Hose                                  Plexi-Pulse    Postoperative transfusions:     none Banked PRBCs     Post Op complications: none    Hemoglobin at discharge: ___    Wound appears to be healing without any evidence of infection.     Physical Therapy started on the day following surgery and progressed to independent ambulation with the aid of a walker.  At the time of discharge, able to go up and down stairs and had understanding of precautions needed following surgery.      Discharged to: Home    Discharge instructions:  -Resume pre hospital diet             -Resume home medications per medical continuation form     -Ambulate with walker, appropriate total joint protocol  -Follow up in office as scheduled       Signed:  Job Founds, MD  07/20/2012  4:27 PM

## 2012-07-21 ENCOUNTER — Encounter

## 2012-08-05 ENCOUNTER — Encounter

## 2012-08-17 MED ADMIN — gadopentetate dimeglumine (MAGNEVIST) 10 mmol/20 mL (469.01 mg/mL) contrast solution 20 mL: INTRAVENOUS | @ 15:00:00 | NDC 50419018802

## 2012-08-18 LAB — URINALYSIS W/MICROSCOPIC
Bacteria: NEGATIVE /HPF
Bilirubin: NEGATIVE
Blood: NEGATIVE
Glucose: NEGATIVE MG/DL
Ketone: NEGATIVE MG/DL
Nitrites: NEGATIVE
Protein: NEGATIVE MG/DL
Specific gravity: 1.018 (ref 1.003–1.030)
Urobilinogen: 0.2 EU/DL (ref 0.2–1.0)
pH (UA): 5 (ref 5.0–8.0)

## 2012-08-18 LAB — ALBUMIN: Albumin: 3.5 g/dL (ref 3.5–5.0)

## 2012-08-18 LAB — TYPE & SCREEN
ABO/Rh(D): O NEG
Antibody screen: NEGATIVE

## 2012-08-18 LAB — CBC W/O DIFF
HCT: 37.4 % (ref 35.0–47.0)
HGB: 12.5 g/dL (ref 11.5–16.0)
MCH: 30.4 PG (ref 26.0–34.0)
MCHC: 33.4 g/dL (ref 30.0–36.5)
MCV: 91 FL (ref 80.0–99.0)
PLATELET: 347 10*3/uL (ref 150–400)
RBC: 4.11 M/uL (ref 3.80–5.20)
RDW: 13.2 % (ref 11.5–14.5)
WBC: 6.9 10*3/uL (ref 3.6–11.0)

## 2012-08-18 LAB — METABOLIC PANEL, BASIC
Anion gap: 8 mmol/L (ref 5–15)
BUN/Creatinine ratio: 37 — ABNORMAL HIGH (ref 12–20)
BUN: 14 MG/DL (ref 6–20)
CO2: 31 MMOL/L (ref 21–32)
Calcium: 9.3 MG/DL (ref 8.5–10.1)
Chloride: 102 MMOL/L (ref 97–108)
Creatinine: 0.38 MG/DL — ABNORMAL LOW (ref 0.45–1.15)
GFR est AA: >60 mL/min/{1.73_m2} (ref >60)
GFR est non-AA: >60 mL/min/{1.73_m2} (ref >60)
Glucose: 107 MG/DL — ABNORMAL HIGH (ref 65–100)
Potassium: 3.3 MMOL/L — ABNORMAL LOW (ref 3.5–5.1)
Sodium: 141 MMOL/L (ref 136–145)

## 2012-08-18 LAB — PTT: aPTT: 27.8 s (ref 23.0–30.0)

## 2012-08-18 LAB — PROTHROMBIN TIME + INR
INR: 1 (ref 0.9–1.1)
Prothrombin time: 10.7 s (ref 9.4–11.7)

## 2012-08-18 LAB — PREALBUMIN: Prealbumin: 21.6 mg/dL (ref 20.0–40.0)

## 2012-08-18 LAB — TYPE AND SCREEN
ABO/Rh: O NEG
Antibody Screen: NEGATIVE

## 2012-08-18 NOTE — Other (Signed)
Called Dr.Reis's office and left message on voicemail for [facility] for orders with pt's name and procedure information for consent.

## 2012-08-18 NOTE — Other (Signed)
[facility]  Preoperative Instructions        Surgery Date  08/23/12          Time of Arrival 245    1. On the day of your surgery, please report to the Surgical Services Registration Desk and sign in at your designated time. The Surgery Center is located to the right of the Emergency Room.    2. You must have someone with you to drive you home.You should not drive a car for 24 hours following surgery.    3. Do not have anything to eat or drink ( including water, gum, mints, coffee, juice) after midnight 08/22/12 . This may not apply to medications prescribed by your physician.  Please note special instructions, if applicable.  If you are currently taking Plavix, Coumadin, or other blood-thinning agents, contact your surgeon for instructions.    4. We recommend you do not drink any alcoholic beverages for 24 hours before and after your surgery.    5. Have a list of all current medications, including vitamins, herbal supplements and any other over the counter medications.  Stop Aspirin and non-steroidal anti-inflammatory drugs (I.e. Advil, Aleve) as directed by your surgeon's office. Stop all vitamins and herbal supplements seven days prior to your surgery.    6. Wear comfortable clothes.  Wear glasses instead of contacts.  Do not bring any money or jewelry. Please bring picture ID, insurance card, and any prearranged co-payment or hospital payment.  Do not wear make-up, particularly mascara, the morning of your surgery.  Do not wear nail polish, particularly if you are having foot /hand surgery.  Wear your hair loose or down, no ponytails, buns, bobby pins or clips.  All body piercings must be removed.  Please shower the night before or morning of surgery, but do not apply any lotions, powders or deodorants afterwards. Please do not shave for 48 hours prior to surgery, shaving of the face is acceptable.    7. You should understand that if you do not follow these instructions your surgery may  be cancelled.  If your physical condition changes (I.e. fever, cold or flu) please contact your surgeon as soon as possible.    8. It is important that you be on time.  If a situation occurs where you may be late, please call [phone] (OR Holding Area).    9. If you have any questions and or problems, please call [phone] (Pre-admission Testing).    10. Your surgery time may be subject to change.  You will receive a phone call the evening prior if your time changes.    11.  If having outpatient surgery, you must have someone to drive you here, stay with you during the duration of your stay, and to drive you home at time of discharge.      Special Instructions:May use and bring hospital nasonex nose spray.    MEDICATIONS TO TAKE THE MORNING OF SURGERY WITH A SIP OF WATER:gabapentin,hydromorphone if needed,loratadine,methocarbamol if needed,prilosec.      I understand a pre-operative phone call will be made to verify my surgery time.  In the event that I am not available, I give permission for a message to be left on my answering service and/or with another person?  Yes pts phone number [phone]         ___________________      ___________________  _________  (Signature of Patient)          (Witness)                              (  Date and Time)

## 2012-08-18 NOTE — Other (Signed)
Faxed request to Dr.Reis's office for orders with patients name and procedure information for consent.Faxed cbc,chem and u/a.Fax confirmed.

## 2012-08-20 LAB — VITAMIN D, 25 HYDROXY: Vitamin D 25-Hydroxy: 24.3 ng/mL — ABNORMAL LOW (ref 30–100)

## 2012-08-20 LAB — CULTURE, URINE
Colonies Counted: 20000
Colony Count: 20000

## 2012-08-21 LAB — HEMOGLOBIN A1C WITH EAG
Est. average glucose: 108 mg/dL
Hemoglobin A1c: 5.4 % (ref 4.2–6.3)

## 2012-08-22 LAB — CULTURE, MRSA

## 2012-08-22 NOTE — H&P (Signed)
HISTORY AND PHYSICAL    Subjective:     HISTORY OF PRESENT ILLNESS:  [name] is a very pleasant 61 year old lady coming in today for follow up after having an L4-5 decompression and fusion.  She had done very well in the hospital and discharged; however, about 10 days after surgery starting last Friday she began to have excruciating pain on the right lower extremity.  We had spoken with her.  We gave her a Medrol Dosepak.  She had that started.  The pain seemed to have peaked on Monday and since then has slowly improved but is not quite back to the baseline she was initially after surgery.  She has been taking Oxycodone sometimes up to 4-5 mg tablets at a time to deal with the pain.  She is also taking 300 mg of Gabapentin and she has been taking a Medrol Dosepak.      There are no active problems to display for this patient.    Past Medical History   Diagnosis Date   ??? Hypertension    ??? Autoimmune disease      connective tissue disease   ??? Other ill-defined conditions      heart mumur   ??? Other and unspecified symptoms and signs involving general sensations and perceptions      fibromyalgia/osteo arthritis   ??? GERD (gastroesophageal reflux disease)    ??? Arthritis      osteo arthritis   ??? Asthma    ??? Thyroid disease      goiter/no longer on medication   ??? Nausea & vomiting       Past Surgical History   Procedure Laterality Date   ??? Hx orthopaedic       right rotator cuff repair   ??? Hx orthopaedic       l4-l5 spinal fusion   ??? Hx hysterectomy     ??? Hx tubal ligation     ??? Hx other surgical       cystectomy right shoulder blade   ??? Hx other surgical       wisdom teeth extraction      Prior to Admission medications    Medication Sig Start Date End Date Taking? Authorizing Provider   HYDROmorphone (DILAUDID) 4 mg tablet Take 4 mg by mouth every three (3) hours as needed for Pain.   Yes Historical Provider   methocarbamol (ROBAXIN) 750 mg tablet Take 750 mg by mouth three (3) times daily as needed.   Yes Historical  Provider   gabapentin (NEURONTIN) 600 mg tablet Take 600 mg by mouth three (3) times daily.   Yes Historical Provider   docusate sodium (COLACE) 100 mg capsule Take 200 mg by mouth nightly.   Yes Historical Provider   valsartan (DIOVAN) 80 mg tablet Take 80 mg by mouth daily.   Yes Historical Provider   loratadine 10 mg cap Take 10 mg by mouth daily.   Yes Historical Provider   indapamide (LOZOL) 2.5 mg tablet Take 2.5 mg by mouth daily.   Yes Historical Provider   potassium chloride (KLOR-CON M20) 20 mEq tablet Take 20 mEq by mouth daily.   Yes Historical Provider   omeprazole (PRILOSEC) 20 mg capsule Take 20 mg by mouth two (2) times a day.   Yes Historical Provider   mometasone (NASONEX) 50 mcg/actuation nasal spray 2 Sprays daily.   Yes Historical Provider     Allergies   Allergen Reactions   ??? Adhesive Rash   ??? Dobutamine  Other (comments)     Pt had a paradoxical reaction during a stress test and dropped her heart rate and b/p.  See stress test notes in cc.   ??? Sulfa (Sulfonamide Antibiotics) Rash      History   Substance Use Topics   ??? Smoking status: Former Smoker -- 0.50 packs/day for 5 years     Quit date: 08/19/1975   ??? Smokeless tobacco: Never Used   ??? Alcohol Use: Yes      Comment: occassionally      Family History   Problem Relation Age of Onset   ??? Lung Disease Mother    ??? Hypertension Mother    ??? Heart Disease Father       Review of Systems  No bowel or bladder incontinence.    No fevers or chills.    No saddle anesthesia.     Objective:     Patient Vitals for the past 8 hrs:   BP Temp Pulse Resp SpO2 Height Weight   08/23/12 1055 143/68 mmHg 97.5 ??F (36.4 ??C) 77 18 95 % 5' 2.75" (1.594 m) 102.059 kg (225 lb)     She has a well healed incision.  No signs or symptoms of infection.  She has 5/5 strength through all key muscles of bilateral lower extremities with no weakness.      Imaging Review  I just spoke with Inocencio Homes this morning.  We have reviewed her MRI done today at [facility] at 10:30 this  morning.  The MRI shows that she has some lateral recess stenosis still present on the right at L4-L5 in the area where she has had surgery.  There is an area where the bone cut has been made for the previous laminectomy and the spike from that bone cut is still compressing the L5 nerve root which is causing her radicular pain.      Assessment:     Active Problems:    * No active hospital problems. *    [name] is a 60 year old lady with worsening right lower extremity sciatica.  She has some residual stenosis at L4-5.    We discussed the risks of surgery include but are not limited to: death, heart attack, stroke, lung injury or infection, blindness, illeus, bladder or bowel problems, bleeding, nerve injury (including numbness, pain and weakness), paralysis (which may be permanent), failure to heal, failure to fuse bone together in fusion procedures, failure of relief of symptoms, failure of relief of pain, increased pain, need for further surgery, failure or breakage of hardware, malpositioning of hardware, need to fuse or operate on additional levels determined either during or after surgery, destabilization of the spine which may require fusion or later surgery, infections which  may or may not require additional surgery, dural tears (tears of the sack holding in nerves and spinal fluid, meningitis, voice changes, vocal cord injury, blood clots, pulmonary embolus, and anesthetic complication.  Comorbidities such as obesity, smoking, rheumatoid arthritis, chronic steroid use and diabetes increases these risks.  She understands and wants to proceed.    Plan:     The various methods of treatment have been discussed with the patient and family.   After consideration of risks, benefits and other options for treatment, the patient has consented to surgical interventions (L4-5 revision decompression).  Questions were answered and Pre-op teaching was done by myself.

## 2012-08-23 ENCOUNTER — Inpatient Hospital Stay
Admit: 2012-08-23 | Discharge: 2012-08-25 | Disposition: A | Discharge status: Home Health | Payer: BLUE CROSS/BLUE SHIELD | Attending: Orthopaedic Surgery | Admitting: Orthopaedic Surgery

## 2012-08-23 DIAGNOSIS — M48061 Spinal stenosis, lumbar region without neurogenic claudication: Secondary | ICD-10-CM

## 2012-08-23 LAB — URINALYSIS W/ REFLEX CULTURE
Bacteria: NEGATIVE /HPF
Bilirubin: NEGATIVE
Blood: NEGATIVE
Glucose: NEGATIVE MG/DL
Ketone: NEGATIVE MG/DL
Leukocyte Esterase: NEGATIVE
Nitrites: NEGATIVE
Protein: NEGATIVE MG/DL
Specific gravity: 1.02 (ref 1.003–1.030)
Urobilinogen: 0.2 EU/DL (ref 0.2–1.0)
pH (UA): 5.5 (ref 5.0–8.0)

## 2012-08-23 LAB — POC CHEM8
Anion gap (POC): 17 mmol/L — ABNORMAL HIGH (ref 5–15)
BUN (POC): 15 MG/DL (ref 9–20)
CO2 (POC): 29 MMOL/L (ref 21–32)
Calcium, ionized (POC): 1.17 MMOL/L (ref 1.12–1.32)
Chloride (POC): 99 MMOL/L (ref 98–107)
Creatinine (POC): 0.8 MG/DL (ref 0.6–1.3)
GFRAA, POC: >60 mL/min/{1.73_m2} (ref >60)
GFRNA, POC: >60 mL/min/{1.73_m2} (ref >60)
Glucose (POC): 90 MG/DL (ref 65–105)
Hematocrit (POC): 36 % (ref 35.0–47.0)
Hemoglobin (POC): 12.2 GM/DL (ref 11.5–16.0)
Potassium (POC): 3.2 MMOL/L — ABNORMAL LOW (ref 3.5–5.1)
Sodium (POC): 141 MMOL/L (ref 136–145)

## 2012-08-23 MED ADMIN — HYDROmorphone (PF) 15 mg/30 ml (DILAUDID) 15 mg/30 mL (0.5 mg/mL) PCA: INTRAVENOUS | @ 22:00:00 | NDC 02420030164

## 2012-08-23 MED ADMIN — fentaNYL citrate (PF) injection 25 mcg: INTRAVENOUS | @ 22:00:00 | NDC 00641602701

## 2012-08-23 MED ADMIN — levofloxacin (LEVAQUIN) 750 mg in D5W IVPB: INTRAVENOUS | @ 22:00:00 | NDC 25021013283

## 2012-08-23 MED ADMIN — 0.9% sodium chloride with KCl 20 mEq/L 20 mEq/L infusion: INTRAVENOUS | @ 22:00:00 | NDC 00409711509

## 2012-08-23 MED ADMIN — 0.9% sodium chloride infusion: INTRAVENOUS | @ 22:00:00 | NDC 87701099893

## 2012-08-23 MED ADMIN — bacitracin 50,000 Units in sodium chloride irrigation 0.9 % 1,000 mL Irrigation: @ 21:00:00 | NDC 63323032930

## 2012-08-23 MED ADMIN — sodium chloride (NS) flush 5-10 mL: INTRAVENOUS | @ 22:00:00 | NDC 87701099893

## 2012-08-23 MED ADMIN — lactated ringers infusion: INTRAVENOUS | @ 22:00:00 | NDC 11845118709

## 2012-08-23 MED ADMIN — lactated ringers infusion: INTRAVENOUS | @ 17:00:00 | NDC 00409795309

## 2012-08-23 MED ADMIN — ondansetron (ZOFRAN) injection 4 mg: INTRAVENOUS | @ 22:00:00 | NDC 00409475503

## 2012-08-23 MED ADMIN — ceFAZolin (ANCEF) injection 2 g: INTRAVENOUS | @ 19:00:00 | NDC 00781345196

## 2012-08-23 MED ADMIN — bupivacaine 0.25% -EPINEPHrine 1:200,000 (SENSORCAINE) 0.25 %-1:200,000 injection: SUBCUTANEOUS | @ 21:00:00 | NDC 00409904201

## 2012-08-23 MED FILL — ONDANSETRON (PF) 4 MG/2 ML INJECTION: 4 mg/2 mL | INTRAMUSCULAR | Qty: 2

## 2012-08-23 MED FILL — DEXAMETHASONE SODIUM PHOSPHATE 10 MG/ML IJ SOLN: 10 mg/mL | INTRAMUSCULAR | Qty: 1

## 2012-08-23 MED FILL — CEFAZOLIN 2 G IN 100 ML 0.9% NS: 2 gram/100 mL | INTRAVENOUS | Qty: 100

## 2012-08-23 MED FILL — OFIRMEV 1,000 MG/100 ML (10 MG/ML) INTRAVENOUS SOLUTION: 1000 mg/100 mL (10 mg/mL) | INTRAVENOUS | Qty: 100

## 2012-08-23 MED FILL — LACTATED RINGERS IV: INTRAVENOUS | Qty: 1000

## 2012-08-23 MED FILL — FENTANYL CITRATE (PF) 50 MCG/ML IJ SOLN: 50 mcg/mL | INTRAMUSCULAR | Qty: 10

## 2012-08-23 MED FILL — TRANSDERM-SCOP 1 MG OVER 3 DAYS TRANSDERMAL PATCH: 1 mg over 3 days | TRANSDERMAL | Qty: 1

## 2012-08-23 MED FILL — MIDAZOLAM 1 MG/ML IJ SOLN: 1 mg/mL | INTRAMUSCULAR | Qty: 2

## 2012-08-23 MED FILL — BUMINATE 5 % INTRAVENOUS SOLUTION: 5 % | INTRAVENOUS | Qty: 250

## 2012-08-23 MED FILL — LEVOFLOXACIN IN D5W 750 MG/150 ML IV PIGGY BACK: 750 mg/150 mL | INTRAVENOUS | Qty: 150

## 2012-08-23 MED FILL — EPHEDRINE SULFATE 50 MG/ML IJ SOLN: 50 mg/mL | INTRAMUSCULAR | Qty: 1

## 2012-08-23 MED FILL — HYDROMORPHONE 15 MG/30 ML (0.5 MG/ML) IN 0.9% SOD.CHLORIDE INFUSION: 15 mg/30 mL (0.5 mg/mL) | INTRAVENOUS | Qty: 30

## 2012-08-23 MED FILL — PROPOFOL 10 MG/ML IV EMUL: 10 mg/mL | INTRAVENOUS | Qty: 200

## 2012-08-23 MED FILL — HYDROMORPHONE (PF) 2 MG/ML IJ SOLN: 2 mg/mL | INTRAMUSCULAR | Qty: 1

## 2012-08-23 MED FILL — FLUTICASONE 50 MCG/ACTUATION NASAL SPRAY, SUSP: 50 mcg/actuation | NASAL | Qty: 16

## 2012-08-23 MED FILL — NS WITH POTASSIUM CHLORIDE 20 MEQ/L IV: 20 mEq/L | INTRAVENOUS | Qty: 1000

## 2012-08-23 MED FILL — SODIUM CHLORIDE 0.9 % IV: INTRAVENOUS | Qty: 1000

## 2012-08-23 MED FILL — FENTANYL CITRATE (PF) 50 MCG/ML IJ SOLN: 50 mcg/mL | INTRAMUSCULAR | Qty: 2

## 2012-08-23 NOTE — Op Note (Signed)
Name:      [name]                                          Surgeon:        Job Founds, MD  Account #: [id number]                 Surgery Date:   08/23/2012  DOB:       [birth year]  Age:       61                           Location:                                 OPERATIVE REPORT      PREOPERATIVE DIAGNOSES  1. Spinal stenosis with claudication.  2. Sciatica.  3. Lumbago.  4. Status post lumbar fusion.    POSTOPERATIVE DIAGNOSES  1. Spinal stenosis with claudication.  2. Sciatica.  3. Lumbago.  4. Status post lumbar fusion.    PROCEDURES PERFORMED  1. L4 laminectomy, facetectomy, foraminotomy.  2. L5 laminectomy, facetectomy, foraminotomy.  3. Use of local autograft bone for spine fusion.  4. Use of allograft bone for spine fusion.  5. L4-L5 exploration of fusion with posterior spinal fusion.    SURGEON: Theodis Sato, MD    FIRST ASSISTANT: Consuello Closs. Carleton, PA-C    COMPLICATIONS: None.    ESTIMATED BLOOD LOSS: 150 mL.    COMPLICATIONS: None.    IMPLANTS: None.    SPECIMENS REMOVED: None.    ANESTHESIA: General.    INDICATIONS FOR PROCEDURE: The patient is a pleasant 61 year old lady  presenting here today for revision L4-5 decompression and fusion. She had  failed to improve with previous surgery about 6 weeks ago and has been  having worsening sciatica of the right lower extremity. At this point, she  has failed to improve with nonoperative measures and would like to proceed  with surgical intervention. We have given her warnings about the possible  complications, including but not limited to pain, scar, bleeding,  infection, nonunion, damage to surrounding structures, death, paralysis,  blindness, stroke. She understood and wanted to proceed.    DESCRIPTION OF PROCEDURE: After informed consent was obtained and the  operative site was properly marked, the patient was moved back into the  operating room and underwent general endotracheal anesthesia. She was  positioned prone on the operating room table  using the [facility] table for a  posterior frame. The arms were placed in a 90/90 position, and the knees  were gently bent with pillows. The back was cleaned with alcohol solution  and then we proceeded to prep and drape in the usual manner. A timeout was  obtained verifying that this was the correct patient, the correct surgery,  the correct site, as well as that she had received IV antibiotics within 30  minutes of the incision.    I then proceeded to ellipse the previous incision, make an incision  slightly longer, but removing any previous scar tissue. I then proceeded to  dissect down to fascia. Once I got the fascia exposed, I was able to  dissect the spinous process on both right and left sides, exposing the  lamina. I found that  there was very little bone on the posterior elements  on the left side. I proceeded to expose the left side lamina of L5 and L4  as well as the facet joint. I was also able to explore that area and verify  that the fusion had not been completed. I then proceeded to move onto the  right side, exposed that area as well as the previous laminectomy.    Once that area was totally exposed, hemostasis was obtained. I proceeded  then to bring in the operative microscope and keep it in place for the  remainder of the procedure. Under microscopic visualization, I used a Midas  Rex to widen the laminectomy medially at L4 and L5 until I found the  ligamentum flavum. I then proceeded to use a curved curette and a Kerrison  number 2 and 3 to remove the remainder of the lamina and expose the  ligamentum flavum. I proceeded to remove the ligamentum flavum as well as  any remaining overhang of the medial facet, exposing the area completely.  The nerve roots of L4 were found as well as L5, they were totally  decompressed, they were felt with a Kerrison, verifying that there was no  area of pressure in any level.    Once this was completed, I proceeded then to remove the rod from L4 and L5  on the right  side, remove the locking caps, remove the rod and test the  screws to see if the long screw at L5 on the right side was indeed causing  the nerve irritation. There was no stimulation at 20 milliamps. We did also  stimulate at L4 on the right side with no stimulation at that side. Once  this was completed, I proceeded then to check the fusion, verify that it  was not healed with gross motion at the segment. I proceeded to place the  rod back and the locking cap and final tightened  into position.    I then proceeded to decorticate the posterior elements, the lamina and the  facet on the left side, which previously did not appear to be fused. Once  that area was totally decorticated, the wound was irrigated with 3 L of  normal saline and then I placed local autograft bone from the laminectomy  on the right side in that area as well as 10 mL of Trinity allograft bone.  Once this was completed, I placed a Duragen and DuraSeal patch to prevent  further scar tissue formation on the right side. I placed 2 grams of  vancomycin powder in the wound and closed the fascia with number 1 Vicryl  figure-of-eight interrupted sutures. Then I placed a superficial drain,  closed the subcutaneous fat with a V-Loc and the skin with 2-0 Vicryl  figure-of-eight interrupted sutures, followed by closing the skin with a  2-0 running Vicryl and Dermabond. Sterile dressing was applied.    The patient was then awakened and transferred to the PACU in stable  condition.    POSTOPERATIVE PLAN: The patient is going to remain here for 1 or 2 days. We  are going to give SCDs and TED hose for DVT prophylaxis and Ancef for  infection prophylaxis.        Reviewed on 08/23/2012 8:45 PM          Job Founds, MD    cc:   Job Founds, MD        AAR/wmx; D: 08/23/2012 04:17 P; T: 08/23/2012  08:14 P; Doc# [id number]; Job#  [id number]

## 2012-08-23 NOTE — Progress Notes (Signed)
TRANSFER - IN REPORT:    Verbal report received from Elam Dutch, RN on [name]  being received from PACU to [id number]      Report consisted of patient???s Situation, Background, Assessment and   Recommendations(SBAR).     Information from the following report(s) was reviewed with the receiving nurse.    Opportunity for questions and clarification was provided.      Assessment completed upon patient???s arrival to unit and care assumed.

## 2012-08-23 NOTE — Progress Notes (Signed)
+  Post-Anesthesia Evaluation and Assessment    Patient: [name] MRN: [id number]  SSN: [id number]   Date of Birth: Jun 26, 1951  Age: 61 y.o.  Sex: female      Cardiovascular Function/Vital Signs  BP 121/65   Pulse 93   Temp(Src) 98.1 ??F (36.7 ??C)   Resp 12   Ht 5' 2.75" (1.594 m)   Wt 102.059 kg (225 lb)   BMI 40.17 kg/m2   SpO2 94%    Patient is status post Procedure(s) with comments:  RIGHT L4 AND L5 DECOMPRESSION.    Nausea/Vomiting: Controlled.    Postoperative hydration reviewed and adequate.    Pain:  Pain Scale 1: Numeric (0 - 10) (08/23/12 1715)  Pain Intensity 1: 6 (08/23/12 1715)   Managed.    Neurological Status:   Neuro (WDL): Exceptions to WDL (08/23/12 1630)   At baseline.    Mental Status and Level of Consciousness: Arousable.    Pulmonary Status:   O2 Device: Nasal cannula (08/23/12 1630)   Adequate oxygenation and airway patent.    Complications related to anesthesia: None    Post-anesthesia assessment completed. No concerns.    Signed By: Ninfa Linden, MD    August 23, 2012

## 2012-08-23 NOTE — Progress Notes (Signed)
Received patient from PACU alert oriented X 4 denies distress family at the bedside monitor dressing at back .

## 2012-08-23 NOTE — H&P (Signed)
HISTORY AND PHYSICAL    Subjective:     HISTORY OF PRESENT ILLNESS:  [name] is a very pleasant 61-year-old lady coming in today for follow up after having an L4-5 decompression and fusion.  She had done very well in the hospital and discharged; however, about 10 days after surgery starting last Friday she began to have excruciating pain on the right lower extremity.  We had spoken with her.  We gave her a Medrol Dosepak.  She had that started.  The pain seemed to have peaked on Monday and since then has slowly improved but is not quite back to the baseline she was initially after surgery.  She has been taking Oxycodone sometimes up to 4-5 mg tablets at a time to deal with the pain.  She is also taking 300 mg of Gabapentin and she has been taking a Medrol Dosepak.      There are no active problems to display for this patient.    Past Medical History   Diagnosis Date   ??? Hypertension    ??? Autoimmune disease      connective tissue disease   ??? Other ill-defined conditions      heart mumur   ??? Other and unspecified symptoms and signs involving general sensations and perceptions      fibromyalgia/osteo arthritis   ??? GERD (gastroesophageal reflux disease)    ??? Arthritis      osteo arthritis   ??? Asthma    ??? Thyroid disease      goiter/no longer on medication   ??? Nausea & vomiting       Past Surgical History   Procedure Laterality Date   ??? Hx orthopaedic       right rotator cuff repair   ??? Hx orthopaedic       l4-l5 spinal fusion   ??? Hx hysterectomy     ??? Hx tubal ligation     ??? Hx other surgical       cystectomy right shoulder blade   ??? Hx other surgical       wisdom teeth extraction      Prior to Admission medications    Medication Sig Start Date End Date Taking? Authorizing Provider   HYDROmorphone (DILAUDID) 4 mg tablet Take 4 mg by mouth every three (3) hours as needed for Pain.   Yes Historical Provider   methocarbamol (ROBAXIN) 750 mg tablet Take 750 mg by mouth three (3) times daily as needed.   Yes Historical  Provider   gabapentin (NEURONTIN) 600 mg tablet Take 600 mg by mouth three (3) times daily.   Yes Historical Provider   docusate sodium (COLACE) 100 mg capsule Take 200 mg by mouth nightly.   Yes Historical Provider   valsartan (DIOVAN) 80 mg tablet Take 80 mg by mouth daily.   Yes Historical Provider   loratadine 10 mg cap Take 10 mg by mouth daily.   Yes Historical Provider   indapamide (LOZOL) 2.5 mg tablet Take 2.5 mg by mouth daily.   Yes Historical Provider   potassium chloride (KLOR-CON M20) 20 mEq tablet Take 20 mEq by mouth daily.   Yes Historical Provider   omeprazole (PRILOSEC) 20 mg capsule Take 20 mg by mouth two (2) times a day.   Yes Historical Provider   mometasone (NASONEX) 50 mcg/actuation nasal spray 2 Sprays daily.   Yes Historical Provider     Allergies   Allergen Reactions   ??? Adhesive Rash   ??? Dobutamine   Other (comments)     Pt had a paradoxical reaction during a stress test and dropped her heart rate and b/p.  See stress test notes in cc.   ??? Sulfa (Sulfonamide Antibiotics) Rash      History   Substance Use Topics   ??? Smoking status: Former Smoker -- 0.50 packs/day for 5 years     Quit date: 08/19/1975   ??? Smokeless tobacco: Never Used   ??? Alcohol Use: Yes      Comment: occassionally      Family History   Problem Relation Age of Onset   ??? Lung Disease Mother    ??? Hypertension Mother    ??? Heart Disease Father       Review of Systems  No bowel or bladder incontinence.    No fevers or chills.    No saddle anesthesia.     Objective:     Patient Vitals for the past 8 hrs:   BP Temp Pulse Resp SpO2 Height Weight   08/23/12 1055 143/68 mmHg 97.5 ??F (36.4 ??C) 77 18 95 % 5' 2.75" (1.594 m) 102.059 kg (225 lb)     She has a well healed incision.  No signs or symptoms of infection.  She has 5/5 strength through all key muscles of bilateral lower extremities with no weakness.      Imaging Review  I just spoke with [name] this morning.  We have reviewed her MRI done today at [facility] at 10:30 this  morning.  The MRI shows that she has some lateral recess stenosis still present on the right at L4-L5 in the area where she has had surgery.  There is an area where the bone cut has been made for the previous laminectomy and the spike from that bone cut is still compressing the L5 nerve root which is causing her radicular pain.      Assessment:     Active Problems:    * No active hospital problems. *    [name] is a 61-year-old lady with worsening right lower extremity sciatica.  She has some residual stenosis at L4-5.    We discussed the risks of surgery include but are not limited to: death, heart attack, stroke, lung injury or infection, blindness, illeus, bladder or bowel problems, bleeding, nerve injury (including numbness, pain and weakness), paralysis (which may be permanent), failure to heal, failure to fuse bone together in fusion procedures, failure of relief of symptoms, failure of relief of pain, increased pain, need for further surgery, failure or breakage of hardware, malpositioning of hardware, need to fuse or operate on additional levels determined either during or after surgery, destabilization of the spine which may require fusion or later surgery, infections which  may or may not require additional surgery, dural tears (tears of the sack holding in nerves and spinal fluid, meningitis, voice changes, vocal cord injury, blood clots, pulmonary embolus, and anesthetic complication.  Comorbidities such as obesity, smoking, rheumatoid arthritis, chronic steroid use and diabetes increases these risks.  She understands and wants to proceed.    Plan:     The various methods of treatment have been discussed with the patient and family.   After consideration of risks, benefits and other options for treatment, the patient has consented to surgical interventions (L4-5 revision decompression).  Questions were answered and Pre-op teaching was done by myself.

## 2012-08-23 NOTE — Op Note (Signed)
Name:      [name], [name]                                          Surgeon:        Emmaly Leech A Dayven Linsley, MD  Account #: [id number]                 Surgery Date:   08/23/2012  DOB:       03/07/1951  Age:       61                           Location:                                 OPERATIVE REPORT      PREOPERATIVE DIAGNOSES  1. Spinal stenosis with claudication.  2. Sciatica.  3. Lumbago.  4. Status post lumbar fusion.    POSTOPERATIVE DIAGNOSES  1. Spinal stenosis with claudication.  2. Sciatica.  3. Lumbago.  4. Status post lumbar fusion.    PROCEDURES PERFORMED  1. L4 laminectomy, facetectomy, foraminotomy.  2. L5 laminectomy, facetectomy, foraminotomy.  3. Use of local autograft bone for spine fusion.  4. Use of allograft bone for spine fusion.  5. L4-L5 exploration of fusion with posterior spinal fusion.    SURGEON: Keyaan Lederman, MD    FIRST ASSISTANT: Matthew P. Carleton, PA-C    COMPLICATIONS: None.    ESTIMATED BLOOD LOSS: 150 mL.    COMPLICATIONS: None.    IMPLANTS: None.    SPECIMENS REMOVED: None.    ANESTHESIA: General.    INDICATIONS FOR PROCEDURE: The patient is a pleasant 61-year-old lady  presenting here today for revision L4-5 decompression and fusion. She had  failed to improve with previous surgery about 6 weeks ago and has been  having worsening sciatica of the right lower extremity. At this point, she  has failed to improve with nonoperative measures and would like to proceed  with surgical intervention. We have given her warnings about the possible  complications, including but not limited to pain, scar, bleeding,  infection, nonunion, damage to surrounding structures, death, paralysis,  blindness, stroke. She understood and wanted to proceed.    DESCRIPTION OF PROCEDURE: After informed consent was obtained and the  operative site was properly marked, the patient was moved back into the  operating room and underwent general endotracheal anesthesia. She was  positioned prone on the operating room table  using the Jackson table for a  posterior frame. The arms were placed in a 90/90 position, and the knees  were gently bent with pillows. The back was cleaned with alcohol solution  and then we proceeded to prep and drape in the usual manner. A timeout was  obtained verifying that this was the correct patient, the correct surgery,  the correct site, as well as that she had received IV antibiotics within 30  minutes of the incision.    I then proceeded to ellipse the previous incision, make an incision  slightly longer, but removing any previous scar tissue. I then proceeded to  dissect down to fascia. Once I got the fascia exposed, I was able to  dissect the spinous process on both right and left sides, exposing the  lamina. I found that   there was very little bone on the posterior elements  on the left side. I proceeded to expose the left side lamina of L5 and L4  as well as the facet joint. I was also able to explore that area and verify  that the fusion had not been completed. I then proceeded to move onto the  right side, exposed that area as well as the previous laminectomy.    Once that area was totally exposed, hemostasis was obtained. I proceeded  then to bring in the operative microscope and keep it in place for the  remainder of the procedure. Under microscopic visualization, I used a Midas  Rex to widen the laminectomy medially at L4 and L5 until I found the  ligamentum flavum. I then proceeded to use a curved curette and a Kerrison  number 2 and 3 to remove the remainder of the lamina and expose the  ligamentum flavum. I proceeded to remove the ligamentum flavum as well as  any remaining overhang of the medial facet, exposing the area completely.  The nerve roots of L4 were found as well as L5, they were totally  decompressed, they were felt with a Kerrison, verifying that there was no  area of pressure in any level.    Once this was completed, I proceeded then to remove the rod from L4 and L5  on the right  side, remove the locking caps, remove the rod and test the  screws to see if the long screw at L5 on the right side was indeed causing  the nerve irritation. There was no stimulation at 20 milliamps. We did also  stimulate at L4 on the right side with no stimulation at that side. Once  this was completed, I proceeded then to check the fusion, verify that it  was not healed with gross motion at the segment. I proceeded to place the  rod back and the locking cap and final tightened  into position.    I then proceeded to decorticate the posterior elements, the lamina and the  facet on the left side, which previously did not appear to be fused. Once  that area was totally decorticated, the wound was irrigated with 3 L of  normal saline and then I placed local autograft bone from the laminectomy  on the right side in that area as well as 10 mL of Trinity allograft bone.  Once this was completed, I placed a Duragen and DuraSeal patch to prevent  further scar tissue formation on the right side. I placed 2 grams of  vancomycin powder in the wound and closed the fascia with number 1 Vicryl  figure-of-eight interrupted sutures. Then I placed a superficial drain,  closed the subcutaneous fat with a V-Loc and the skin with 2-0 Vicryl  figure-of-eight interrupted sutures, followed by closing the skin with a  2-0 running Vicryl and Dermabond. Sterile dressing was applied.    The patient was then awakened and transferred to the PACU in stable  condition.    POSTOPERATIVE PLAN: The patient is going to remain here for 1 or 2 days. We  are going to give SCDs and TED hose for DVT prophylaxis and Ancef for  infection prophylaxis.        Reviewed on 08/23/2012 8:45 PM          Peni Rupard A Yanique Mulvihill, MD    cc:   Melyssa Signor A Dilan Fullenwider, MD        AAR/wmx; D: 08/23/2012 04:17 P; T: 08/23/2012   08:14 P; Doc# [id number]; Job#  [id number]

## 2012-08-23 NOTE — Brief Op Note (Signed)
BRIEF OPERATIVE NOTE    Date of Procedure: 08/23/2012   Preoperative Diagnosis: STENOSIS, ARTHRODESIS  Postoperative Diagnosis: STENOSIS, ARTHRODESIS    Procedure: Procedure(s):    Surgeon(s) and Role:     * Job Founds, MD - Primary  Anesthesia: General   Estimated Blood Loss: 150cc  Specimens: * No specimens in log *   Findings: Stenosis/unhealed fusion   Complications: None  Implants:   Implant Name Type Inv. Item Serial No. Manufacturer Lot No. LRB No. Used Action   LARGE TRINITY ELITE   [id number] MUSCULAR TRANSPLANT [id number] Right 1 Implanted   GRAFT DURA MATRX RESRB 2X2 IN -- DURAGEN 1/PK - [id number]   [id number] [employer] [id number] Right 1 Implanted

## 2012-08-23 NOTE — Progress Notes (Signed)
Ortho  Pt post op   urine in bag looks suspicious.   Cloudy appearance.  Will send UA with micro.  Levaquin one dose IV today.  Start Levaquin 750mg  po starting tomorrow.   Krista Blue [facility]

## 2012-08-24 LAB — METABOLIC PANEL, BASIC
Anion gap: 10 mmol/L (ref 5–15)
BUN/Creatinine ratio: 33 — ABNORMAL HIGH (ref 12–20)
BUN: 14 MG/DL (ref 6–20)
CO2: 29 MMOL/L (ref 21–32)
Calcium: 8.7 MG/DL (ref 8.5–10.1)
Chloride: 106 MMOL/L (ref 97–108)
Creatinine: 0.42 MG/DL — ABNORMAL LOW (ref 0.45–1.15)
GFR est AA: >60 mL/min/{1.73_m2} (ref >60)
GFR est non-AA: >60 mL/min/{1.73_m2} (ref >60)
Glucose: 121 MG/DL — ABNORMAL HIGH (ref 65–100)
Potassium: 3.7 MMOL/L (ref 3.5–5.1)
Sodium: 145 MMOL/L (ref 136–145)

## 2012-08-24 LAB — HEMOGLOBIN: HGB: 10.7 g/dL — ABNORMAL LOW (ref 11.5–16.0)

## 2012-08-24 MED ADMIN — valsartan (DIOVAN) tablet 80 mg: ORAL | @ 13:00:00 | NDC 00078035834

## 2012-08-24 MED ADMIN — potassium chloride SR (KLOR-CON 10) tablet 20 mEq: ORAL | @ 13:00:00 | NDC 00245004189

## 2012-08-24 MED ADMIN — senna-docusate (PERICOLACE) 8.6-50 mg per tablet 1 Tab: ORAL | @ 03:00:00 | NDC 00904564361

## 2012-08-24 MED ADMIN — oxyCODONE IR (ROXICODONE) tablet 5-10 mg: ORAL | @ 23:00:00 | NDC 68084035411

## 2012-08-24 MED ADMIN — oxyCODONE IR (ROXICODONE) tablet 5-10 mg: ORAL | @ 22:00:00 | NDC 68084035411

## 2012-08-24 MED ADMIN — gabapentin (NEURONTIN) capsule 600 mg: ORAL | @ 13:00:00 | NDC 68084056311

## 2012-08-24 MED ADMIN — loratadine (CLARITIN) tablet 10 mg: ORAL | @ 13:00:00 | NDC 68084024811

## 2012-08-24 MED ADMIN — docusate sodium (COLACE) capsule 200 mg: ORAL | @ 03:00:00 | NDC 62584068311

## 2012-08-24 MED ADMIN — sodium chloride (NS) flush 5-10 mL: INTRAVENOUS | @ 17:00:00 | NDC 87701099893

## 2012-08-24 MED ADMIN — sodium chloride (NS) flush 5-10 mL: INTRAVENOUS | @ 11:00:00 | NDC 58016499501

## 2012-08-24 MED ADMIN — ferrous sulfate tablet 325 mg: ORAL | @ 22:00:00

## 2012-08-24 MED ADMIN — 0.9% sodium chloride with KCl 20 mEq/L infusion: INTRAVENOUS | @ 16:00:00 | NDC 00409711509

## 2012-08-24 MED ADMIN — ascorbic acid (VITAMIN C) tablet 500 mg: ORAL | @ 22:00:00 | NDC 00409339732

## 2012-08-24 MED ADMIN — pantoprazole (PROTONIX) tablet 40 mg: ORAL | @ 22:00:00 | NDC 00008060704

## 2012-08-24 MED ADMIN — ceFAZolin (ANCEF) 2g IVPB in 100 ml 0.9% NS: INTRAVENOUS | @ 11:00:00 | NDC 99990004319

## 2012-08-24 MED ADMIN — levofloxacin (LEVAQUIN) tablet 750 mg: ORAL | @ 22:00:00 | NDC 68084048311

## 2012-08-24 MED ADMIN — pantoprazole (PROTONIX) tablet 40 mg: ORAL | @ 13:00:00 | NDC 00008060704

## 2012-08-24 MED ADMIN — oxyCODONE IR (ROXICODONE) tablet 5-10 mg: ORAL | @ 16:00:00 | NDC 68084035411

## 2012-08-24 MED ADMIN — gabapentin (NEURONTIN) capsule 600 mg: ORAL | @ 22:00:00 | NDC 68084056311

## 2012-08-24 MED ADMIN — gabapentin (NEURONTIN) capsule 600 mg: ORAL | @ 03:00:00 | NDC 68084056311

## 2012-08-24 MED ADMIN — ceFAZolin (ANCEF) 2g IVPB in 100 ml 0.9% NS: INTRAVENOUS | @ 04:00:00 | NDC 61553021648

## 2012-08-24 MED ADMIN — oxyCODONE IR (ROXICODONE) tablet 5-10 mg: ORAL | @ 19:00:00 | NDC 68084035411

## 2012-08-24 MED ADMIN — indapamide (LOZOL) tablet 2.5 mg: ORAL | @ 13:00:00 | NDC 51079086801

## 2012-08-24 MED ADMIN — sodium chloride (NS) flush 5-10 mL: INTRAVENOUS | @ 19:00:00 | NDC 87701099893

## 2012-08-24 MED FILL — OXYCODONE 5 MG TAB: 5 mg | ORAL | Qty: 2

## 2012-08-24 MED FILL — BACITRACIN 50,000 UNIT IM: 50000 unit | INTRAMUSCULAR | Qty: 50000

## 2012-08-24 MED FILL — CEFAZOLIN 2 G IN 100 ML 0.9% NS: 2 gram/100 mL | INTRAVENOUS | Qty: 100

## 2012-08-24 MED FILL — K-TAB 10 MEQ TABLET,EXTENDED RELEASE: 10 mEq | ORAL | Qty: 2

## 2012-08-24 MED FILL — NS WITH POTASSIUM CHLORIDE 20 MEQ/L IV: 20 mEq/L | INTRAVENOUS | Qty: 1000

## 2012-08-24 MED FILL — BUPIVACAINE-EPINEPHRINE (PF) 0.25 %-1:200,000 IJ SOLN: 0.25 %-1:200,000 | INTRAMUSCULAR | Qty: 30

## 2012-08-24 MED FILL — SENNA-S 8.6 MG-50 MG TABLET: ORAL | Qty: 1

## 2012-08-24 MED FILL — VITAMIN C 500 MG TABLET: 500 mg | ORAL | Qty: 1

## 2012-08-24 MED FILL — LACTATED RINGERS IV: INTRAVENOUS | Qty: 1000

## 2012-08-24 MED FILL — LIDOCAINE (PF) 20 MG/ML (2 %) IJ SOLN: 20 mg/mL (2 %) | INTRAMUSCULAR | Qty: 5

## 2012-08-24 MED FILL — GABAPENTIN 300 MG CAP: 300 mg | ORAL | Qty: 2

## 2012-08-24 MED FILL — PROTONIX 40 MG TABLET,DELAYED RELEASE: 40 mg | ORAL | Qty: 1

## 2012-08-24 MED FILL — BENZOCAINE-MENTHOL LOZENGES (USE FOR CHLORASEPTIC MAX): Qty: 5

## 2012-08-24 MED FILL — DOK 100 MG CAPSULE: 100 mg | ORAL | Qty: 2

## 2012-08-24 MED FILL — DIPRIVAN 10 MG/ML INTRAVENOUS EMULSION: 10 mg/mL | INTRAVENOUS | Qty: 40

## 2012-08-24 MED FILL — LORATADINE 10 MG TAB: 10 mg | ORAL | Qty: 1

## 2012-08-24 MED FILL — VANCOMYCIN 1,000 MG IV SOLR: 1000 mg | INTRAVENOUS | Qty: 2000

## 2012-08-24 MED FILL — ROCURONIUM 10 MG/ML IV: 10 mg/mL | INTRAVENOUS | Qty: 5

## 2012-08-24 MED FILL — SALINE FLUSH INJECTION SYRINGE: INTRAMUSCULAR | Qty: 10

## 2012-08-24 MED FILL — LEVOFLOXACIN 750 MG TAB: 750 mg | ORAL | Qty: 1

## 2012-08-24 MED FILL — FERROUS SULFATE 325 MG (65 MG ELEMENTAL IRON) TAB: 325 mg (65 mg iron) | ORAL | Qty: 1

## 2012-08-24 MED FILL — DIOVAN 80 MG TABLET: 80 mg | ORAL | Qty: 1

## 2012-08-24 MED FILL — QUELICIN 20 MG/ML INJECTION SOLUTION: 20 mg/mL | INTRAMUSCULAR | Qty: 10

## 2012-08-24 NOTE — Other (Signed)
Patient's course of treatment, hospital stay and discharge planning were discussed by the interdisciplinary team which includes, nursing, nurse manager, nurse practitioner, care management, education, quality department representative, spiritual care, rehab therapy representative, rehab liason, and pharmacy. Tubes out. Working well with therapy and brace. Home with family maybe today or tomorrow.

## 2012-08-24 NOTE — Progress Notes (Signed)
Case Management Initial Assessment    Pt is a 61yo female admitted for a R L4 and L5 Decompression. Pt lives at home with her husband, Charyl Bigger [phone] where she plans to return at d/c. Pt had back surgery in early September and did well following that. Pt was d/c home with a rolling walker and home health through [facility]. Pt has been seen by therapy and did well. Pt does not need any additional equipment. SW will follow to see if pt does need home health. If she does she would like to use [facility] again. SW will follow.  Lynford Humphrey, MSW, [place] [id number]

## 2012-08-24 NOTE — Progress Notes (Addendum)
Problem: Mobility Impaired (Adult and Pediatric)  Goal: *Acute Goals and Plan of Care (Insert Text)  Physical Therapy Goals  Initiated 08/24/2012    1. Patient will move from supine to sit and sit to supine in bed with independence within 4 days.  2. Patient will perform sit to stand with independence within 4 days.  3. Patient will ambulate with independence for 250 feet with the least restrictive device within 4 days.  4. Patient will ascend/descend 4 stairs with handrail(s) with minimal assistance/contact guard assist within 4 days.  5. Patient will verbalize and demonstrate understanding of spinal precautions (No bending, lifting greater than 5 lbs, or twisting; log-roll technique; frequent repositioning as instructed) within 4 days.  PHYSICAL THERAPY EVALUATION  SEEN 1030 TO 1110    Patient: [name] (61 y.o. female)  Date: 08/24/2012  Primary Diagnosis: STENOSIS, ARTHRODESIS  STENOSIS, ARTHRODESIS  Procedure(s) (LRB):  RIGHT L4 AND L5 DECOMPRESSION (Right) 1 Day Post-Op   Precautions: Back Precautions         ASSESSMENT :  Based on the objective data described below, the patient presents with decreased functional mobility and gait skills s/p revision of back surgery done 6 weeks ago.  Had difficulty with the brace at first, but OT was finally able to figure out how to put it back together.  Patient able to donn the brace herself.  Good sitting balance.  Supervision for transfers to RW, slow to rise.  Ambulated 150' with RW and CGA/verbal cues.  Went up/down 4 steps with bilateral rails and CGA/verbal cues.  Up in chair at end of session.  Nurse aware.  Vitals stable.    Patient will benefit from skilled intervention to address the above impairments.  Patient???s rehabilitation potential is considered to be Good  Factors which may influence rehabilitation potential include:   [x ]         None noted  [ ]          Mental ability/status  [ ]          Medical condition  [ ]          Home/family situation and support  systems  [ ]          Safety awareness  [ ]          Pain tolerance/management  [ ]          Other:        PLAN :  Recommendations and Planned Interventions:  [x ]           Bed Mobility Training             [ ]     Neuromuscular Re-Education  [x ]           Transfer Training                   [ ]     Orthotic/Prosthetic Training  [x ]           Gait Training                         [ ]     Modalities  [x ]           Therapeutic Exercises           [ ]     Edema Management/Control  [x ]           Therapeutic Activities            [  x ]    Patient and Family Training/Education  [ ]            Other (comment):    Frequency/Duration: Patient will be followed by physical therapy 1-2 times per day/4-7 days per week to address goals.  Discharge Recommendations: Home Health  Further Equipment Recommendations for Discharge: none       SUBJECTIVE:   Patient stated ???Doesn't anyone know how to put the brace together????      OBJECTIVE DATA SUMMARY:       Past Medical History   Diagnosis Date   ??? Hypertension     ??? Autoimmune disease         connective tissue disease   ??? Other ill-defined conditions         heart mumur   ??? Other and unspecified symptoms and signs involving general sensations and perceptions         fibromyalgia/osteo arthritis   ??? GERD (gastroesophageal reflux disease)     ??? Arthritis         osteo arthritis   ??? Asthma     ??? Thyroid disease         goiter/no longer on medication   ??? Nausea & vomiting       Past Surgical History   Procedure Laterality Date   ??? Hx hysterectomy       ??? Hx tubal ligation       ??? Hx other surgical           cystectomy right shoulder blade   ??? Hx other surgical           wisdom teeth extraction   ??? Hx orthopaedic           right rotator cuff repair   ??? Hx orthopaedic           l4-l5 spinal fusion   ??? Hx orthopaedic   08/23/12       RIGHT L4 AND L5 DECOMPRESSION     Prior Level of Function/Home Situation: ambulating with back brace and rolling walker   Home Situation  Home Environment: Private  residence  # Steps to Enter: 4  One/Two Story Residence: Two story, live on 1st floor  # of Interior Steps: 12  Height of Each Step (in): 8 inches  Interior Rails: Both  Retail buyer Available: No  Living Alone: No  Support Systems: Family member(s);Friends \\ neighbors;Spouse  Patient Expects to be Discharged to:: Private residence  Current DME Used/Available at Home: None  Critical Behavior:  Neurologic State: Alert;Appropriate for age  Orientation Level: Oriented X4  Cognition: Appropriate decision making;Appropriate for age attention/concentration;Appropriate safety awareness;Follows commands     Skin:  Dressing on back C/D/I  Strength:    WFL  Tone & Sensation:   Normal tone  Sensation intact                                 Range Of Motion:  WFL                          Coordination:  WFL    Functional Mobility:  Bed Mobility:  Min assist/verbal cues for log roll and push to sit    Transfers:  Supervision/verbal cues    Balance:   Good sitting at bedside  Good standing and during gait with brace and rolling walker  Ambulation/Gait Training:  Ambulated 150' with RW and CGA/verbal cues.    Stairs:  Went up/down 4 steps with bilateral rails and CGA/verbal cues                                                                    Pain:  Pain Scale 1: Numeric (0 - 10)  Pain Intensity 1: 5  Pain Location 1: Back  Pain Intervention(s) 1: Medication per nurse  Activity Tolerance:   Tolerated PT evaluation/treatment session well once we got the brace issue solved.  She was upset prior to that.  Please refer to the flowsheet for vital signs taken during this treatment.  After treatment:   [X]          Patient left in no apparent distress sitting up in chair  [ ]          Patient left in no apparent distress in bed  [X]          Call bell left within reach  [X]          Nursing notified Nicholaus Bloom)  [ ]          Caregiver present  [ ]          Bed alarm activated      COMMUNICATION/EDUCATION:   The patient???s plan of care was  discussed with: Occupational Therapist and Registered Nurse.  [X]          Fall prevention education was provided and the patient/caregiver indicated understanding.  [ ]          Patient/family have participated as able in goal setting and plan of care.  [X]          Patient/family agree to work toward stated goals and plan of care.  [ ]          Patient understands intent and goals of therapy, but is neutral about his/her participation.  [ ]          Patient is unable to participate in goal setting and plan of care.    Thank you for this referral.  Festus Holts, PT   Time Calculation: 40 mins

## 2012-08-24 NOTE — Progress Notes (Signed)
Problem: Mobility Impaired (Adult and Pediatric)  Goal: *Acute Goals and Plan of Care (Insert Text)  Physical Therapy Goals  Initiated 08/24/2012    1. Patient will move from supine to sit and sit to supine in bed with independence within 4 days.  2. Patient will perform sit to stand with independence within 4 days.  3. Patient will ambulate with independence for 250 feet with the least restrictive device within 4 days.  4. Patient will ascend/descend 4 stairs with handrail(s) with minimal assistance/contact guard assist within 4 days.  5. Patient will verbalize and demonstrate understanding of spinal precautions (No bending, lifting greater than 5 lbs, or twisting; log-roll technique; frequent repositioning as instructed) within 4 days.  PHYSICAL THERAPY TREATMENT  SEEN 1520 TO 1441    Patient: [name] (61 y.o. female)  Date: 08/24/2012  Precautions: Spinal;Other (comment) (LS brace )      ASSESSMENT:  Patient seen for pm PT session.  Moving much better this afternoon.  Donned her brace.  CGA/verbal cues for transfer to RW.  Ambulated 125' with RW and CGA/verbal cues.  Went up/down 4 steps with bilateral rails and CGA/verbal cues.  Transport here to take to x-ray so stopped session at this point.    Progression toward goals:  [X]       Improving appropriately and progressing toward goals  [ ]       Improving slowly and progressing toward goals  [ ]       Not making progress toward goals and plan of care will be adjusted       PLAN:  Patient continues to benefit from skilled intervention to address the above impairments.  Continue treatment per established plan of care.  Discharge Recommendations:  Home Health  Further Equipment Recommendations for Discharge:  none       SUBJECTIVE:   Patient stated ???I feel better this afternoon.???   The patient stated 3/3 back precautions. Reviewed all 3 with patient.      OBJECTIVE DATA SUMMARY:   Functional Mobility Training:  Bed Mobility:  Log Rolling: Supervision;Additional time  (log roll technique)  Supine to Sit: Minimal assistance;Additional time (log roll technique)     Brace donned with  supervision/set-up   Transfers:  Sit to Stand: CGA;Additional time  Stand to Sit: CGA;Additional time        Bed to Chair: CGA;Additional time (with rolling walker for support)                    Ambulation/Gait Training:  Ambulated 125' with RW and CGA/verbal cues.  Slow steady pace.  Didn't walk as far as in am as transport here to take to x-ray.                                                                   Stairs:  Up/down 4 with bilateral rails and CGA/verbal cues.      Pain:  Pain Scale 1: Numeric (0 - 10)  Pain Intensity 1: 5   Pain Location 1: Back      Activity Tolerance:   Tolerated PT treatment session well.  Please refer to the flowsheet for vital signs taken during this treatment.  After treatment:   [X]   Patient left in no apparent  distress sitting up in wheelchair to go for x-ray  [ ]   Patient left in no apparent distress in bed  [ ]   Call bell left within reach  [X]   Nursing notified  [ ]   Caregiver present  [ ]   Bed alarm activated      COMMUNICATION/COLLABORATION:   The patient???s plan of care was discussed with: Registered Nurse    Festus Holts, PT   Time Calculation: 21 mins

## 2012-08-24 NOTE — Progress Notes (Addendum)
ORTHO POST OP SPINE PROGRESS NOTE    August 24, 2012  Admit Date: 08/23/2012  Admit Diagnosis: STENOSIS, ARTHRODESIS  STENOSIS, ARTHRODESIS  Procedure: Procedure(s):    Post Op day: 1 Day Post-Op    Subjective:     [name] is a patient who has complaints of back pain, no c/o of leg pain. feeling better post op than last visit. urine with turbid appearance post op, UA and levaquin ordered. .     Review of Systems: Pertinent items are noted in HPI.    Objective:     PT/OT:   Distance Ambulated:           Time Ambulated (min):        Ambulation Response:        Assistive Device:                   Vital Signs:    Blood pressure 99/56, pulse 79, temperature 98.5 ??F (36.9 ??C), resp. rate 20, height 5' 2.75" (1.594 m), weight 102.059 kg (225 lb), SpO2 97.00%.    Temp (24hrs), Avg:98 ??F (36.7 ??C), Min:97.5 ??F (36.4 ??C), Max:98.5 ??F (36.9 ??C)         11/03 1900 - 11/05 0659  In: 2345.4 [P.O.:440; I.V.:1900]  Out: 1465 [Urine:1400; Drains:65]    LAB:    Recent Labs      08/24/12   0450   HGB  10.7*       Wound/Drain Assessment:  Drain:      Dressing:     Physical Exam:  Neurological: no deficit  Incision clean, dry, and intact  5/5 strength in BLE    Assessment:      There is no problem list on file for this patient.    UA showing turbid appearance with 3+ crystals but no infection  Plan:     Continue PT/OT/Rehab  Discontinue: foley, drain (Back) and PCA  Consult: PT  and OT  Will stop levaquin  Low K+ post op.   Will reorder levels  Discharge To: Home wih HH. possibly today pending progress with PT       Signed By: Consuello Closs. [place], [place]

## 2012-08-24 NOTE — Progress Notes (Signed)
Problem: Self Care Deficits Care Plan (Adult)  Goal: *Acute Goals and Plan of Care (Insert Text)  Occupational Therapy Goals:  Initiated 08/24/2012  1. Patient will perform grooming standing with modified independence within 7 days.   2. Patient will perform toileting with modified independence within 7 days.   3. Patient will perform upper body dressing and lower body dressing with modified independence within 7 days.  4. Patient will transfer from toilet with modified independence using the least restrictive device and appropriate durable medical equipment within 7 day.   OCCUPATIONAL THERAPY EVALUATION  Patient: [name] (61 y.o. female)  Date: 08/24/2012  Primary Diagnosis: STENOSIS, ARTHRODESIS  STENOSIS, ARTHRODESIS  Procedure(s) (LRB):  RIGHT L4 AND L5 DECOMPRESSION (Right) 1 Day Post-Op   Precautions:   Spinal;Other (comment) (LS brace )      ASSESSMENT :  Based on the objective data described below, the patient presents with decreased balance with dynamic standing tasks and needs CGA overall for mobility and Standing ADLs.  Pt is familiar with LS precautions due to recent LS surgery.  Pt did not report any sciatica pain.  She needed verbal cues on how to don brace and has hip kit at home to assist with LB ADLS.  She may only need a few sessions with OT to regain her independence with ADLs.      Patient will benefit from skilled intervention to address the above impairments.  Patient???s rehabilitation potential is considered to be Fair  Factors which may influence rehabilitation potential include:   [X]              None noted  [ ]              Mental ability/status  [ ]              Medical condition  [ ]              Home/family situation and support systems  [ ]              Safety awareness  [ ]              Pain tolerance/management  [ ]              Other:        PLAN :  Recommendations and Planned Interventions:  [ ]                Self Care Training                  [ ]         Therapeutic Activities  [ ]                 Functional Mobility Training    [ ]         Cognitive Retraining  [ ]                Therapeutic Exercises           [ ]         Endurance Activities  [ ]                Balance Training                   [ ]         Neuromuscular Re-Education  [ ]                Visual/Perceptual Training     [ ]    Home Safety Training  [ ]   Patient Education                 [ ]         Family Training/Education  [ ]                Other (comment):    Frequency/Duration: Patient will be followed by occupational therapy 3-5 times a week to address goals.  Discharge Recommendations: home with family support  Further Equipment Recommendations for Discharge: none       SUBJECTIVE:   Patient stated ???I have had a rough morning.???      OBJECTIVE DATA SUMMARY:       Past Medical History   Diagnosis Date   ??? Hypertension     ??? Autoimmune disease         connective tissue disease   ??? Other ill-defined conditions         heart mumur   ??? Other and unspecified symptoms and signs involving general sensations and perceptions         fibromyalgia/osteo arthritis   ??? GERD (gastroesophageal reflux disease)     ??? Arthritis         osteo arthritis   ??? Asthma     ??? Thyroid disease         goiter/no longer on medication   ??? Nausea & vomiting       Past Surgical History   Procedure Laterality Date   ??? Hx hysterectomy       ??? Hx tubal ligation       ??? Hx other surgical           cystectomy right shoulder blade   ??? Hx other surgical           wisdom teeth extraction   ??? Hx orthopaedic           right rotator cuff repair   ??? Hx orthopaedic           l4-l5 spinal fusion   ??? Hx orthopaedic   08/23/12       RIGHT L4 AND L5 DECOMPRESSION     Prior Level of Function/Home Situation: ambulated with rolling walker; performed ADLs with toileting aid and hip kit due to recent back surgery  Home Situation  Home Environment: Private residence  # Steps to Enter: 4  One/Two Story Residence: Two story, live on 1st floor  # of Interior Steps: 12  Height of  Each Step (in): 8 inches  Interior Rails: Both  Retail buyer Available: No  Living Alone: No  Support Systems: Family member(s);Friends \\ neighbors;Spouse  Patient Expects to be Discharged to:: Private residence  Current DME Used/Available at Home: Dan Humphreys, rollator;Commode, bedside;Other (comment) (reacher, sock aid; toileting aide for BM clean up)  [X]   Right hand dominant   [ ]   Left hand dominant  Cognitive/Behavioral Status:  Neurologic State: Alert  Orientation Level: Oriented X4  Cognition: Appropriate decision making;Follows commands  Perception: Appears intact  Perseveration: No perseveration noted  Safety/Judgement: Awareness of environment;Insight into deficits    Vision/Perceptual:    Tracking: Able to track stimulus in all quadrants w/o difficulty                      Acuity: Within Defined Limits    Corrective Lenses: Glasses  Coordination:  Coordination: Within functional limits  Fine Motor Skills-Upper: Left Intact;Right Intact    Gross Motor Skills-Upper: Left Intact;Right Intact  Balance:  Sitting: Intact  Standing: Impaired  Standing - Static: Good  Standing - Dynamic : Fair (hands off walker in standing)  Strength:    Strength: Generally decreased, functional              Tone & Sensation:    Tone: Normal  Sensation: Intact                    Range of Motion:    AROM: Generally decreased, functional  PROM: Within functional limits                    Functional Mobility and Transfers for ADLs:  Bed Mobility:  Rolling: Supervision;Additional time (log roll technique)  Supine to Sit: Minimal assistance;Additional time (log roll technique)     Scooting: CGA;Additional time  Transfers:  Sit to Stand: CGA;Additional time     Bed to Chair: CGA;Additional time (with rolling walker for support)                                Toilet Transfer : Contact guard assistance (bedside commode over toilet)                                               ADL Assessment:  Feeding: Independent    Oral Facial  Hygiene/Grooming: Contact guard assistance (standing to wipe hands)    Bathing: Moderate assistance (for LE but has hip kit)    Upper Body Dressing: Moderate assistance (don brace seated; educated to stand next to wall to adjust)    Lower Body Dressing: Moderate assistance (but has hip kit to use)    Toileting: Minimum assistance (uses toileting aid for rear peri care)              ADL Intervention:  Verbally educated pt on LS precautions.  Pt is familiar with precautions due to previous recent back surgery.   Cognitive Retraining  Safety/Judgement: Awareness of environment;Insight into deficits      Pain:  Pain Scale 1: Numeric (0 - 10)  Pain Intensity 1: 5  Pain Location 1: Back        Pain Intervention(s) 1: Encouraged PCA  Activity Tolerance:   VSS  Please refer to the flowsheet for vital signs taken during this treatment.  After treatment:   [X]  Patient left in no apparent distress sitting up in chair  [ ]  Patient left in no apparent distress in bed  [X]  Call bell left within reach  [X]  Nursing notified  [X]  Caregiver present  [ ]  Bed alarm activated      COMMUNICATION/EDUCATION:   The patient???s plan of care was discussed with: Physical Therapist, Registered Nurse and patient.  [X]  Home safety education was provided and the patient/caregiver indicated understanding.  [X]  Patient have participated as able in goal setting and plan of care.  [X]  Patient agree to work toward stated goals and plan of care.  [ ]  Patient understands intent and goals of therapy, but is neutral about his/her participation.  [ ]  Patient is unable to participate in goal setting and plan of care.  This patient???s plan of care is appropriate for delegation to OTA.    Thank you for this referral.  Arlys John, OTR/L  Time Calculation: 12 mins

## 2012-08-24 NOTE — Progress Notes (Signed)
Attended Interdisciplinary Rounds where patient care was discussed.   Jomar Denz L. Dockum, Chaplain, D.Min, M.A.

## 2012-08-25 LAB — CULTURE, URINE
Colonies Counted: <1000
Colony Count: <1000
Culture result:: NO GROWTH
Culture: NO GROWTH

## 2012-08-25 LAB — HEMOGLOBIN: HGB: 9.9 g/dL — ABNORMAL LOW (ref 11.5–16.0)

## 2012-08-25 MED ADMIN — pantoprazole (PROTONIX) tablet 40 mg: ORAL | @ 11:00:00 | NDC 00008060704

## 2012-08-25 MED ADMIN — gabapentin (NEURONTIN) capsule 600 mg: ORAL | @ 14:00:00 | NDC 68084056311

## 2012-08-25 MED ADMIN — ferrous sulfate tablet 325 mg: ORAL | @ 14:00:00

## 2012-08-25 MED ADMIN — oxyCODONE IR (ROXICODONE) tablet 10-20 mg: ORAL | @ 11:00:00 | NDC 68084035411

## 2012-08-25 MED ADMIN — docusate sodium (COLACE) capsule 200 mg: ORAL | @ 02:00:00 | NDC 62584068311

## 2012-08-25 MED ADMIN — sodium chloride (NS) flush 5-10 mL: INTRAVENOUS | @ 11:00:00 | NDC 87701099893

## 2012-08-25 MED ADMIN — acetaminophen (TYLENOL) tablet 650 mg: ORAL | @ 05:00:00 | NDC 00904198261

## 2012-08-25 MED ADMIN — cyclobenzaprine (FLEXERIL) tablet 10 mg: ORAL | @ 08:00:00 | NDC 68084039711

## 2012-08-25 MED ADMIN — oxyCODONE IR (ROXICODONE) tablet 10-20 mg: ORAL | @ 14:00:00 | NDC 68084035411

## 2012-08-25 MED ADMIN — acetaminophen (TYLENOL) tablet 650 mg: ORAL | @ 17:00:00 | NDC 00904198261

## 2012-08-25 MED ADMIN — docusate sodium (COLACE) capsule 200 mg: ORAL | @ 03:00:00 | NDC 57664011208

## 2012-08-25 MED ADMIN — oxyCODONE IR (ROXICODONE) tablet 10-20 mg: ORAL | @ 08:00:00 | NDC 68084035411

## 2012-08-25 MED ADMIN — cyclobenzaprine (FLEXERIL) tablet 10 mg: ORAL | @ 14:00:00 | NDC 68084039711

## 2012-08-25 MED ADMIN — oxyCODONE IR (ROXICODONE) tablet 10-20 mg: ORAL | @ 05:00:00 | NDC 68084035411

## 2012-08-25 MED ADMIN — gabapentin (NEURONTIN) capsule 600 mg: ORAL | @ 03:00:00 | NDC 76420004790

## 2012-08-25 MED ADMIN — oxyCODONE IR (ROXICODONE) tablet 10-20 mg: ORAL | @ 02:00:00 | NDC 68084035411

## 2012-08-25 MED ADMIN — acetaminophen (TYLENOL) tablet 650 mg: ORAL | @ 11:00:00 | NDC 00904198261

## 2012-08-25 MED ADMIN — ascorbic acid (VITAMIN C) tablet 500 mg: ORAL | @ 14:00:00 | NDC 00409339732

## 2012-08-25 MED ADMIN — sodium chloride (NS) flush 5-10 mL: INTRAVENOUS | @ 02:00:00 | NDC 87701099893

## 2012-08-25 MED ADMIN — gabapentin (NEURONTIN) capsule 600 mg: ORAL | @ 02:00:00 | NDC 68084056311

## 2012-08-25 MED ADMIN — sodium chloride (NS) flush 5-10 mL: INTRAVENOUS | @ 03:00:00 | NDC 87701099893

## 2012-08-25 MED ADMIN — potassium chloride SR (KLOR-CON 10) tablet 20 mEq: ORAL | @ 14:00:00 | NDC 00245004189

## 2012-08-25 MED ADMIN — loratadine (CLARITIN) tablet 10 mg: ORAL | @ 14:00:00 | NDC 68084024811

## 2012-08-25 MED ADMIN — diazepam (VALIUM) tablet 5 mg: ORAL | @ 17:00:00 | NDC 68084035911

## 2012-08-25 MED ADMIN — oxyCODONE IR (ROXICODONE) tablet 10-20 mg: ORAL | @ 20:00:00 | NDC 68084035411

## 2012-08-25 MED ADMIN — senna-docusate (PERICOLACE) 8.6-50 mg per tablet 1 Tab: ORAL | @ 02:00:00 | NDC 00904564361

## 2012-08-25 MED ADMIN — diazepam (VALIUM) tablet 5 mg: ORAL | @ 05:00:00 | NDC 68084035911

## 2012-08-25 MED ADMIN — oxyCODONE IR (ROXICODONE) tablet 10-20 mg: ORAL | @ 17:00:00 | NDC 68084035411

## 2012-08-25 MED ADMIN — diazepam (VALIUM) tablet 5 mg: ORAL | @ 11:00:00 | NDC 68084035911

## 2012-08-25 MED ADMIN — senna-docusate (PERICOLACE) 8.6-50 mg per tablet 1 Tab: ORAL | @ 03:00:00 | NDC 00039000210

## 2012-08-25 MED ADMIN — pantoprazole (PROTONIX) tablet 40 mg: ORAL | @ 13:00:00 | NDC 82009001190

## 2012-08-25 MED FILL — MAPAP (ACETAMINOPHEN) 325 MG TABLET: 325 mg | ORAL | Qty: 2

## 2012-08-25 MED FILL — OXYCODONE 5 MG TAB: 5 mg | ORAL | Qty: 4

## 2012-08-25 MED FILL — DIAZEPAM 5 MG TAB: 5 mg | ORAL | Qty: 1

## 2012-08-25 MED FILL — CYCLOBENZAPRINE 10 MG TAB: 10 mg | ORAL | Qty: 1

## 2012-08-25 MED FILL — LEVOFLOXACIN 750 MG TAB: 750 mg | ORAL | Qty: 1

## 2012-08-25 MED FILL — GABAPENTIN 300 MG CAP: 300 mg | ORAL | Qty: 2

## 2012-08-25 MED FILL — MILK OF MAGNESIA 400 MG/5 ML ORAL SUSPENSION: 400 mg/5 mL | ORAL | Qty: 30

## 2012-08-25 MED FILL — VITAMIN C 500 MG TABLET: 500 mg | ORAL | Qty: 1

## 2012-08-25 MED FILL — SENNA-S 8.6 MG-50 MG TABLET: ORAL | Qty: 1

## 2012-08-25 MED FILL — FERROUS SULFATE 325 MG (65 MG ELEMENTAL IRON) TAB: 325 mg (65 mg iron) | ORAL | Qty: 1

## 2012-08-25 MED FILL — K-TAB 10 MEQ TABLET,EXTENDED RELEASE: 10 mEq | ORAL | Qty: 2

## 2012-08-25 MED FILL — DOK 100 MG CAPSULE: 100 mg | ORAL | Qty: 2

## 2012-08-25 MED FILL — PROTONIX 40 MG TABLET,DELAYED RELEASE: 40 mg | ORAL | Qty: 1

## 2012-08-25 MED FILL — LORATADINE 10 MG TAB: 10 mg | ORAL | Qty: 1

## 2012-08-25 MED FILL — SALINE FLUSH INJECTION SYRINGE: INTRAMUSCULAR | Qty: 10

## 2012-08-25 NOTE — Discharge Summary (Signed)
Ortho Discharge Summary    Patient ID:  [name]  [id number]  female  61 y.o.  April 12, 1951    Admit date: 08/23/2012    Discharge date: 08/25/2012    Admitting Physician: Job Founds, MD     Consulting Physician(s):   Treatment Team: Attending Provider: Job Founds, MD    Date of Surgery:   08/23/2012     Preoperative Diagnosis:  STENOSIS, ARTHRODESIS    Postoperative Diagnosis:   STENOSIS, ARTHRODESIS    Procedure(s):     RIGHT L4 AND L5 DECOMPRESSION     Anesthesia Type:   General     Surgeon: Job Founds, MD                            HPI:  Pt is a 61 y.o. female who has a history of STENOSIS, ARTHRODESIS  with pain and limitations of activities of daily living who presents at this time for a L4-5 PSF following the failure of conservative management.    PMH:   Past Medical History   Diagnosis Date   ??? Hypertension    ??? Autoimmune disease      connective tissue disease   ??? Other ill-defined conditions      heart mumur   ??? Other and unspecified symptoms and signs involving general sensations and perceptions      fibromyalgia/osteo arthritis   ??? GERD (gastroesophageal reflux disease)    ??? Arthritis      osteo arthritis   ??? Asthma    ??? Thyroid disease      goiter/no longer on medication   ??? Nausea & vomiting        Medications upon admission :   Prior to Admission Medications   Medication Last Dose Informant Patient Reported? Taking?   HYDROmorphone (DILAUDID) 4 mg tablet 08/23/2012 at 0700  Yes Yes   Take 4 mg by mouth every three (3) hours as needed for Pain.   methocarbamol (ROBAXIN) 750 mg tablet 08/23/2012 at 0700  Yes Yes   Take 750 mg by mouth three (3) times daily as needed.   gabapentin (NEURONTIN) 600 mg tablet 08/23/2012 at 0700  Yes Yes   Take 600 mg by mouth three (3) times daily.   docusate sodium (COLACE) 100 mg capsule 08/22/2012 at 1900  Yes Yes   Take 200 mg by mouth nightly.   valsartan (DIOVAN) 80 mg tablet 08/22/2012 at 0637  Yes Yes   Take 80 mg by mouth daily.   loratadine 10 mg cap 08/22/2012 at  0637  Yes Yes   Take 10 mg by mouth daily.   indapamide (LOZOL) 2.5 mg tablet 08/22/2012 at 0637  Yes Yes   Take 2.5 mg by mouth daily.   potassium chloride (KLOR-CON M20) 20 mEq tablet 08/22/2012 at 0637  Yes Yes   Take 20 mEq by mouth daily.   omeprazole (PRILOSEC) 20 mg capsule 08/23/2012 at 0700  Yes Yes   Take 20 mg by mouth two (2) times a day.   mometasone (NASONEX) 50 mcg/actuation nasal spray 08/22/2012 at 0637  Yes Yes   2 Sprays daily.           Allergies:    Allergies   Allergen Reactions   ??? Adhesive Rash   ??? Dobutamine Other (comments)     Pt had a paradoxical reaction during a stress test and dropped her heart rate and b/p.  See stress  test notes in cc.   ??? Sulfa (Sulfonamide Antibiotics) Rash        Hospital Course:  The patient underwent surgery.  Complications:  None; patient tolerated the procedure well. Was taken to the PACU in stable condition and then transferred to the ortho floor.      Perioperative Antibiotics:  Ancef     Postoperative Pain Management:  Oxycodone      Postoperative transfusions:    Number of units banked PRBCs =   none     Post Op complications: none    Hemoglobin at discharge:    Lab Results   Component Value Date/Time    HGB 9.9* 08/25/2012  2:39 AM    INR 1.0 08/18/2012  3:46 PM       Dressing was changed on POD # 1.  Incision - clean, dry and intact. No significant erythema or swelling. Neurovascular exam found to be within normal limits. Wound appears to be healing without any evidence of infection. Pt had a HVAC drain that was removed on POD# 1.      Physical Therapy started on the day following surgery and participated in bed mobility, transfers and ambulation.        Gait:  Gait  Speed/Cadence: Pace decreased (<100 feet/min)  Ambulation - Level of Assistance: Completely independent  Distance (ft): 250 Feet (ft)  Assistive Device: Brace/Splint;Gait belt;Walker, rolling  Rail Use: Both  Stairs - Level of Assistance: Completely independent  Number of Stairs Trained: 4                    Discharged to: Home.    Discharge instructions:  - Take pain medications as prescribed  - Resume pre hospital diet      - Discharge activity: activity as tolerated  - Ambulate with Walker;    - Weight bearing status WBAT  - Wound Care Keep wound clean and dry.  See discharge instruction sheet.  - Staples to be removed 10 days after surgery            -DISCHARGE MEDICATION LIST     Current Discharge Medication List      START taking these medications    Details   diazepam (VALIUM) 5 mg tablet Take 1 Tab by mouth every six (6) hours as needed.  Qty: 40 Tab, Refills: 0      oxyCODONE IR (ROXICODONE) 5 mg immediate release tablet Take 1-2 Tabs by mouth every three (3) hours as needed for Pain.  Qty: 80 Tab, Refills: 0      polyethylene glycol (MIRALAX) 17 gram packet Take 1 Packet by mouth daily as needed (constipation) for 10 days.  Qty: 10 Packet, Refills: 0         CONTINUE these medications which have NOT CHANGED    Details   methocarbamol (ROBAXIN) 750 mg tablet Take 750 mg by mouth three (3) times daily as needed.      gabapentin (NEURONTIN) 600 mg tablet Take 600 mg by mouth three (3) times daily.      docusate sodium (COLACE) 100 mg capsule Take 200 mg by mouth nightly.      valsartan (DIOVAN) 80 mg tablet Take 80 mg by mouth daily.      loratadine 10 mg cap Take 10 mg by mouth daily.      indapamide (LOZOL) 2.5 mg tablet Take 2.5 mg by mouth daily.      potassium chloride (KLOR-CON M20) 20 mEq tablet Take 20 mEq  by mouth daily.      omeprazole (PRILOSEC) 20 mg capsule Take 20 mg by mouth two (2) times a day.      mometasone (NASONEX) 50 mcg/actuation nasal spray 2 Sprays daily.         STOP taking these medications       HYDROmorphone (DILAUDID) 4 mg tablet Comments:   Reason for Stopping:            per medical continuation form      -Follow up in office in 2 weeks      Signed:  Tarri Abernethy. Aura Dials, MSN, ACNP, ONP-C  Orthopaedic Nurse Practitioner    08/25/2012  10:55 AM

## 2012-08-25 NOTE — Discharge Summary (Signed)
Pt seen and examined with Michelle today.  Agree with the assessment and plan.

## 2012-08-25 NOTE — Progress Notes (Signed)
Problem: Self Care Deficits Care Plan (Adult)  Goal: *Acute Goals and Plan of Care (Insert Text)  Occupational Therapy Goals:  Initiated 08/24/2012  1. Patient will perform grooming standing with modified independence within 7 days.   2. Patient will perform toileting with modified independence within 7 days.   3. Patient will perform upper body dressing and lower body dressing with modified independence within 7 days.  4. Patient will transfer from toilet with modified independence using the least restrictive device and appropriate durable medical equipment within 7 day.   OCCUPATIONAL THERAPY TREATMENT  Patient: [name] (61 y.o. female)  Date: 08/25/2012  Diagnosis: STENOSIS, ARTHRODESIS  STENOSIS, ARTHRODESIS <principal problem not specified>  Procedure(s) (LRB):  RIGHT L4 AND L5 DECOMPRESSION (Right) 2 Days Post-Op  Precautions: Spinal;Other (comment) (LS brace )      ASSESSMENT:  Pt presents with drowsiness but is receiving flexeril and valium.  She was able to state all LS precautions and adhered to them during mobility and ADLs.  She has all needed equipment at home and would be appropriate to return to home with family support.  Progression toward goals:  [X]        Improving appropriately and progressing toward goals  [ ]        Improving slowly and progressing toward goals  [ ]        Not making progress toward goals and plan of care will be adjusted       PLAN:  Patient continues to benefit from skilled intervention to address the above impairments.  Continue treatment per established plan of care.  Discharge Recommendations:  Home with family support  Further Equipment Recommendations for Discharge:  none       SUBJECTIVE:   Patient stated ???I want to take a nap.???      OBJECTIVE DATA SUMMARY:   Cognitive/Behavioral Status:  Neurologic State: Alert  Orientation Level: Oriented X4  Cognition: Appropriate decision making;Follows commands  Perception: Appears intact  Perseveration: No perseveration noted   Safety/Judgement: Awareness of environment  Functional Mobility and Transfers for ADLs:  Bed Mobility:  Rolling: Independent;Additional time  Supine to Sit: Independent;Additional time  Sit to Supine:  (SBA LE back into)  Scooting: Independent;Additional time  Transfers:  Sit to Stand: Independent;Additional time     Bed to Chair: Supervision (with rolling walker)                                                                             Bathroom Mobility: Supervision/set up (with rolling walker)                 Balance:  Sitting: Intact  Standing: Intact;With support  Standing - Static: Good  Standing - Dynamic : None  ADL Intervention:                      Upper Body Dressing Assistance  Orthotics(Brace): Supervision/set-up (standing to take of TLSO)    Lower Body Dressing Assistance  Socks:  (verbalized use of sock aid)    Toileting  Bladder Hygiene: Total assistance (dependent) (pt did not have toileting aid with her)  Clothing Management: Independent    Cognitive Retraining  Safety/Judgement: Awareness of  environment    Pain:  Pain Scale 1: Visual  Pain Intensity 1: 5  Pain Location 1: Back  Pain Orientation 1: Right  Pain Description 1: Aching  Pain Intervention(s) 1: Medication (see MAR)  Activity Tolerance:   VSS  Please refer to the flowsheet for vital signs taken during this treatment.  After treatment:   [X]  Patient left in no apparent distress sitting up in chair  [ ]  Patient left in no apparent distress in bed  [X]  Call bell left within reach  [X]  Nursing notified  [ ]  Caregiver present  [ ]  Bed alarm activated      COMMUNICATION/COLLABORATION:   The patient???s plan of care was discussed with: patient    Arlys John, OTR/L  Time Calculation: 12 mins

## 2012-08-25 NOTE — Progress Notes (Signed)
Problem: Mobility Impaired (Adult and Pediatric)  Goal: *Acute Goals and Plan of Care (Insert Text)  Physical Therapy Goals  Initiated 08/24/2012    1. Patient will move from supine to sit and sit to supine in bed with independence within 4 days.  2. Patient will perform sit to stand with independence within 4 days.  3. Patient will ambulate with independence for 250 feet with the least restrictive device within 4 days.  4. Patient will ascend/descend 4 stairs with handrail(s) with minimal assistance/contact guard assist within 4 days.  5. Patient will verbalize and demonstrate understanding of spinal precautions (No bending, lifting greater than 5 lbs, or twisting; log-roll technique; frequent repositioning as instructed) within 4 days.  PHYSICAL THERAPY TREATMENT  Patient: [name] (61 y.o. female)  Date: 08/25/2012  Diagnosis: STENOSIS, ARTHRODESIS  STENOSIS, ARTHRODESIS <principal problem not specified>  Procedure(s) (LRB):  RIGHT L4 AND L5 DECOMPRESSION (Right) 2 Days Post-Op  Precautions: Spinal;Other (comment) (LS brace )      ASSESSMENT:  Progressing well.  Feels better today.  Ambulates independently 250 feet in the hall and ascends and descends 4 steps using the hand rails.  Ambulates very slowly but is steady.  Ok for disch per PT.  No equipment needs.  Home health is to resume.   Progression toward goals:  [ ]       Improving appropriately and progressing toward goals  [X]       Improving slowly and progressing toward goals  [ ]       Not making progress toward goals and plan of care will be adjusted       PLAN:  Patient continues to benefit from skilled intervention to address the above impairments.  Continue treatment per established plan of care.  Discharge Recommendations:  Home Health  Further Equipment Recommendations for Discharge:  None-has a RW at home.        SUBJECTIVE:   Patient stated ???I am feeling better today.  My incision has hurt so much.???   The patient stated 3/3 back precautions.  Reviewed all 3 with patient.      OBJECTIVE DATA SUMMARY:   Critical Behavior:  Neurologic State: Alert;Appropriate for age;Eyes open spontaneously  Orientation Level: Oriented X4  Cognition: Appropriate decision making;Appropriate for age attention/concentration;Appropriate safety awareness;Follows commands  Safety/Judgement: Awareness of environment;Insight into deficits  Functional Mobility Training:  Bed Mobility:  Log Rolling: Independent;Additional time  Supine to Sit: Independent;Additional time     Scooting: Independent;Additional time        Brace donned with  minimal assistance/contact guard assist   Transfers:  Sit to Stand: Independent;Additional time  Stand to Sit: Independent;Additional time                             Balance:  Sitting: Intact  Standing: Intact;With support  Standing - Static: Good  Standing - Dynamic : Good  Ambulation/Gait Training:  Distance (ft): 250 Feet (ft)  Assistive Device: Brace/Splint;Gait belt;Walker, rolling  Ambulation - Level of Assistance: Completely independent                       Speed/Cadence: Pace decreased (<100 feet/min)                                Slow steady gait.  Used the bathroom and ambulated 250 feet and ascended and descended 4 steps  in 15 minutes.    Stairs:  Number of Stairs Trained: 4  Stairs - Level of Assistance: Completely independent  Rail Use: Both    Pain:  Pain Scale 1: Numeric (0 - 10)  Pain Intensity 1: 3  Pain Location 1: Back  Pain Orientation 1: Right  Pain Description 1: Aching  Pain Intervention(s) 1: Medication (see MAR)  Activity Tolerance:   good  Please refer to the flowsheet for vital signs taken during this treatment.  After treatment:   [X]   Patient left in no apparent distress sitting up in chair  [ ]   Patient left in no apparent distress in bed  [X]   Call bell left within reach  [X]   Nursing notified  [ ]   Caregiver present  [ ]   Bed alarm activated      COMMUNICATION/COLLABORATION:   The patient???s plan of care was discussed  with: Registered Nurse    Pleas Patricia, PT   Time Calculation: 30 mins

## 2012-08-25 NOTE — Progress Notes (Signed)
Patient taken to front lobby via wheelchair by volunteers.  Patient being discharged to home with her husband.  IV access removed and patient medicated for pain prior to discharged; all belongings accompanied patient upon discharge.

## 2012-08-25 NOTE — Progress Notes (Signed)
Bedside and Verbal shift change report given to Dana Tucker, RN (oncoming nurse) by Cathy Childress, RN (offgoing nurse).  Report given with SBAR, Kardex, Intake/Output, MAR and Recent Results.

## 2012-08-25 NOTE — Progress Notes (Signed)
Pt seen and examined with Matt today.  Agree with the above assessment and plan.

## 2012-08-25 NOTE — Progress Notes (Signed)
ORTHO POST OP SPINE PROGRESS NOTE    August 25, 2012  Admit Date: 08/23/2012  Admit Diagnosis: STENOSIS, ARTHRODESIS  STENOSIS, ARTHRODESIS  Procedure: Procedure(s):    Post Op day: 2 Days Post-Op    Subjective:     [name] is a patient who has complaints of back pain. no leg pain. did well with PT yesterday. states she is ready to go home.     Review of Systems: Pertinent items are noted in HPI.    Objective:     PT/OT:   Distance Ambulated:           Time Ambulated (min):        Ambulation Response:     Ambulation Response: Tolerated well  Assistive Device:              Assistive Device: Fall prevention device;Walker (comment)    Vital Signs:    Blood pressure 97/43, pulse 85, temperature 98.4 ??F (36.9 ??C), resp. rate 18, height 5' 2.75" (1.594 m), weight 102.059 kg (225 lb), SpO2 93.00%.    Temp (24hrs), Avg:98.6 ??F (37 ??C), Min:98 ??F (36.7 ??C), Max:99.8 ??F (37.7 ??C)         11/04 1900 - 11/06 0659  In: 785.4 [P.O.:780]  Out: 3360 [Urine:3300; Drains:60]    LAB:    Recent Labs      08/25/12   0239   HGB  9.9*       Wound/Drain Assessment:  Drain:      Dressing:     Physical Exam:  Neurological: no deficit  Incision clean, dry, and intact  5/5 strength in BLE    Assessment:      There is no problem list on file for this patient.      Plan:     Continue PT/OT/Rehab  Discontinue: IV and drain (remaining)  Consult: PT  and OT    Discharge To: home later today once clear by PT and pain controlled.    To go home with valium and flexeril, has oxy at home  F/u in 2 weeks    Signed By: Consuello Closs. [place], [place]

## 2012-08-25 NOTE — Progress Notes (Signed)
I have reviewed discharge instructions and prescriptions with the patient and her husband.  Patient aware to call for 2 weeks follow up appointment.  An opportunity for questions and clarification was given.

## 2012-08-25 NOTE — Progress Notes (Signed)
Pt is d/c today. She will d/c home with her husband. Pt was still on service with [facility] prior to admission. Pt would like to continue those services. SW has sent a referral to [facility] so that they can continue with PT for pt. There are no other identified d/c needs at this time.  Lynford Humphrey, MSW, [place] [id number]

## 2013-11-10 MED FILL — ONDANSETRON (PF) 4 MG/2 ML INJECTION: 4 mg/2 mL | INTRAMUSCULAR | Qty: 2

## 2013-11-11 MED FILL — PLASBUMIN 5 % INTRAVENOUS SOLUTION: 5 % | INTRAVENOUS | Qty: 250

## 2017-07-13 LAB — AMB EXT HGBA1C
Hemoglobin A1C, External: 5.3 NA
Hemoglobin A1c, External: 5.3

## 2017-09-02 ENCOUNTER — Encounter

## 2017-09-12 ENCOUNTER — Inpatient Hospital Stay: Admit: 2017-09-12 | Payer: MEDICARE | Attending: Podiatrist | Primary: Internal Medicine

## 2017-09-12 ENCOUNTER — Ambulatory Visit

## 2017-09-12 DIAGNOSIS — M7752 Other enthesopathy of left foot: Secondary | ICD-10-CM

## 2017-10-20 HISTORY — PX: POSTERIOR TIBIAL TENDON REPAIR: SHX6039

## 2017-11-24 ENCOUNTER — Inpatient Hospital Stay
Admit: 2017-11-24 | Discharge: 2017-11-24 | Disposition: A | Discharge status: Home / Self Care | Payer: MEDICARE | Attending: Emergency Medicine

## 2017-11-24 ENCOUNTER — Emergency Department: Admit: 2017-11-24 | Payer: MEDICARE | Primary: Internal Medicine

## 2017-11-24 DIAGNOSIS — R002 Palpitations: Secondary | ICD-10-CM

## 2017-11-24 LAB — URINALYSIS W/ REFLEX CULTURE
Bacteria: NEGATIVE /hpf
Bilirubin: NEGATIVE
Glucose: NEGATIVE mg/dL
Ketone: NEGATIVE mg/dL
Leukocyte Esterase: NEGATIVE
Nitrites: NEGATIVE
Protein: NEGATIVE mg/dL
Specific gravity: 1.007 (ref 1.003–1.030)
Urobilinogen: 0.2 EU/dL (ref 0.2–1.0)
pH (UA): 6.5 (ref 5.0–8.0)

## 2017-11-24 LAB — CBC WITH AUTOMATED DIFF
ABS. BASOPHILS: 0 10*3/uL (ref 0.0–0.1)
ABS. EOSINOPHILS: 0.2 10*3/uL (ref 0.0–0.4)
ABS. IMM. GRANS.: 0 10*3/uL (ref 0.00–0.04)
ABS. LYMPHOCYTES: 1.5 10*3/uL (ref 0.8–3.5)
ABS. MONOCYTES: 0.8 10*3/uL (ref 0.0–1.0)
ABS. NEUTROPHILS: 7.9 10*3/uL (ref 1.8–8.0)
ABSOLUTE NRBC: 0 10*3/uL (ref 0.00–0.01)
BASOPHILS: 0 % (ref 0–1)
EOSINOPHILS: 2 % (ref 0–7)
HCT: 41.6 % (ref 35.0–47.0)
HGB: 14.1 g/dL (ref 11.5–16.0)
IMMATURE GRANULOCYTES: 0 % (ref 0.0–0.5)
LYMPHOCYTES: 14 % (ref 12–49)
MCH: 31.4 PG (ref 26.0–34.0)
MCHC: 33.9 g/dL (ref 30.0–36.5)
MCV: 92.7 FL (ref 80.0–99.0)
MONOCYTES: 8 % (ref 5–13)
MPV: 12.3 FL (ref 8.9–12.9)
NEUTROPHILS: 76 % — ABNORMAL HIGH (ref 32–75)
NRBC: 0 PER 100 WBC
PLATELET: 320 10*3/uL (ref 150–400)
RBC: 4.49 M/uL (ref 3.80–5.20)
RDW: 12.7 % (ref 11.5–14.5)
WBC: 10.3 10*3/uL (ref 3.6–11.0)

## 2017-11-24 LAB — METABOLIC PANEL, COMPREHENSIVE
A-G Ratio: 1 — ABNORMAL LOW (ref 1.1–2.2)
ALT (SGPT): 27 U/L (ref 12–78)
AST (SGOT): 14 U/L — ABNORMAL LOW (ref 15–37)
Albumin: 3.7 g/dL (ref 3.5–5.0)
Alk. phosphatase: 83 U/L (ref 45–117)
Anion gap: 9 mmol/L (ref 5–15)
BUN/Creatinine ratio: 16 (ref 12–20)
BUN: 12 MG/DL (ref 6–20)
Bilirubin, total: 0.4 MG/DL (ref 0.2–1.0)
CO2: 28 mmol/L (ref 21–32)
Calcium: 8.3 MG/DL — ABNORMAL LOW (ref 8.5–10.1)
Chloride: 103 mmol/L (ref 97–108)
Creatinine: 0.75 MG/DL (ref 0.55–1.02)
GFR est AA: >60 mL/min/{1.73_m2} (ref >60)
GFR est non-AA: >60 mL/min/{1.73_m2} (ref >60)
Globulin: 3.8 g/dL (ref 2.0–4.0)
Glucose: 136 mg/dL — ABNORMAL HIGH (ref 65–100)
Potassium: 2.9 mmol/L — ABNORMAL LOW (ref 3.5–5.1)
Protein, total: 7.5 g/dL (ref 6.4–8.2)
Sodium: 140 mmol/L (ref 136–145)

## 2017-11-24 LAB — CK W/ REFLX CKMB: CK: 74 U/L (ref 26–192)

## 2017-11-24 LAB — MAGNESIUM: Magnesium: 2 mg/dL (ref 1.6–2.4)

## 2017-11-24 LAB — SAMPLES BEING HELD

## 2017-11-24 LAB — TROPONIN I: Troponin-I, Qt.: <0.05 ng/mL (ref <0.05)

## 2017-11-24 LAB — LIPASE: Lipase: 63 U/L — ABNORMAL LOW (ref 73–393)

## 2017-11-24 MED ORDER — ALUM-MAG HYDROXIDE-SIMETH 200 MG-200 MG-20 MG/5 ML ORAL SUSP
200-200-20 mg/5 mL | ORAL | Status: AC
Start: 2017-11-24 — End: 2017-11-24
  Administered 2017-11-24: 16:00:00 via ORAL

## 2017-11-24 MED ORDER — POTASSIUM CHLORIDE 20 MEQ ORAL PACKET FOR SOLUTION
20 mEq | ORAL | Status: AC
Start: 2017-11-24 — End: 2017-11-24
  Administered 2017-11-24: 19:00:00 via ORAL

## 2017-11-24 MED ORDER — ASPIRIN 81 MG CHEWABLE TAB
81 mg | ORAL | Status: AC
Start: 2017-11-24 — End: 2017-11-24
  Administered 2017-11-24: 16:00:00 via ORAL

## 2017-11-24 MED FILL — CHILDREN'S ASPIRIN 81 MG CHEWABLE TABLET: 81 mg | ORAL | Qty: 3

## 2017-11-24 MED FILL — KLOR-CON 20 MEQ ORAL PACKET: 20 mEq | ORAL | Qty: 2

## 2017-11-24 MED FILL — MAG-AL PLUS 200 MG-200 MG-20 MG/5 ML ORAL SUSPENSION: 200-200-20 mg/5 mL | ORAL | Qty: 30

## 2017-11-24 NOTE — ED Triage Notes (Signed)
Dr. Veto Kempsudd at bedside o/a of patient to room 21, Ethelene BrownsAnthony primary RN at bedside

## 2017-11-24 NOTE — ED Provider Notes (Signed)
EMERGENCY DEPARTMENT HISTORY AND PHYSICAL EXAM      Date: 11/24/2017  Patient Name: [name]    History of Presenting Illness     Chief Complaint   Patient presents with   ??? Shortness of Breath     arrived POV with c/o increased shortness of breath over last couple days, describes chest pain with palpations, Dr. Koren Bound Cards asked patient to come to ED for eval       History Provided By: Patient and Patient's Husband    HPI: [name], 68 y.o. female with PMHx significant for aortic stenosis, HTN, connective tissue autoimmune disease, heart murmur, fibromyalgia, OA, GERD, asthma, goiter, presents ambulatory to the ED with cc of moderate, intermittent palpitations that started 1 week ago. Pt also reports aching CP in the center of her chest x1 week. Pt states her palpitations have been pounding in nature. Pt states she feels her palpitations currently, but with improved severity. Pt states her Sx are worsened with walking. She states she does not believe her Sx are worse after eating. Pt reports she took 2 tylenol today and states she takes enteric ASA daily. Pt notes she thinks she was having indigestion last night after eating a large meal. Pt states she has taken no meds to try and relieve her Sx. She denies any other modifying factors to her Sx. Pt notes she has a Hx of hysterectomy, but denies any other abd surgeries or any surgeries on her chest. She denies any Hx of DM. Pt specifically denies SOB, nausea, vomiting, diaphoresis, numbness, weakness, tingling, calf pain, LE swelling, extremity pain, and lightheadedness.     There are no other complaints, changes, or physical findings at this time.    PCP: [name], MD   Cardiology: Dr. Kathrynn Running. Mueller, III    Current Outpatient Medications   Medication Sig Dispense Refill   ??? rosuvastatin (CRESTOR) 5 mg tablet Take 5 mg by mouth daily.     ??? losartan (COZAAR) 100 mg tablet Take 100 mg by mouth daily.      ??? traMADol (ULTRAM) 50 mg tablet Take 50 mg by mouth two (2) times a day.     ??? desvenlafaxine succinate (PRISTIQ) 50 mg ER tablet Take 50 mg by mouth daily.     ??? amLODIPine (NORVASC) 10 mg tablet Take 10 mg by mouth daily.     ??? DULoxetine (CYMBALTA) 60 mg capsule Take 60 mg by mouth daily.     ??? aspirin delayed-release 81 mg tablet Take 81 mg by mouth daily.     ??? indapamide (LOZOL) 2.5 mg tablet Take 1.25 mg by mouth daily.     ??? omeprazole (PRILOSEC) 20 mg capsule Take 20 mg by mouth two (2) times a day.         Past History     Past Medical History:  Past Medical History:   Diagnosis Date   ??? Arthritis     osteo arthritis   ??? Asthma    ??? Autoimmune disease ([place])     connective tissue disease   ??? GERD (gastroesophageal reflux disease)    ??? Hypertension    ??? Nausea & vomiting    ??? Other and unspecified symptoms and signs involving general sensations and perceptions     fibromyalgia/osteo arthritis   ??? Other ill-defined conditions(799.89)     heart mumur   ??? Thyroid disease     goiter/no longer on medication       Past Surgical  History:  Past Surgical History:   Procedure Laterality Date   ??? HX HYSTERECTOMY     ??? HX ORTHOPAEDIC      right rotator cuff repair   ??? HX ORTHOPAEDIC      l4-l5 spinal fusion   ??? HX ORTHOPAEDIC  08/23/12    RIGHT L4 AND L5 DECOMPRESSION   ??? HX OTHER SURGICAL      cystectomy right shoulder blade   ??? HX OTHER SURGICAL      wisdom teeth extraction   ??? HX TUBAL LIGATION         Family History:  Family History   Problem Relation Age of Onset   ??? Lung Disease Mother    ??? Hypertension Mother    ??? Heart Disease Father        Social History:  Social History     Tobacco Use   ??? Smoking status: Former Smoker     Packs/day: 0.50     Years: 5.00     Pack years: 2.50     Last attempt to quit: 08/19/1975     Years since quitting: 42.2   ??? Smokeless tobacco: Never Used   Substance Use Topics   ??? Alcohol use: Yes     Comment: occassionally   ??? Drug use: No       Allergies:  Allergies    Allergen Reactions   ??? Adhesive Rash   ??? Dobutamine Other (comments)     Pt had a paradoxical reaction during a stress test and dropped her heart rate and b/p.  See stress test notes in cc.   ??? Sulfa (Sulfonamide Antibiotics) Rash         Review of Systems   Review of Systems   Constitutional: Negative for chills and fever.   HENT: Negative for congestion and sore throat.    Eyes: Negative for visual disturbance.   Respiratory: Negative for cough and shortness of breath.    Cardiovascular: Positive for chest pain (aching) and palpitations (pounding). Negative for leg swelling.   Gastrointestinal: Negative for abdominal pain, blood in stool, diarrhea and nausea.   Endocrine: Negative for polyuria.   Genitourinary: Negative for dysuria, flank pain, vaginal bleeding and vaginal discharge.   Musculoskeletal: Negative for myalgias.   Skin: Negative for rash.   Allergic/Immunologic: Negative for immunocompromised state.   Neurological: Negative for weakness and headaches.   Psychiatric/Behavioral: Negative for confusion.       Physical Exam   Physical Exam   Constitutional: She is oriented to person, place, and time. She appears well-developed and well-nourished.   HENT:   Head: Normocephalic and atraumatic.   Moist mucous membranes   Eyes: Conjunctivae are normal. Pupils are equal, round, and reactive to light. Right eye exhibits no discharge. Left eye exhibits no discharge.   Neck: Normal range of motion. Neck supple. No tracheal deviation present.   Cardiovascular: Normal rate, regular rhythm and normal heart sounds.   3/6 systolic murmur heard equilaterally at the left and right sternal boarder, and at the apex and into the carotids. Murmur is consistent with AS.    Pulmonary/Chest: Effort normal and breath sounds normal. No respiratory distress. She has no wheezes. She has no rales.   Abdominal: Soft. Bowel sounds are normal. There is no tenderness. There is no rebound and no guarding.    Musculoskeletal: Normal range of motion. She exhibits no edema, tenderness or deformity.   Neurological: She is alert and oriented to person, place, and time.  Skin: Skin is warm and dry. No rash noted. No erythema.   Psychiatric: Her behavior is normal.   Nursing note and vitals reviewed.    Diagnostic Study Results     Labs -     Recent Results (from the past 12 hour(s))   SAMPLES BEING HELD    Collection Time: 11/24/17 11:00 AM   Result Value Ref Range    SAMPLES BEING HELD 1PST 1LAV     COMMENT        Add-on orders for these samples will be processed based on acceptable specimen integrity and analyte stability, which may vary by analyte.   CBC WITH AUTOMATED DIFF    Collection Time: 11/24/17 11:15 AM   Result Value Ref Range    WBC 10.3 3.6 - 11.0 K/uL    RBC 4.49 3.80 - 5.20 M/uL    HGB 14.1 11.5 - 16.0 g/dL    HCT 41.6 35.0 - 47.0 %    MCV 92.7 80.0 - 99.0 FL    MCH 31.4 26.0 - 34.0 PG    MCHC 33.9 30.0 - 36.5 g/dL    RDW 12.7 11.5 - 14.5 %    PLATELET 320 150 - 400 K/uL    MPV 12.3 8.9 - 12.9 FL    NRBC 0.0 0 PER 100 WBC    ABSOLUTE NRBC 0.00 0.00 - 0.01 K/uL    NEUTROPHILS 76 (H) 32 - 75 %    LYMPHOCYTES 14 12 - 49 %    MONOCYTES 8 5 - 13 %    EOSINOPHILS 2 0 - 7 %    BASOPHILS 0 0 - 1 %    IMMATURE GRANULOCYTES 0 0.0 - 0.5 %    ABS. NEUTROPHILS 7.9 1.8 - 8.0 K/UL    ABS. LYMPHOCYTES 1.5 0.8 - 3.5 K/UL    ABS. MONOCYTES 0.8 0.0 - 1.0 K/UL    ABS. EOSINOPHILS 0.2 0.0 - 0.4 K/UL    ABS. BASOPHILS 0.0 0.0 - 0.1 K/UL    ABS. IMM. GRANS. 0.0 0.00 - 0.04 K/UL    DF AUTOMATED     METABOLIC PANEL, COMPREHENSIVE    Collection Time: 11/24/17 11:15 AM   Result Value Ref Range    Sodium 140 136 - 145 mmol/L    Potassium 2.9 (L) 3.5 - 5.1 mmol/L    Chloride 103 97 - 108 mmol/L    CO2 28 21 - 32 mmol/L    Anion gap 9 5 - 15 mmol/L    Glucose 136 (H) 65 - 100 mg/dL    BUN 12 6 - 20 MG/DL    Creatinine 0.75 0.55 - 1.02 MG/DL    BUN/Creatinine ratio 16 12 - 20      GFR est AA >60 >60 ml/min/1.22m     GFR est non-AA >60 >60 ml/min/1.759m   Calcium 8.3 (L) 8.5 - 10.1 MG/DL    Bilirubin, total 0.4 0.2 - 1.0 MG/DL    ALT (SGPT) 27 12 - 78 U/L    AST (SGOT) 14 (L) 15 - 37 U/L    Alk. phosphatase 83 45 - 117 U/L    Protein, total 7.5 6.4 - 8.2 g/dL    Albumin 3.7 3.5 - 5.0 g/dL    Globulin 3.8 2.0 - 4.0 g/dL    A-G Ratio 1.0 (L) 1.1 - 2.2     LIPASE    Collection Time: 11/24/17 11:15 AM   Result Value Ref Range    Lipase 63 (L) 73 -  393 U/L   MAGNESIUM    Collection Time: 11/24/17 11:15 AM   Result Value Ref Range    Magnesium 2.0 1.6 - 2.4 mg/dL   CK W/ REFLX CKMB    Collection Time: 11/24/17 11:15 AM   Result Value Ref Range    CK 74 26 - 192 U/L   TROPONIN I    Collection Time: 11/24/17 11:15 AM   Result Value Ref Range    Troponin-I, Qt. <0.05 <0.05 ng/mL   URINALYSIS W/ REFLEX CULTURE    Collection Time: 11/24/17 12:40 PM   Result Value Ref Range    Color YELLOW/STRAW      Appearance CLEAR CLEAR      Specific gravity 1.007 1.003 - 1.030      pH (UA) 6.5 5.0 - 8.0      Protein NEGATIVE  NEG mg/dL    Glucose NEGATIVE  NEG mg/dL    Ketone NEGATIVE  NEG mg/dL    Bilirubin NEGATIVE  NEG      Blood TRACE (A) NEG      Urobilinogen 0.2 0.2 - 1.0 EU/dL    Nitrites NEGATIVE  NEG      Leukocyte Esterase NEGATIVE  NEG      WBC 0-4 0 - 4 /hpf    RBC 0-5 0 - 5 /hpf    Epithelial cells FEW FEW /lpf    Bacteria NEGATIVE  NEG /hpf    UA:UC IF INDICATED CULTURE NOT INDICATED BY UA RESULT CNI      Hyaline cast 0-2 0 - 5 /lpf       Radiologic Studies -   XR CHEST PORT   Final Result   Impression:   No acute cardiopulmonary process.        CXR Results  (Last 48 hours)               11/24/17 1159  XR CHEST PORT Final result    Impression:  Impression:   No acute cardiopulmonary process.       Narrative:  Chest portable AP       History: Chest pain. Short of breath. Palpitations.       Comparison: None       Findings:  The lungs are well expanded.  A thin line of discoid atelectasis or    scarring is seen in the right lateral costophrenic angle. No focal   consolidation, pleural effusion, or pneumothorax.  The cardiomediastinal   silhouette is unremarkable.  The visualized osseous structures are unremarkable.                   Medical Decision Making   I am the first provider for this patient.    I reviewed the vital signs, available nursing notes, past medical history, past surgical history, family history and social history.    Vital Signs-Reviewed the patient's vital signs.  Patient Vitals for the past 12 hrs:   Pulse Resp BP SpO2   11/24/17 1426 80 16 140/79 98 %   11/24/17 1330 79 15 143/74 92 %   11/24/17 1200 75 15 148/76 94 %   11/24/17 1100 83 18 97/64 97 %       Pulse Oximetry Analysis - 97% on RA    Cardiac Monitor:   Rate: 83 bpm  Rhythm: Normal Sinus Rhythm      EKG interpretation: (Preliminary) 1048  Rhythm: normal sinus rhythm; and regular . Rate (approx.): 81; Axis: normal; PR interval: normal; QRS interval: normal ;  ST/T wave: nonspecific lateral ST changes; Other findings: normal  Written by Herminio Commons, ED Scribe, as dictated by Alan Ripper, DO.    Records Reviewed: Nursing Notes, Old Medical Records, Previous Radiology Studies and Previous Laboratory Studies    Provider Notes (Medical Decision Making):   Patient presents with CP.  DDx:  ACS, Aortic dissection, PNA, PE, PTX, pericarditis, myocarditis, GERD, costochondritis, anxiety.  Most likely non cardiac given the HPI and Physical exam. Will obtain labs, CXR, EKG.    - I have re-examined the patient and the patient denies chest pain on re-examination.  The patient has had onset of chest pain greater than 8 hours and one negative set of cardiac enzymes or 2 negative sets of cardiac enzymes in the ER during this visit.  The diagnosis, follow up, return instructions, test results, x-rays and medications have been discussed and reviewed with the patient.  The patient has been given the  opportunity to ask questions. The patient  expresses understanding of the diagnosis, follow-up and return instructions.  The patient agrees to follow up with primary care or cardiology as directed and to return immediately if the chest pain worsens.  The patient expresses understanding that although cardiac testing at this time is negative, a cardiac problem could still be present and that a follow-up appointment for further evaluation and risk factor modification is necessary to complete the evaluation of this complaint.     ED Course:   Initial assessment performed. The patients presenting problems have been discussed, and they are in agreement with the care plan formulated and outlined with them.  I have encouraged them to ask questions as they arise throughout their visit.    Consult Note:  2:09 PM  Alan Ripper, DO spoke with Dr. Kathrynn Running. Mueller, III,  Specialty: Cardiology  Discussed pt's hx, disposition, and available diagnostic and imaging results. Reviewed care plans. Consultant agrees with plans as outlined. Dr. Koren Bound agrees with plan for discharge and follow-up in his office.     Progress Note:       Medications   aspirin chewable tablet 243 mg (243 mg Oral Given 11/24/17 1123)   mylanta/viscous lidocaine (GI COCKTAIL) (40 mL Oral Given 11/24/17 1123)   potassium chloride (KLOR-CON) packet for solution 40 mEq (40 mEq Oral Given 11/24/17 1347)       Critical Care Time:   0 minutes    Disposition:  Discharge Note:  1:52 PM  The pt is ready for discharge. The pt's signs, symptoms, diagnosis, and discharge instructions have been discussed and pt has conveyed their understanding. The pt is to follow up as recommended or return to ER should their symptoms worsen. Plan has been discussed and pt is in agreement.    PLAN:  1.   Discharge Medication List as of 11/24/2017  1:52 PM        2.   Follow-up Information     Follow up With Specialties Details Why Contact Info     [name], MD Internal Medicine Schedule an appointment as soon as possible for a visit  [place] [address]  [phone]      [facility] EMERGENCY DEPT Emergency Medicine  If symptoms worsen [address]  [place] [address]  [place], [place], DO Cardiology Schedule an appointment as soon as possible for a visit  [address]  [address]  [place] [address]  [phone]  Return to ED if worse     Diagnosis     Clinical Impression:   1. Palpitations        Attestations:    This note is prepared by Kevan Ny A. Collene Mares, acting as Education administrator for [employer], DO.    Alan Ripper, DO: The scribe's documentation has been prepared under my direction and personally reviewed by me in its entirety. I confirm that the note above accurately reflects all work, treatment, procedures, and medical decision making performed by me.      This note will not be viewable in [place].

## 2017-11-24 NOTE — ED Notes (Signed)
Rudd reviewed discharge instructions with the patient.  The patient verbalized understanding.

## 2017-11-24 NOTE — ED Triage Notes (Signed)
Arrives to ED c c/o intermittent Dizziness/Palpitation/ "Heart Ache" x5-6 days.

## 2017-11-25 LAB — EKG, 12 LEAD, INITIAL
Atrial Rate: 81 {beats}/min
Calculated P Axis: 40 degrees
Calculated T Axis: 113 degrees
Diagnosis: NORMAL
P-R Interval: 154 ms
Q-T Interval: 400 ms
QRS Duration: 92 ms
QTC Calculation (Bezet): 464 ms
Ventricular Rate: 81 {beats}/min

## 2017-11-25 LAB — EKG 12-LEAD
Atrial Rate: 81 {beats}/min
Diagnosis: NORMAL
P Axis: 40 degrees
P-R Interval: 154 ms
Q-T Interval: 400 ms
QRS Duration: 92 ms
QTc Calculation (Bazett): 464 ms
T Axis: 113 degrees
Ventricular Rate: 81 {beats}/min

## 2017-12-23 ENCOUNTER — Ambulatory Visit
Admit: 2017-12-23 | Discharge: 2017-12-23 | Payer: MEDICARE | Attending: Cardiovascular Disease | Primary: Internal Medicine

## 2017-12-23 DIAGNOSIS — I350 Nonrheumatic aortic (valve) stenosis: Secondary | ICD-10-CM

## 2017-12-23 NOTE — Progress Notes (Signed)
Establish care due to Aortic valve stenosis she C/O SOB with activity.

## 2017-12-23 NOTE — Progress Notes (Signed)
[address], [place] [address]  [phone]     Subjective:      [name] is a 67 y.o. female HTN, HLD, moderate AS, asthma, AI connective tissue d/o is here to establish care and further evaluation of DOE.  Previously followed by Dr Koren Bound with [facility].  She reports doe, occasional exertional chest discomfort and occasional mild dizziness.  These symptoms have been going on for several months.  Noted ED presentation Feb 5th for sob/palpitation/cp.  LE edema L>R, injury left ankle.     Denies  orthopnea, PND,  syncope, or presyncope.       Patient Active Problem List    Diagnosis Date Noted   ??? Obesity, morbid ([place]) 12/23/2017      Shelby Males, MD  Past Medical History:   Diagnosis Date   ??? Arthritis     osteo arthritis   ??? Asthma    ??? Autoimmune disease ([place])     connective tissue disease   ??? GERD (gastroesophageal reflux disease)    ??? Hypertension    ??? Nausea & vomiting    ??? Other and unspecified symptoms and signs involving general sensations and perceptions     fibromyalgia/osteo arthritis   ??? Other ill-defined conditions(799.89)     heart mumur   ??? Thyroid disease     goiter/no longer on medication      Past Surgical History:   Procedure Laterality Date   ??? HX HYSTERECTOMY     ??? HX ORTHOPAEDIC      right rotator cuff repair   ??? HX ORTHOPAEDIC      l4-l5 spinal fusion   ??? HX ORTHOPAEDIC  08/23/12    RIGHT L4 AND L5 DECOMPRESSION   ??? HX OTHER SURGICAL      cystectomy right shoulder blade   ??? HX OTHER SURGICAL      wisdom teeth extraction   ??? HX TUBAL LIGATION       Allergies   Allergen Reactions   ??? Adhesive Rash   ??? Dobutamine Other (comments)     Pt had a paradoxical reaction during a stress test and dropped her heart rate and b/p.  See stress test notes in cc.   ??? Sulfa (Sulfonamide Antibiotics) Rash      Family History   Problem Relation Age of Onset   ??? Lung Disease Mother    ??? Hypertension Mother    ??? Heart Disease Father       Social History      Socioeconomic History   ??? Marital status: MARRIED     Spouse name: Not on file   ??? Number of children: Not on file   ??? Years of education: Not on file   ??? Highest education level: Not on file   Social Needs   ??? Financial resource strain: Not on file   ??? Food insecurity - worry: Not on file   ??? Food insecurity - inability: Not on file   ??? Transportation needs - medical: Not on file   ??? Transportation needs - non-medical: Not on file   Occupational History   ??? Not on file   Tobacco Use   ??? Smoking status: Former Smoker     Packs/day: 0.50     Years: 5.00     Pack years: 2.50     Last attempt to quit: 08/19/1975     Years since quitting: 42.3   ??? Smokeless tobacco: Never Used   Substance and Sexual Activity   ???  Alcohol use: Yes     Comment: occassionally   ??? Drug use: No   ??? Sexual activity: Not on file   Other Topics Concern   ??? Not on file   Social History Narrative   ??? Not on file      Current Outpatient Medications   Medication Sig   ??? telmisartan (MICARDIS) 80 mg tablet TAKE 1 TABLET BY MOUTH EVERY DAY   ??? indapamide (LOZOL) 1.25 mg tablet TAKE 1 TABLET BY MOUTH EVERY DAY IN THE MORNING   ??? OTHER Tumeric with ginger   ??? APPLE CIDER VINEGAR PO Take  by mouth.   ??? cholecalciferol (VITAMIN D3) 1,000 unit tablet Take  by mouth daily.   ??? rosuvastatin (CRESTOR) 5 mg tablet Take 5 mg by mouth every fourty-eight (48) hours.   ??? traMADol (ULTRAM) 50 mg tablet Take 50 mg by mouth two (2) times daily as needed.   ??? desvenlafaxine succinate (PRISTIQ) 50 mg ER tablet Take 50 mg by mouth daily.   ??? amLODIPine (NORVASC) 10 mg tablet Take 10 mg by mouth daily.   ??? DULoxetine (CYMBALTA) 60 mg capsule Take 60 mg by mouth daily.   ??? aspirin delayed-release 81 mg tablet Take 81 mg by mouth daily.   ??? omeprazole (PRILOSEC) 20 mg capsule Take 20 mg by mouth two (2) times a day.     No current facility-administered medications for this visit.          Review of Symptoms:  11 systems reviewed, negative other than as stated in the HPI     Physical ExamPhysical Exam: ??  Vitals:    12/23/17 1438 12/23/17 1439 12/23/17 1538   BP: 140/80 150/90 150/88   Pulse: 86     Resp: 16     SpO2: 99%     Weight: 230 lb 12.8 oz (104.7 kg)     Height: 5' 3"  (1.6 m)       Body mass index is 40.88 kg/m??.  General PE   Gen:  NAD  Mental Status - Alert. General Appearance - Not in acute distress.   Chest and Lung Exam   Inspection: Accessory muscles - No use of accessory muscles in breathing.   Auscultation:   Breath sounds: - Normal.   Cardiovascular   Inspection: Jugular vein - Bilateral - Inspection Normal.   Palpation/Percussion:   Apical Impulse: - Normal.   Auscultation: Rhythm - Regular. Heart Sounds - S1 WNL and S2 WNL. No S3 or S4.   Murmurs & Other Heart Sounds: Auscultation of the heart reveals - 3/6 SEM  Peripheral Vascular   Upper Extremity: Inspection - Bilateral - No Cyanotic nailbeds or Digital clubbing.   Lower Extremity:   Palpation: Edema - Bilateral - dependent LE swelling  Abdomen:?? ??Soft, non-tender, bowel sounds are active.  Neuro: A&O times 3, CN and motor grossly WNL    Labs:   No results found for: CHOL, CHOLX, CHLST, CHOLV, 884269, HDL, LDL, LDLC, DLDLP, TGLX, TRIGL, TRIGP, CHHD, CHHDX  No results found for: CPK, CPKX, CPX  Lab Results   Component Value Date/Time    Sodium 140 11/24/2017 11:15 AM    Potassium 2.9 (L) 11/24/2017 11:15 AM    Chloride 103 11/24/2017 11:15 AM    CO2 28 11/24/2017 11:15 AM    Anion gap 9 11/24/2017 11:15 AM    Glucose 136 (H) 11/24/2017 11:15 AM    BUN 12 11/24/2017 11:15 AM    Creatinine 0.75 11/24/2017 11:15  AM    BUN/Creatinine ratio 16 11/24/2017 11:15 AM    GFR est AA >60 11/24/2017 11:15 AM    GFR est non-AA >60 11/24/2017 11:15 AM    Calcium 8.3 (L) 11/24/2017 11:15 AM    Bilirubin, total 0.4 11/24/2017 11:15 AM    AST (SGOT) 14 (L) 11/24/2017 11:15 AM    Alk. phosphatase 83 11/24/2017 11:15 AM    Protein, total 7.5 11/24/2017 11:15 AM    Albumin 3.7 11/24/2017 11:15 AM    Globulin 3.8 11/24/2017 11:15 AM     A-G Ratio 1.0 (L) 11/24/2017 11:15 AM    ALT (SGPT) 27 11/24/2017 11:15 AM       EKG:  SR     Assessment:     Assessment:      1. Aortic valve stenosis, etiology of cardiac valve disease unspecified    2. Essential hypertension    3. Mixed hyperlipidemia    4. DOE (dyspnea on exertion)        Orders Placed This Encounter   ??? AMB POC EKG ROUTINE W/ 12 LEADS, INTER & REP     Order Specific Question:   Reason for Exam:     Answer:   routine   ??? telmisartan (MICARDIS) 80 mg tablet     Sig: TAKE 1 TABLET BY MOUTH EVERY DAY     Refill:  2   ??? DISCONTD: KLOR-CON M20 20 mEq tablet     Sig: TAKE 1 TABLET BY MOUTH ONCE A DAY     Refill:  0   ??? DISCONTD: amLODIPine (NORVASC) 5 mg tablet     Sig: Take 5 mg by mouth.   ??? indapamide (LOZOL) 1.25 mg tablet     Sig: TAKE 1 TABLET BY MOUTH EVERY DAY IN THE MORNING   ??? OTHER     Sig: Tumeric with ginger   ??? APPLE CIDER VINEGAR PO     Sig: Take  by mouth.   ??? cholecalciferol (VITAMIN D3) 1,000 unit tablet     Sig: Take  by mouth daily.        Plan:     This is a 67 y.o. female HTN, HLD, moderate AS, asthma, AI connective tissue d/o is here to establish care and further evaluation of DOE.  Previously followed by Dr Koren Bound with [facility].  She reports doe, occasional exertional chest discomfort and occasional mild dizziness.  These symptoms have been going on for several months.  Noted ED presentation Feb 5th for sob/palpitation/cp.  LE edema L>R, injury left ankle.    Cardiac risk factors: HTN, HLD, postmenopausal female, age, elevated BMI, sedentary lifestyle, family hx CAD, previous smoker    Moderate AS per echo 12/18 with EF 60%, cLVH, mean gradient 24 mmHg, MAC, trivial MR, RVSP  Will repeat echo in 3 months, prior to follow-up.  In the meantime, she will work hard on diet and exercise to see if it helps her shortness of breath.    Exertional chest discomfort  Negative NST in 2015.  On ASA, on statin but taking every other day only d/t concern re side  effects.  Recommend daily statin.  Will obtain Lexiscan    HTN  Elevated. Continue Amlodipine, Telmisartan  Consider adding third agent once testing complete and in since she c/o mild dizziness  She will call us if home BP >140/85    HLD  LDL at 149. On statin--recommend to take daily  Lipids and labs followed by PCP.  For the palpitation, she will call us if symptom persists: increase in intensity, frequency or duration    AI, connective tissue disorder  Followed by Dr Lynelle [place]    She had previously seen Dr Patsey Berthold.  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 On ASA, statin    Counseled on diet and exercise- eventual goal of 30-60 minutes 5-7 times a week as per AHA guidelines.  Goal of 10% (23 lbs) weight loss for 6 months.     Continue current care and f/u in 3 months after repeat echo unless Lexiscan Myoview is abnormal.    Scarlett Presto, MD

## 2018-01-12 ENCOUNTER — Ambulatory Visit: Admit: 2018-01-12 | Discharge: 2018-01-12 | Payer: MEDICARE | Primary: Internal Medicine

## 2018-01-12 ENCOUNTER — Ambulatory Visit

## 2018-01-12 DIAGNOSIS — I350 Nonrheumatic aortic (valve) stenosis: Secondary | ICD-10-CM

## 2018-01-12 MED ORDER — REGADENOSON 0.4 MG/5 ML IV SYRINGE
0.4 mg/5 mL | Freq: Once | INTRAVENOUS | Status: AC
Start: 2018-01-12 — End: 2018-01-12
  Administered 2018-01-12: 13:00:00 via INTRAVENOUS

## 2018-01-12 MED ORDER — TECHNETIUM TC-99M TETROFOSMIN 1.38 MG IV SOLUTION KIT
1.38 mg | Freq: Once | INTRAVENOUS | Status: AC
Start: 2018-01-12 — End: 2018-01-12
  Administered 2018-01-12: 13:00:00 via INTRAVENOUS

## 2018-01-12 NOTE — Progress Notes (Signed)
Please advise patient stress test without evidence of flow-limiting blockages in the arteries of the heart- f/u after echo in 3 months if doing well.

## 2018-01-13 ENCOUNTER — Encounter: Admit: 2018-01-13 | Discharge: 2018-01-13 | Payer: MEDICARE | Primary: Internal Medicine

## 2018-01-13 MED ORDER — TECHNETIUM TC-99M TETROFOSMIN 1.38 MG IV SOLUTION KIT
1.38 mg | Freq: Once | INTRAVENOUS | Status: AC
Start: 2018-01-13 — End: 2018-01-13
  Administered 2018-01-13: 18:00:00 via INTRAVENOUS

## 2018-01-14 LAB — NUCLEAR CARDIAC STRESS TEST
Baseline Diastolic BP: 88 mmHg
Baseline HR: 73 {beats}/min
Baseline Systolic BP: 150 mmHg
Stress Diastolic BP: 88 mmHg
Stress Peak HR: 82 {beats}/min
Stress Percent HR Achieved: 54 %
Stress Rate Pressure Product: 12300 bpm*mmHg
Stress ST Depression: 0 mm
Stress ST Elevation: 0 mm
Stress Systolic BP: 150 mmHg
Stress Target HR: 153 {beats}/min

## 2018-01-14 LAB — NM STRESS TEST WITH MYOCARDIAL PERFUSION
Baseline Diastolic BP: 88 mmHg
Baseline HR: 73 {beats}/min
Baseline Systolic BP: 150 mmHg
Left Ventricular Ejection Fraction: 69
Stress Diastolic BP: 88 mmHg
Stress Peak HR: 82 {beats}/min
Stress Percent HR Achieved: 54 %
Stress Rate Pressure Product: 12300 bpm*mmHg
Stress ST Depression: 0 mm
Stress ST Elevation: 0 mm
Stress Systolic BP: 150 mmHg
Stress Target HR: 153 {beats}/min

## 2018-01-15 NOTE — Telephone Encounter (Signed)
-----   Message from Jason CoopMichael J Wittkamp, MD sent at 01/14/2018  4:15 PM EDT -----  Please advise patient stress test without evidence of flow-limiting blockages in the arteries of the heart- f/u after echo in 3 months if doing well.

## 2018-01-15 NOTE — Telephone Encounter (Signed)
Spoke with patient.  Verified patient with two patient identifiers.    Advised EST normal.  Get echo in 3 months.  Patient verbalized understanding.

## 2018-02-03 ENCOUNTER — Encounter

## 2018-02-16 ENCOUNTER — Inpatient Hospital Stay: Admit: 2018-02-16 | Payer: MEDICARE | Attending: Orthopaedic Surgery | Primary: Internal Medicine

## 2018-02-16 DIAGNOSIS — M7752 Other enthesopathy of left foot: Secondary | ICD-10-CM

## 2018-03-22 ENCOUNTER — Ambulatory Visit: Admit: 2018-03-22 | Discharge: 2018-03-22 | Payer: MEDICARE | Primary: Internal Medicine

## 2018-03-22 ENCOUNTER — Ambulatory Visit

## 2018-03-22 DIAGNOSIS — I350 Nonrheumatic aortic (valve) stenosis: Secondary | ICD-10-CM

## 2018-03-22 NOTE — Progress Notes (Signed)
[name],    Please call the patient and inform that echocardiogram shows essentially unchanged aortic stenosis, however the pressures across the valve (how much force the heart has to exert to get the blood through the valve) have increased some since 09/2017.  I would just monitor this for now and continue with medications as prescribed.  Will discuss further at f/u appointment.    Thanks,  [employer]

## 2018-03-22 NOTE — Progress Notes (Signed)
Virgie,    Please call the patient and inform that echocardiogram shows essentially unchanged aortic stenosis, however the pressures across the valve (how much force the heart has to exert to get the blood through the valve) have increased some since 09/2017.  I would just monitor this for now and continue with medications as prescribed.  Will discuss further at f/u appointment.    Thanks,  Tika

## 2018-03-23 LAB — ECHO ADULT COMPLETE
AV Area by Peak Velocity: 0.9 cm2
AV Area by VTI: 1 cm2
AV Mean Gradient: 35.6 mmHg
AV Peak Gradient: 63.9 mmHg
AV Peak Velocity: 399.65 cm/s
AV VTI: 88.43 cm
AVA/BSA Peak Velocity: 0.4 cm2/m2
AVA/BSA VTI: 0.5 cm2/m2
Aortic Root: 2.94 cm
Ascending Aorta: 3.56 cm
IVSd: 1.58 cm — AB (ref 0.6–0.9)
LA Volume 2C: 51.17 mL (ref 22–52)
LA Volume 4C: 38.01 mL (ref 22–52)
LA Volume BP: 45.53 mL (ref 22–52)
LA Volume Index 2C: 25.17 ml/m2 (ref 16–28)
LA Volume Index 4C: 18.7 ml/m2 (ref 16–28)
LA Volume Index BP: 22.39 ml/m2 (ref 16–28)
LV IVRT: 62.8 ms
LV Mass 2D Index: 126.7 g/m2 — AB (ref 43–95)
LV Mass 2D: 257.6 g — AB (ref 67–162)
LVIDd: 3.52 cm — AB (ref 3.9–5.3)
LVIDs: 1.83 cm
LVOT Cardiac Output: 17.2 L/min
LVOT Diameter: 2.01 cm
LVOT Peak Gradient: 5.5 mmHg
LVOT Peak Velocity: 117.75 cm/s
LVOT SV: 91.9 ml
LVOT VTI: 29.02 cm
LVPWd: 1.62 cm — AB (ref 0.6–0.9)
MV A Velocity: 142.94 cm/s
MV E Velocity: 95.55 cm/s
MV E Wave Deceleration Time: 279.6 ms
MV E/A: 0.67
Pulm Vein A Duration: 122 ms
Pulm Vein A Velocity: 34.33 cm/s

## 2018-03-23 LAB — TRANSTHORACIC ECHOCARDIOGRAM (TTE) COMPLETE (CONTRAST/BUBBLE/3D PRN)
AV Area by Peak Velocity: 0.9 cm2
AV Area by VTI: 1 cm2
AV Mean Gradient: 35.6 mmHg
AV Peak Gradient: 63.9 mmHg
AV Peak Velocity: 399.65 cm/s
AV VTI: 88.43 cm
AVA/BSA Peak Velocity: 0.4 cm2/m2
AVA/BSA VTI: 0.5 cm2/m2
Aortic Root: 2.94 cm
Ascending Aorta: 3.56 cm
IVSd: 1.58 cm — AB (ref 0.6–0.9)
LA Volume 2C: 51.17 mL (ref 22–52)
LA Volume 4C: 38.01 mL (ref 22–52)
LA Volume BP: 45.53 mL (ref 22–52)
LA Volume Index 2C: 25.17 ml/m2 (ref 16–28)
LA Volume Index 4C: 18.7 ml/m2 (ref 16–28)
LA Volume Index BP: 22.39 ml/m2 (ref 16–28)
LV IVRT: 62.8 ms
LV Mass 2D Index: 126.7 g/m2 — AB (ref 43–95)
LV Mass 2D: 257.6 g — AB (ref 67–162)
LVIDd: 3.52 cm — AB (ref 3.9–5.3)
LVIDs: 1.83 cm
LVOT Cardiac Output: 17.2 L/min
LVOT Diameter: 2.01 cm
LVOT Peak Gradient: 5.5 mmHg
LVOT Peak Velocity: 117.75 cm/s
LVOT SV: 91.9 ml
LVOT VTI: 29.02 cm
LVPWd: 1.62 cm — AB (ref 0.6–0.9)
Left Ventricular Ejection Fraction: 63
MV A Velocity: 142.94 cm/s
MV E Velocity: 95.55 cm/s
MV E Wave Deceleration Time: 279.6 ms
MV E/A: 0.67
Pulm Vein A Duration: 122 ms
Pulm Vein A Velocity: 34.33 cm/s

## 2018-03-24 NOTE — Telephone Encounter (Signed)
-----   Message from Donnelly AngelicaKrutika Z McGuiness, NP sent at 03/24/2018  8:59 AM EDT -----  Sherryll BurgerVirgie,    Please call the patient and inform that echocardiogram shows essentially unchanged aortic stenosis, however the pressures across the valve (how much force the heart has to exert to get the blood through the valve) have increased some since 09/2017.  I would just monitor this for now and continue with medications as prescribed.  Will discuss further at f/u appointment.    Thanks,  [employer]

## 2018-03-24 NOTE — Telephone Encounter (Signed)
Patient informed appt 04/12/18 scheduled with Dr Tobey BrideWittkamp

## 2018-04-02 ENCOUNTER — Ambulatory Visit
Admit: 2018-04-02 | Discharge: 2018-04-02 | Payer: MEDICARE | Attending: Cardiovascular Disease | Primary: Internal Medicine

## 2018-04-02 DIAGNOSIS — I350 Nonrheumatic aortic (valve) stenosis: Secondary | ICD-10-CM

## 2018-04-02 NOTE — Progress Notes (Signed)
 1. Have you been to the ER, urgent care clinic since your last visit?  Hospitalized since your last visit?No    2. Have you seen or consulted any other health care providers outside of the [facility] since your last visit?  Include any pap smears or colon screening. No    Chief Complaint   Patient presents with   . Valvular Heart Disease     3 mo appt.  Discuss echo and EST results.  C/O SOB.

## 2018-04-02 NOTE — Progress Notes (Signed)
Progress Notes by Jeanie Mccard, Murvin Natal, NP at 04/02/18 1100                Author: Carroll Kinds, Murvin Natal, NP  Service: --  Author Type: Nurse Practitioner       Filed: 04/02/18 1153  Encounter Date: 04/02/2018  Status: Signed          Editor: Solina Heron, Murvin Natal, NP (Nurse Practitioner)                                     Loralee Pacas, FNP-BC      Subjective/HPI:       [name] is a  67 y.o. female is here for routine f/u.  She has a PMHx of aortic stenosis, HTN, HLD and obesity.      She notes ongoing shortness of breath with exertion.  This has not improved or worsened.  She does find it limiting with activity.   She denies chest pain with exertion.  She denies orthopnea, or PND.  She notes some edema, but this is controlled.   She reports erratic blood pressures, sometimes in the 120s/70s and other times in 140s/80s.   She does not check her blood pressure at home regularly.                  PCP Provider   [name], MD        Patient Active Problem List        Diagnosis  Code         ?  Obesity, morbid ([place])  E66.01          Past Surgical History:         Procedure  Laterality  Date          ?  HX HYSTERECTOMY         ?  HX ORTHOPAEDIC              right rotator cuff repair          ?  HX ORTHOPAEDIC              l4-l5 spinal fusion          ?  HX ORTHOPAEDIC    08/23/12          RIGHT L4 AND L5 DECOMPRESSION          ?  HX OTHER SURGICAL              cystectomy right shoulder blade          ?  HX OTHER SURGICAL              wisdom teeth extraction          ?  HX TUBAL LIGATION              Family History         Problem  Relation  Age of Onset          ?  Lung Disease  Mother       ?  Hypertension  Mother            ?  Heart Disease  Father            Social History          Socioeconomic History         ?  Marital status:  MARRIED  Spouse name:  Not on file         ?  Number of children:  Not on file     ?  Years of education:  Not on file     ?  Highest education level:  Not on  file       Occupational History        ?  Not on file       Social Needs         ?  Financial resource strain:  Not on file        ?  Food insecurity:              Worry:  Not on file         Inability:  Not on file        ?  Transportation needs:              Medical:  Not on file         Non-medical:  Not on file       Tobacco Use         ?  Smoking status:  Former Smoker              Packs/day:  0.50         Years:  5.00         Pack years:  2.50         Last attempt to quit:  08/19/1975         Years since quitting:  42.6         ?  Smokeless tobacco:  Never Used       Substance and Sexual Activity         ?  Alcohol use:  Yes             Comment: occassionally         ?  Drug use:  No     ?  Sexual activity:  Not on file       Lifestyle        ?  Physical activity:              Days per week:  Not on file         Minutes per session:  Not on file         ?  Stress:  Not on file       Relationships        ?  Social connections:              Talks on phone:  Not on file         Gets together:  Not on file         Attends religious service:  Not on file         Active member of club or organization:  Not on file         Attends meetings of clubs or organizations:  Not on file         Relationship status:  Not on file        ?  Intimate partner violence:              Fear of current or ex partner:  Not on file         Emotionally abused:  Not on file         Physically abused:  Not on file  Forced sexual activity:  Not on file        Other Topics  Concern        ?  Not on file       Social History Narrative        ?  Not on file             Allergies        Allergen  Reactions         ?  Adhesive  Rash     ?  Dobutamine  Other (comments)             Pt had a paradoxical reaction during a stress test and dropped her heart rate and b/p.  See stress test notes in cc.         ?  Sulfa (Sulfonamide Antibiotics)  Rash              Current Outpatient Medications        Medication  Sig         ?  acetaminophen  (TYLENOL) 325 mg tablet  Take  by mouth every four (4) hours as needed for Pain.     ?  telmisartan (MICARDIS) 80 mg tablet  TAKE 1 TABLET BY MOUTH EVERY DAY     ?  indapamide (LOZOL) 1.25 mg tablet  TAKE 1 TABLET BY MOUTH EVERY DAY IN THE MORNING     ?  OTHER  daily. Tumeric with ginger      ?  APPLE CIDER VINEGAR PO  Take  by mouth daily.     ?  rosuvastatin (CRESTOR) 5 mg tablet  Take 5 mg by mouth daily.     ?  traMADol (ULTRAM) 50 mg tablet  Take 50 mg by mouth two (2) times daily as needed.     ?  desvenlafaxine succinate (PRISTIQ) 50 mg ER tablet  Take 50 mg by mouth daily.     ?  amLODIPine (NORVASC) 10 mg tablet  Take 10 mg by mouth daily.     ?  DULoxetine (CYMBALTA) 60 mg capsule  Take 60 mg by mouth daily.     ?  omeprazole (PRILOSEC) 20 mg capsule  Take 20 mg by mouth daily.     ?  cholecalciferol (VITAMIN D3) 1,000 unit tablet  Take  by mouth daily.         ?  aspirin delayed-release 81 mg tablet  Take 81 mg by mouth daily.          No current facility-administered medications for this visit.             I have reviewed the problem list, allergy list, medical history, family, social history and medications.      Review of Symptoms:      Review of Systems    Constitutional: Negative for chills, fever and weight loss.    HENT: Negative for nosebleeds.     Eyes: Negative for blurred vision and double vision.    Respiratory: Positive for shortness of breath. Negative for cough and wheezing.     Cardiovascular: Negative for chest pain, palpitations, orthopnea, leg swelling and PND.    Gastrointestinal: Negative for abdominal pain, blood in stool, diarrhea, nausea and vomiting.    Musculoskeletal: Negative for joint pain.    Skin: Negative for rash.    Neurological: Negative for dizziness, tingling and loss of consciousness.    Endo/Heme/Allergies: Does not bruise/bleed easily.  Physical Exam:        General: Well developed, in no acute distress, cooperative and alert   HEENT: No carotid bruits, no  JVD, trach is midline. Neck Supple, PEERL, EOM intact.   Heart:  reg rate and rhythm; normal S1/S2; 2/6 crescendo-decrescendo murmur radiating  up to the carotids, gallops or rubs.    Respiratory: Clear bilaterally x 4, no wheezing or rales   Abdomen:   Soft, non-tender, no distention, no masses. + BS.    Extremities:  Normal cap refill, no cyanosis, atraumatic.  No edema.   Neuro: A&Ox3, speech clear, gait stable.    Skin: Skin color is normal. No rashes or lesions. Non diaphoretic   Vascular: 2+ pulses symmetric in all extremities        Vitals:           04/02/18 1101  04/02/18 1109         BP:  140/80  142/84     Pulse:  80       Resp:  18       SpO2:  96%       Weight:  230 lb (104.3 kg)           Height:  5' 3"  (1.6 m)             Cardiographics      ECG: sinus rhythm     Results for orders placed or performed during the hospital encounter of 11/24/17     EKG, 12 LEAD, INITIAL         Result  Value  Ref Range            Ventricular Rate  81  BPM       Atrial Rate  81  BPM       P-R Interval  154  ms       QRS Duration  92  ms       Q-T Interval  400  ms       QTC Calculation (Bezet)  464  ms       Calculated P Axis  40  degrees       Calculated T Axis  113  degrees       Diagnosis                 Normal sinus rhythm   T wave abnormality, consider lateral ischemia   When compared with ECG of 28-Jun-2012 10:15,   T wave inversion now evident in Lateral leads   Confirmed by Olegario Shearer ([id number]) on 11/25/2017 11:55:40 AM              Cardiology Labs:   No results found for: CHOL, CHOLX, CHLST, CHOLV, 884269, HDL, LDL, LDLC, DLDLP, TGLX, TRIGL, TRIGP, CHHD, CHHDX        Lab Results         Component  Value  Date/Time            Sodium  140  11/24/2017 11:15 AM       Potassium  2.9 (L)  11/24/2017 11:15 AM       Chloride  103  11/24/2017 11:15 AM       CO2  28  11/24/2017 11:15 AM       Anion gap  9  11/24/2017 11:15 AM       Glucose  136 (H)  11/24/2017 11:15 AM       BUN  12  11/24/2017 11:15 AM  Creatinine  0.75  11/24/2017 11:15 AM       BUN/Creatinine ratio  16  11/24/2017 11:15 AM       GFR est AA  >60  11/24/2017 11:15 AM       GFR est non-AA  >60  11/24/2017 11:15 AM       Calcium  8.3 (L)  11/24/2017 11:15 AM       Bilirubin, total  0.4  11/24/2017 11:15 AM       AST (SGOT)  14 (L)  11/24/2017 11:15 AM       Alk. phosphatase  83  11/24/2017 11:15 AM       Protein, total  7.5  11/24/2017 11:15 AM       Albumin  3.7  11/24/2017 11:15 AM       Globulin  3.8  11/24/2017 11:15 AM       A-G Ratio  1.0 (L)  11/24/2017 11:15 AM            ALT (SGPT)  27  11/24/2017 11:15 AM              Orders Placed This Encounter        ?  AMB POC EKG ROUTINE W/ 12 LEADS, INTER & REP              Order Specific Question:    Reason for Exam:         Answer:    routine        ?  acetaminophen (TYLENOL) 325 mg tablet             Sig: Take  by mouth every four (4) hours as needed for Pain.              Assessment:       Assessment:                  ICD-10-CM  ICD-9-CM             1.  Aortic valve stenosis, etiology of cardiac valve disease unspecified  I35.0  424.1  AMB POC EKG ROUTINE W/ 12 LEADS, INTER & REP     2.  Essential hypertension  I10  401.9             3.  Mixed hyperlipidemia  E78.2  272.2                Plan:        1. Aortic valve stenosis, etiology of cardiac valve disease unspecified   Echo done 03/2018 with preserved LVEF 60-65% with moderate AS, Vmax 3.9 ms/ and mean PG 35, peak PG 63 mmHg   Suspect aortic valve is more severe than appears on echocardiogram, will obtain TEE to assess severity of valve.   Continue present management for now.   - AMB POC EKG ROUTINE W/ 12 LEADS, INTER & REP      2. Essential hypertension   Recommend to check BP daily, same time every day, preferably 1-2 hours after AM meds   May need further adjustments   Continue low sodium diet      3. Mixed hyperlipidemia   Continue statin therapy and low fat, low cholesterol diet   Lipids managed by PCP      F/u after testing complete.          Preston Fleeting, NP

## 2018-04-02 NOTE — Progress Notes (Signed)
1. Have you been to the ER, urgent care clinic since your last visit?  Hospitalized since your last visit?No    2. Have you seen or consulted any other health care providers outside of the [facility] since your last visit?  Include any pap smears or colon screening. No    Chief Complaint   Patient presents with   ??? Valvular Heart Disease     3 mo appt.  Discuss echo and EST results.  C/O SOB.

## 2018-04-02 NOTE — Progress Notes (Signed)
Tika Vaudine Dutan, FNP-BC    Subjective/HPI:     [name] is a 67 y.o. female is here for routine f/u. She has a PMHx of aortic stenosis, HTN, HLD and obesity.    She notes ongoing shortness of breath with exertion.  This has not improved or worsened.  She does find it limiting with activity.  She denies chest pain with exertion.  She denies orthopnea, or PND.  She notes some edema, but this is controlled.  She reports erratic blood pressures, sometimes in the 120s/70s and other times in 140s/80s.  She does not check her blood pressure at home regularly.             PCP Provider  [name], MD    Patient Active Problem List   Diagnosis Code   ??? Obesity, morbid ([place]) E66.01     Past Surgical History:   Procedure Laterality Date   ??? HX HYSTERECTOMY     ??? HX ORTHOPAEDIC      right rotator cuff repair   ??? HX ORTHOPAEDIC      l4-l5 spinal fusion   ??? HX ORTHOPAEDIC  08/23/12    RIGHT L4 AND L5 DECOMPRESSION   ??? HX OTHER SURGICAL      cystectomy right shoulder blade   ??? HX OTHER SURGICAL      wisdom teeth extraction   ??? HX TUBAL LIGATION       Family History   Problem Relation Age of Onset   ??? Lung Disease Mother    ??? Hypertension Mother    ??? Heart Disease Father      Social History     Socioeconomic History   ??? Marital status: MARRIED     Spouse name: Not on file   ??? Number of children: Not on file   ??? Years of education: Not on file   ??? Highest education level: Not on file   Occupational History   ??? Not on file   Social Needs   ??? Financial resource strain: Not on file   ??? Food insecurity:     Worry: Not on file     Inability: Not on file   ??? Transportation needs:     Medical: Not on file     Non-medical: Not on file   Tobacco Use   ??? Smoking status: Former Smoker     Packs/day: 0.50     Years: 5.00     Pack years: 2.50     Last attempt to quit: 08/19/1975     Years since quitting: 42.6   ??? Smokeless tobacco: Never Used   Substance and Sexual Activity   ??? Alcohol use: Yes     Comment: occassionally    ??? Drug use: No   ??? Sexual activity: Not on file   Lifestyle   ??? Physical activity:     Days per week: Not on file     Minutes per session: Not on file   ??? Stress: Not on file   Relationships   ??? Social connections:     Talks on phone: Not on file     Gets together: Not on file     Attends religious service: Not on file     Active member of club or organization: Not on file     Attends meetings of clubs or organizations: Not on file     Relationship status: Not on file   ??? Intimate partner violence:     Fear of  current or ex partner: Not on file     Emotionally abused: Not on file     Physically abused: Not on file     Forced sexual activity: Not on file   Other Topics Concern   ??? Not on file   Social History Narrative   ??? Not on file       Allergies   Allergen Reactions   ??? Adhesive Rash   ??? Dobutamine Other (comments)     Pt had a paradoxical reaction during a stress test and dropped her heart rate and b/p.  See stress test notes in cc.   ??? Sulfa (Sulfonamide Antibiotics) Rash        Current Outpatient Medications   Medication Sig   ??? acetaminophen (TYLENOL) 325 mg tablet Take  by mouth every four (4) hours as needed for Pain.   ??? telmisartan (MICARDIS) 80 mg tablet TAKE 1 TABLET BY MOUTH EVERY DAY   ??? indapamide (LOZOL) 1.25 mg tablet TAKE 1 TABLET BY MOUTH EVERY DAY IN THE MORNING   ??? OTHER daily. Tumeric with ginger    ??? APPLE CIDER VINEGAR PO Take  by mouth daily.   ??? rosuvastatin (CRESTOR) 5 mg tablet Take 5 mg by mouth daily.   ??? traMADol (ULTRAM) 50 mg tablet Take 50 mg by mouth two (2) times daily as needed.   ??? desvenlafaxine succinate (PRISTIQ) 50 mg ER tablet Take 50 mg by mouth daily.   ??? amLODIPine (NORVASC) 10 mg tablet Take 10 mg by mouth daily.   ??? DULoxetine (CYMBALTA) 60 mg capsule Take 60 mg by mouth daily.   ??? omeprazole (PRILOSEC) 20 mg capsule Take 20 mg by mouth daily.   ??? cholecalciferol (VITAMIN D3) 1,000 unit tablet Take  by mouth daily.    ??? aspirin delayed-release 81 mg tablet Take 81 mg by mouth daily.     No current facility-administered medications for this visit.         I have reviewed the problem list, allergy list, medical history, family, social history and medications.    Review of Symptoms:    Review of Systems   Constitutional: Negative for chills, fever and weight loss.   HENT: Negative for nosebleeds.    Eyes: Negative for blurred vision and double vision.   Respiratory: Positive for shortness of breath. Negative for cough and wheezing.    Cardiovascular: Negative for chest pain, palpitations, orthopnea, leg swelling and PND.   Gastrointestinal: Negative for abdominal pain, blood in stool, diarrhea, nausea and vomiting.   Musculoskeletal: Negative for joint pain.   Skin: Negative for rash.   Neurological: Negative for dizziness, tingling and loss of consciousness.   Endo/Heme/Allergies: Does not bruise/bleed easily.       Physical Exam:      General: Well developed, in no acute distress, cooperative and alert  HEENT: No carotid bruits, no JVD, trach is midline. Neck Supple, PEERL, EOM intact.  Heart:  reg rate and rhythm; normal S1/S2; 2/6 crescendo-decrescendo murmur radiating up to the carotids, gallops or rubs.   Respiratory: Clear bilaterally x 4, no wheezing or rales  Abdomen:   Soft, non-tender, no distention, no masses. + BS.   Extremities:  Normal cap refill, no cyanosis, atraumatic.  No edema.  Neuro: A&Ox3, speech clear, gait stable.   Skin: Skin color is normal. No rashes or lesions. Non diaphoretic  Vascular: 2+ pulses symmetric in all extremities    Vitals:    04/02/18 1101 04/02/18 1109  BP: 140/80 142/84   Pulse: 80    Resp: 18    SpO2: 96%    Weight: 230 lb (104.3 kg)    Height: 5' 3" (1.6 m)        Cardiographics    ECG: sinus rhythm  Results for orders placed or performed during the hospital encounter of 11/24/17   EKG, 12 LEAD, INITIAL   Result Value Ref Range    Ventricular Rate 81 BPM    Atrial Rate 81 BPM     P-R Interval 154 ms    QRS Duration 92 ms    Q-T Interval 400 ms    QTC Calculation (Bezet) 464 ms    Calculated P Axis 40 degrees    Calculated T Axis 113 degrees    Diagnosis       Normal sinus rhythm  T wave abnormality, consider lateral ischemia  When compared with ECG of 28-Jun-2012 10:15,  T wave inversion now evident in Lateral leads  Confirmed by Olegario Shearer [phone]) on 11/25/2017 11:55:40 AM         Cardiology Labs:  No results found for: CHOL, CHOLX, CHLST, CHOLV, 884269, HDL, LDL, LDLC, DLDLP, TGLX, TRIGL, TRIGP, CHHD, CHHDX    Lab Results   Component Value Date/Time    Sodium 140 11/24/2017 11:15 AM    Potassium 2.9 (L) 11/24/2017 11:15 AM    Chloride 103 11/24/2017 11:15 AM    CO2 28 11/24/2017 11:15 AM    Anion gap 9 11/24/2017 11:15 AM    Glucose 136 (H) 11/24/2017 11:15 AM    BUN 12 11/24/2017 11:15 AM    Creatinine 0.75 11/24/2017 11:15 AM    BUN/Creatinine ratio 16 11/24/2017 11:15 AM    GFR est AA >60 11/24/2017 11:15 AM    GFR est non-AA >60 11/24/2017 11:15 AM    Calcium 8.3 (L) 11/24/2017 11:15 AM    Bilirubin, total 0.4 11/24/2017 11:15 AM    AST (SGOT) 14 (L) 11/24/2017 11:15 AM    Alk. phosphatase 83 11/24/2017 11:15 AM    Protein, total 7.5 11/24/2017 11:15 AM    Albumin 3.7 11/24/2017 11:15 AM    Globulin 3.8 11/24/2017 11:15 AM    A-G Ratio 1.0 (L) 11/24/2017 11:15 AM    ALT (SGPT) 27 11/24/2017 11:15 AM        Orders Placed This Encounter   ??? AMB POC EKG ROUTINE W/ 12 LEADS, INTER & REP     Order Specific Question:   Reason for Exam:     Answer:   routine   ??? acetaminophen (TYLENOL) 325 mg tablet     Sig: Take  by mouth every four (4) hours as needed for Pain.        Assessment:     Assessment:       ICD-10-CM ICD-9-CM    1. Aortic valve stenosis, etiology of cardiac valve disease unspecified I35.0 424.1 AMB POC EKG ROUTINE W/ 12 LEADS, INTER & REP   2. Essential hypertension I10 401.9    3. Mixed hyperlipidemia E78.2 272.2         Plan:      1. Aortic valve stenosis, etiology of cardiac valve disease unspecified  Echo done 03/2018 with preserved LVEF 60-65% with moderate AS, Vmax 3.9 ms/ and mean PG 35, peak PG 63 mmHg  Suspect aortic valve is more severe than appears on echocardiogram, will obtain TEE to assess severity of valve.  Continue present management for now.  - AMB POC  EKG ROUTINE W/ 12 LEADS, INTER & REP    2. Essential hypertension  Recommend to check BP daily, same time every day, preferably 1-2 hours after AM meds  May need further adjustments  Continue low sodium diet    3. Mixed hyperlipidemia  Continue statin therapy and low fat, low cholesterol diet  Lipids managed by PCP    F/u after testing complete.      Preston Fleeting, NP

## 2018-04-06 ENCOUNTER — Inpatient Hospital Stay: Admit: 2018-04-06 | Payer: MEDICARE | Primary: Internal Medicine

## 2018-04-06 DIAGNOSIS — I350 Nonrheumatic aortic (valve) stenosis: Secondary | ICD-10-CM

## 2018-04-06 MED ORDER — LIDOCAINE 2 % MUCOSAL SOLN
2 % | Status: AC
Start: 2018-04-06 — End: ?

## 2018-04-06 MED ORDER — MIDAZOLAM 1 MG/ML IJ SOLN
1 mg/mL | INTRAMUSCULAR | Status: DC | PRN
Start: 2018-04-06 — End: 2018-04-06
  Administered 2018-04-06 (×5): via INTRAVENOUS

## 2018-04-06 MED ORDER — FENTANYL CITRATE (PF) 50 MCG/ML IJ SOLN
50 mcg/mL | INTRAMUSCULAR | Status: DC | PRN
Start: 2018-04-06 — End: 2018-04-06
  Administered 2018-04-06 (×3): via INTRAVENOUS

## 2018-04-06 MED ORDER — BUTAMBEN-TETRACAINE-BENZOCAINE 2 %-2 %-14 % TOPICAL SPRAY
2 %- %-14 % (00 mg/sec) | Freq: Once | CUTANEOUS | Status: AC | PRN
Start: 2018-04-06 — End: 2018-04-06
  Administered 2018-04-06: 17:00:00 via TOPICAL

## 2018-04-06 MED ORDER — INDAPAMIDE 1.25 MG TAB
1.25 mg | ORAL_TABLET | ORAL | 3 refills | Status: DC
Start: 2018-04-06 — End: 2018-04-26

## 2018-04-06 MED ORDER — MIDAZOLAM 1 MG/ML IJ SOLN
1 mg/mL | INTRAMUSCULAR | Status: AC
Start: 2018-04-06 — End: ?

## 2018-04-06 MED ORDER — FENTANYL CITRATE (PF) 50 MCG/ML IJ SOLN
50 mcg/mL | INTRAMUSCULAR | Status: AC
Start: 2018-04-06 — End: ?

## 2018-04-06 MED ORDER — LIDOCAINE 2 % MUCOSAL SOLN
2 % | Status: DC | PRN
Start: 2018-04-06 — End: 2018-04-06
  Administered 2018-04-06: 17:00:00 via OROMUCOSAL

## 2018-04-06 MED FILL — CETACAINE 2 %-2 %-14 % (200 MG/SEC) TOPICAL SPRAY: 2 %- %-14 % (00 mg/sec) | CUTANEOUS | Qty: 5

## 2018-04-06 MED FILL — MIDAZOLAM 1 MG/ML IJ SOLN: 1 mg/mL | INTRAMUSCULAR | Qty: 10

## 2018-04-06 MED FILL — LIDOCAINE VISCOUS 2 % MUCOSAL SOLUTION: 2 % | Qty: 15

## 2018-04-06 MED FILL — FENTANYL CITRATE (PF) 50 MCG/ML IJ SOLN: 50 mcg/mL | INTRAMUSCULAR | Qty: 2

## 2018-04-06 NOTE — Progress Notes (Signed)
Patient arrived to Non-Invasive Cardiology Lab for Out Patient tee Procedure. Staff introduced to patient. Patient identifiers verified with Name and Date of Birth. Procedure verified with patient. Consent forms reviewed and signed by patient or authorized representative and verified. Allergies verified. Patient informed of procedure and plan of care. Questions answered with review.     Patient on cardiac monitor, non-invasive blood pressure, SPO2 monitor. On Room Air. Patient is A&Ox3. Patient reports no complaints.    Patient on stretcher, in low position, with side rails up. Patient instructed to call for assistance as needed.    Family in waiting room.

## 2018-04-06 NOTE — Progress Notes (Signed)
Reviewed; will discuss at upcoming appt 04/26/18

## 2018-04-06 NOTE — Progress Notes (Signed)
Pt sedated with 5mg  Versed and 75mcg Fentanyl for TEE (monitored sedation from 1248 to 1328)

## 2018-04-06 NOTE — Progress Notes (Signed)
Discharge instructions reviewed with patient and husband, Louanne SkyeDick. Allowed adequate time to ask questions, all questions answered. Printed copy of AVS given to patient. All belongings gathered, IV and tele discontinued. Transported via wheelchair to main entrance and into care of family.

## 2018-04-06 NOTE — Progress Notes (Signed)
Patient arrived to Non-Invasive Cardiology Lab for Out Patient tee Procedure. Staff introduced to patient. Patient identifiers verified with Name and Date of Birth. Procedure verified with patient. Consent forms reviewed and signed by patient or authorized representative and verified. Allergies verified. Patient informed of procedure and plan of care. Questions answered with review.     Patient on cardiac monitor, non-invasive blood pressure, SPO2 monitor. On Room Air. Patient is A&Ox3. Patient reports no complaints.    Patient on stretcher, in low position, with side rails up. Patient instructed to call for assistance as needed.    Family in waiting room.

## 2018-04-06 NOTE — Progress Notes (Signed)
Discharge instructions reviewed with patient and husband, Dick. Allowed adequate time to ask questions, all questions answered. Printed copy of AVS given to patient. All belongings gathered, IV and tele discontinued. Transported via wheelchair to main entrance and into care of family.

## 2018-04-06 NOTE — Telephone Encounter (Signed)
Patient would like to pick this up today after her TEE with Dr. Tobey BrideWittkamp today.  Thanks!

## 2018-04-06 NOTE — Progress Notes (Signed)
Pt sedated with 5mg Versed and 75mcg Fentanyl for TEE (monitored sedation from 1248 to 1328)

## 2018-04-07 LAB — ECHO TEE W OR WO CONTRAST
AV Area by Peak Velocity: 1 cm2
AV Area by Planimetry: 1.6 cm2
AV Area by VTI: 1.3 cm2
AV Mean Gradient: 25.5 mmHg
AV Peak Gradient: 39.3 mmHg
AV Peak Velocity: 313.43 cm/s
AV VTI: 67.18 cm
Est. RA Pressure: 10 mmHg
LVOT Diameter: 1.78 cm
LVOT Peak Gradient: 6.6 mmHg
LVOT Peak Velocity: 128.28 cm/s
LVOT SV: 89.7 ml
LVOT VTI: 36.18 cm
PASP: 30.3 mmHg
RVSP: 30.3 mmHg
TR Max Velocity: 225.47 cm/s
TR Peak Gradient: 20.3 mmHg

## 2018-04-07 LAB — TRANSESOPHAGEAL ECHOCARDIOGRAM (CONTRAST/3D PRN)
AV Area by Peak Velocity: 1 cm2
AV Area by Planimetry: 1.6 cm2
AV Area by VTI: 1.3 cm2
AV Mean Gradient: 25.5 mmHg
AV Peak Gradient: 39.3 mmHg
AV Peak Velocity: 313.43 cm/s
AV VTI: 67.18 cm
Est. RA Pressure: 10 mmHg
LVOT Diameter: 1.78 cm
LVOT Peak Gradient: 6.6 mmHg
LVOT Peak Velocity: 128.28 cm/s
LVOT SV: 89.7 ml
LVOT VTI: 36.18 cm
Left Ventricular Ejection Fraction: 58
PASP: 30.3 mmHg
RVSP: 30.3 mmHg
TR Max Velocity: 225.47 cm/s
TR Peak Gradient: 20.3 mmHg

## 2018-04-08 NOTE — Progress Notes (Signed)
Reviewed; will discuss at upcoming appt 04/26/18

## 2018-04-26 ENCOUNTER — Ambulatory Visit: Admit: 2018-04-26 | Discharge: 2018-04-26 | Payer: MEDICARE | Attending: Nurse Practitioner | Primary: Internal Medicine

## 2018-04-26 ENCOUNTER — Ambulatory Visit: Attending: Nurse Practitioner | Primary: Internal Medicine

## 2018-04-26 DIAGNOSIS — I350 Nonrheumatic aortic (valve) stenosis: Secondary | ICD-10-CM

## 2018-04-26 MED ORDER — INDAPAMIDE 2.5 MG TAB
2.5 mg | ORAL_TABLET | Freq: Every day | ORAL | 1 refills | Status: DC
Start: 2018-04-26 — End: 2018-10-22

## 2018-04-26 NOTE — Progress Notes (Signed)
Progress Notes by Scarlett Presto, MD at 04/26/18 1330                Author: Scarlett Presto, MD  Service: --  Author Type: Physician       Filed: 04/26/18 1450  Encounter Date: 04/26/2018  Status: Signed          Editor: Kiran Lapine, Christian Mate, MD (Physician)                                     Loralee Pacas, FNP-BC      Subjective/HPI:       [name] is a  67 y.o. female is here for hospital f/u from TEE.  She has a PMHx of aortic stenosis, HTN, HLD and obesity.      At last visit, she underwent TEE to evaluate for severity of aortic stenosis, given ongoing symptoms of shortness of breath with exertion.   TEE suggested moderate aortic stenosis, with preserved ejection fraction.      She continues to remain short of breath with exertion.  She is scheduled for sleep study but does not believe she has sleep apnea.   She reports BP at home in the range of 130-150s/80s.      TEE (04/06/18):   ??  Left Ventricle: Estimated left ventricular ejection fraction is 56 - 60%.   ??  Left Atrium: Dilated left atrium.   ??  Aortic Valve: Moderate aortic valve focal thickening present. Moderate aortic valve leaflet calcification present with reduced excursion. Aortic valve mean gradient is 25 mmHg. Moderate aortic valve stenosis is present. Mild aortic valve regurgitation  is present.   ??  AVA VTI 1.3 cm2. AVA by planimetry 1.5 cm2.   ??  Mitral Valve: Mild mitral valve regurgitation.               PCP Provider   [name], MD        Patient Active Problem List        Diagnosis  Code         ?  Obesity, morbid ([place])  E66.01          Past Surgical History:         Procedure  Laterality  Date          ?  HX HYSTERECTOMY         ?  HX ORTHOPAEDIC              right rotator cuff repair          ?  HX ORTHOPAEDIC              l4-l5 spinal fusion          ?  HX ORTHOPAEDIC    08/23/12          RIGHT L4 AND L5 DECOMPRESSION          ?  HX OTHER SURGICAL              cystectomy right shoulder blade          ?  HX OTHER  SURGICAL              wisdom teeth extraction          ?  HX TUBAL LIGATION              Family History  Problem  Relation  Age of Onset          ?  Lung Disease  Mother       ?  Hypertension  Mother            ?  Heart Disease  Father            Social History          Socioeconomic History         ?  Marital status:  MARRIED              Spouse name:  Not on file         ?  Number of children:  Not on file     ?  Years of education:  Not on file     ?  Highest education level:  Not on file       Occupational History        ?  Not on file       Social Needs         ?  Financial resource strain:  Not on file        ?  Food insecurity:              Worry:  Not on file         Inability:  Not on file        ?  Transportation needs:              Medical:  Not on file         Non-medical:  Not on file       Tobacco Use         ?  Smoking status:  Former Smoker              Packs/day:  0.50         Years:  5.00         Pack years:  2.50         Last attempt to quit:  08/19/1975         Years since quitting:  42.7         ?  Smokeless tobacco:  Never Used       Substance and Sexual Activity         ?  Alcohol use:  Yes             Comment: occassionally         ?  Drug use:  No     ?  Sexual activity:  Not on file       Lifestyle        ?  Physical activity:              Days per week:  Not on file         Minutes per session:  Not on file         ?  Stress:  Not on file       Relationships        ?  Social connections:              Talks on phone:  Not on file         Gets together:  Not on file         Attends religious service:  Not on file         Active member of club or organization:  Not on file  Attends meetings of clubs or organizations:  Not on file         Relationship status:  Not on file        ?  Intimate partner violence:              Fear of current or ex partner:  Not on file         Emotionally abused:  Not on file         Physically abused:  Not on file         Forced sexual activity:  Not  on file        Other Topics  Concern        ?  Not on file       Social History Narrative        ?  Not on file             Allergies        Allergen  Reactions         ?  Adhesive  Rash     ?  Dobutamine  Other (comments)             Pt had a paradoxical reaction during a stress test and dropped her heart rate and b/p.  See stress test notes in cc.         ?  Sulfa (Sulfonamide Antibiotics)  Rash              Current Outpatient Medications        Medication  Sig         ?  indapamide (LOZOL) 2.5 mg tablet  Take 1 Tab by mouth daily.     ?  cyclobenzaprine (FLEXERIL) 10 mg tablet  Take  by mouth nightly as needed for Muscle Spasm(s).     ?  acetaminophen (TYLENOL) 325 mg tablet  Take  by mouth every four (4) hours as needed for Pain.     ?  telmisartan (MICARDIS) 80 mg tablet  TAKE 1 TABLET BY MOUTH EVERY DAY     ?  OTHER  daily. Tumeric with ginger      ?  APPLE CIDER VINEGAR PO  Take  by mouth daily.     ?  rosuvastatin (CRESTOR) 5 mg tablet  Take 5 mg by mouth daily.     ?  traMADol (ULTRAM) 50 mg tablet  Take 50 mg by mouth two (2) times daily as needed.     ?  desvenlafaxine succinate (PRISTIQ) 50 mg ER tablet  Take 50 mg by mouth daily.     ?  amLODIPine (NORVASC) 10 mg tablet  Take 10 mg by mouth daily.     ?  DULoxetine (CYMBALTA) 60 mg capsule  Take 60 mg by mouth daily.         ?  omeprazole (PRILOSEC) 40 mg capsule  Take 40 mg by mouth daily.          No current facility-administered medications for this visit.             I have reviewed the problem list, allergy list, medical history, family, social history and medications.      Review of Symptoms:      Review of Systems    Constitutional: Negative for chills, fever and weight loss.    HENT: Negative for nosebleeds.     Eyes: Negative for blurred vision and double vision.    Respiratory: Positive for shortness  of breath. Negative for cough and wheezing.     Cardiovascular: Negative for chest pain, palpitations, orthopnea, leg swelling and PND.     Gastrointestinal: Negative for abdominal pain, blood in stool, diarrhea, nausea and vomiting.    Musculoskeletal: Negative for joint pain.    Skin: Negative for rash.    Neurological: Negative for dizziness, tingling and loss of consciousness.    Endo/Heme/Allergies: Does not bruise/bleed easily.          Physical Exam:        General: Well developed, in no acute distress, cooperative and alert   HEENT: No carotid bruits, no JVD, trach is midline. Neck Supple, PEERL, EOM intact.   Heart:  reg rate and rhythm; normal S1/S2; 2/6 crescendo-decrescendo murmur radiating  up to the carotids, gallops or rubs.    Respiratory: Clear bilaterally x 4, no wheezing or rales   Abdomen:   Soft, non-tender, no distention, no masses. + BS.    Extremities:  Normal cap refill, no cyanosis, atraumatic.  No edema.   Neuro: A&Ox3, speech clear, gait stable.    Skin: Skin color is normal. No rashes or lesions. Non diaphoretic   Vascular: 2+ pulses symmetric in all extremities        Vitals:           04/26/18 1339  04/26/18 1346         BP:  130/80  128/80     Pulse:  86       Resp:  16       SpO2:  96%       Weight:  231 lb 12.8 oz (105.1 kg)           Height:  5' 3"  (1.6 m)             Cardiographics      ECG: sinus rhythm; PVC     Results for orders placed or performed during the hospital encounter of 11/24/17     EKG, 12 LEAD, INITIAL         Result  Value  Ref Range            Ventricular Rate  81  BPM       Atrial Rate  81  BPM       P-R Interval  154  ms       QRS Duration  92  ms       Q-T Interval  400  ms       QTC Calculation (Bezet)  464  ms       Calculated P Axis  40  degrees       Calculated T Axis  113  degrees       Diagnosis                 Normal sinus rhythm   T wave abnormality, consider lateral ischemia   When compared with ECG of 28-Jun-2012 10:15,   T wave inversion now evident in Lateral leads   Confirmed by Olegario Shearer ([id number]) on 11/25/2017 11:55:40 AM              Cardiology Labs:   No results found for:  CHOL, CHOLX, CHLST, CHOLV, 884269, HDL, LDL, LDLC, DLDLP, TGLX, TRIGL, TRIGP, CHHD, CHHDX        Lab Results         Component  Value  Date/Time            Sodium  140  11/24/2017 11:15 AM       Potassium  2.9 (L)  11/24/2017 11:15 AM       Chloride  103  11/24/2017 11:15 AM       CO2  28  11/24/2017 11:15 AM       Anion gap  9  11/24/2017 11:15 AM       Glucose  136 (H)  11/24/2017 11:15 AM       BUN  12  11/24/2017 11:15 AM       Creatinine  0.75  11/24/2017 11:15 AM       BUN/Creatinine ratio  16  11/24/2017 11:15 AM       GFR est AA  >60  11/24/2017 11:15 AM       GFR est non-AA  >60  11/24/2017 11:15 AM       Calcium  8.3 (L)  11/24/2017 11:15 AM       Bilirubin, total  0.4  11/24/2017 11:15 AM       AST (SGOT)  14 (L)  11/24/2017 11:15 AM       Alk. phosphatase  83  11/24/2017 11:15 AM       Protein, total  7.5  11/24/2017 11:15 AM       Albumin  3.7  11/24/2017 11:15 AM       Globulin  3.8  11/24/2017 11:15 AM       A-G Ratio  1.0 (L)  11/24/2017 11:15 AM            ALT (SGPT)  27  11/24/2017 11:15 AM              Orders Placed This Encounter        ?  METABOLIC PANEL, BASIC              Standing Status:    Future         Standing Expiration Date:    10/27/2018        ?  AMB POC EKG ROUTINE W/ 12 LEADS, INTER & REP              Order Specific Question:    Reason for Exam:         Answer:    ROUTINE        ?  indapamide (LOZOL) 2.5 mg tablet             Sig: Take 1 Tab by mouth daily.         Dispense:  37 Tab             Refill:  1              Assessment:       Assessment:                  ICD-10-CM  ICD-9-CM             1.  Aortic valve stenosis, etiology of cardiac valve disease unspecified  I35.0  424.1  AMB POC EKG ROUTINE W/ 12 LEADS, INTER & REP     2.  Essential hypertension  I10  401.9  AMB POC EKG ROUTINE W/ 12 LEADS, INTER & REP                METABOLIC PANEL, BASIC           3.  Mixed hyperlipidemia  E78.2  272.2  AMB POC EKG ROUTINE W/ 12 LEADS, INTER & REP  4.  DOE (dyspnea on exertion)   R06.09  786.09  AMB POC EKG ROUTINE W/ 12 LEADS, INTER & REP              Plan:        1. Aortic valve stenosis, etiology of cardiac valve disease unspecified   Echo done 03/2018 with preserved LVEF 60-65% with moderate AS, Vmax 3.9 ms/ and mean PG 35, peak PG 63 mmHg   TEE done on 04/06/18 confirmed moderate aortic stenosis, with peak PG 39 mmHg, mean PG 25 mmHg, and Vmax 3.1 m/s   Will continue with present medical management.      2. Essential hypertension   BP sub-optimal; home readings 130-150s/80s   Increase Indapamide 2.5 mg daily; continue telmisartan   F/u in 1 month for BP check with BMP      3. Mixed hyperlipidemia   Continue statin therapy; encouraged low fat, low cholesterol diet   Lipids managed by PCP      4.  DOE   Scheduled for sleep study at the end of July      May proceed with foot/ankle surgery (Dr. Shearon Balo) as anticipated, considered low operative risk from CV standpoint.      F/u for blood pressure check for repeat echocardiogram to reassess aortic valve in 1 year, follow-up with me thereafter.      Scarlett Presto, MD

## 2018-04-26 NOTE — Progress Notes (Signed)
1. Have you been to the ER, urgent care clinic since your last visit?  Hospitalized since your last visit? YES, HOSPITAL FOLLOW UP.     2. Have you seen or consulted any other health care providers outside of the [facility] since your last visit?  Include any pap smears or colon screening. NO    C/O SWELLING IN BLE, SOB.

## 2018-04-26 NOTE — Progress Notes (Signed)
1. Have you been to the ER, urgent care clinic since your last visit?  Hospitalized since your last visit? YES, HOSPITAL FOLLOW UP.     2. Have you seen or consulted any other health care providers outside of the [facility] since your last visit?  Include any pap smears or colon screening. NO    C/O SWELLING IN BLE, SOB.

## 2018-04-26 NOTE — Progress Notes (Signed)
Shelby McGuiness, FNP-BC    Subjective/HPI:     [name] is a 67 y.o. female is here for hospital f/u from TEE. She has a PMHx of aortic stenosis, HTN, HLD and obesity.    At last visit, she underwent TEE to evaluate for severity of aortic stenosis, given ongoing symptoms of shortness of breath with exertion.  TEE suggested moderate aortic stenosis, with preserved ejection fraction.    She continues to remain short of breath with exertion.  She is scheduled for sleep study but does not believe she has sleep apnea.  She reports BP at home in the range of 130-150s/80s.    TEE (04/06/18):  ?? Left Ventricle: Estimated left ventricular ejection fraction is 56 - 60%.  ?? Left Atrium: Dilated left atrium.  ?? Aortic Valve: Moderate aortic valve focal thickening present. Moderate aortic valve leaflet calcification present with reduced excursion. Aortic valve mean gradient is 25 mmHg. Moderate aortic valve stenosis is present. Mild aortic valve regurgitation is present.  ?? AVA VTI 1.3 cm2. AVA by planimetry 1.5 cm2.  ?? Mitral Valve: Mild mitral valve regurgitation.           PCP Provider  Shelby Males, MD    Patient Active Problem List   Diagnosis Code   ??? Obesity, morbid ([place]) E66.01     Past Surgical History:   Procedure Laterality Date   ??? HX HYSTERECTOMY     ??? HX ORTHOPAEDIC      right rotator cuff repair   ??? HX ORTHOPAEDIC      l4-l5 spinal fusion   ??? HX ORTHOPAEDIC  08/23/12    RIGHT L4 AND L5 DECOMPRESSION   ??? HX OTHER SURGICAL      cystectomy right shoulder blade   ??? HX OTHER SURGICAL      wisdom teeth extraction   ??? HX TUBAL LIGATION       Family History   Problem Relation Age of Onset   ??? Lung Disease Mother    ??? Hypertension Mother    ??? Heart Disease Father      Social History     Socioeconomic History   ??? Marital status: MARRIED     Spouse name: Not on file   ??? Number of children: Not on file   ??? Years of education: Not on file   ??? Highest education level: Not on file   Occupational History    ??? Not on file   Social Needs   ??? Financial resource strain: Not on file   ??? Food insecurity:     Worry: Not on file     Inability: Not on file   ??? Transportation needs:     Medical: Not on file     Non-medical: Not on file   Tobacco Use   ??? Smoking status: Former Smoker     Packs/day: 0.50     Years: 5.00     Pack years: 2.50     Last attempt to quit: 08/19/1975     Years since quitting: 42.7   ??? Smokeless tobacco: Never Used   Substance and Sexual Activity   ??? Alcohol use: Yes     Comment: occassionally   ??? Drug use: No   ??? Sexual activity: Not on file   Lifestyle   ??? Physical activity:     Days per week: Not on file     Minutes per session: Not on file   ??? Stress: Not on file   Relationships   ???  Social connections:     Talks on phone: Not on file     Gets together: Not on file     Attends religious service: Not on file     Active member of club or organization: Not on file     Attends meetings of clubs or organizations: Not on file     Relationship status: Not on file   ??? Intimate partner violence:     Fear of current or ex partner: Not on file     Emotionally abused: Not on file     Physically abused: Not on file     Forced sexual activity: Not on file   Other Topics Concern   ??? Not on file   Social History Narrative   ??? Not on file       Allergies   Allergen Reactions   ??? Adhesive Rash   ??? Dobutamine Other (comments)     Pt had a paradoxical reaction during a stress test and dropped her heart rate and b/p.  See stress test notes in cc.   ??? Sulfa (Sulfonamide Antibiotics) Rash        Current Outpatient Medications   Medication Sig   ??? indapamide (LOZOL) 2.5 mg tablet Take 1 Tab by mouth daily.   ??? cyclobenzaprine (FLEXERIL) 10 mg tablet Take  by mouth nightly as needed for Muscle Spasm(s).   ??? acetaminophen (TYLENOL) 325 mg tablet Take  by mouth every four (4) hours as needed for Pain.   ??? telmisartan (MICARDIS) 80 mg tablet TAKE 1 TABLET BY MOUTH EVERY DAY   ??? OTHER daily. Tumeric with ginger     ??? APPLE CIDER VINEGAR PO Take  by mouth daily.   ??? rosuvastatin (CRESTOR) 5 mg tablet Take 5 mg by mouth daily.   ??? traMADol (ULTRAM) 50 mg tablet Take 50 mg by mouth two (2) times daily as needed.   ??? desvenlafaxine succinate (PRISTIQ) 50 mg ER tablet Take 50 mg by mouth daily.   ??? amLODIPine (NORVASC) 10 mg tablet Take 10 mg by mouth daily.   ??? DULoxetine (CYMBALTA) 60 mg capsule Take 60 mg by mouth daily.   ??? omeprazole (PRILOSEC) 40 mg capsule Take 40 mg by mouth daily.     No current facility-administered medications for this visit.         I have reviewed the problem list, allergy list, medical history, family, social history and medications.    Review of Symptoms:    Review of Systems   Constitutional: Negative for chills, fever and weight loss.   HENT: Negative for nosebleeds.    Eyes: Negative for blurred vision and double vision.   Respiratory: Positive for shortness of breath. Negative for cough and wheezing.    Cardiovascular: Negative for chest pain, palpitations, orthopnea, leg swelling and PND.   Gastrointestinal: Negative for abdominal pain, blood in stool, diarrhea, nausea and vomiting.   Musculoskeletal: Negative for joint pain.   Skin: Negative for rash.   Neurological: Negative for dizziness, tingling and loss of consciousness.   Endo/Heme/Allergies: Does not bruise/bleed easily.       Physical Exam:      General: Well developed, in no acute distress, cooperative and alert  HEENT: No carotid bruits, no JVD, trach is midline. Neck Supple, PEERL, EOM intact.  Heart:  reg rate and rhythm; normal S1/S2; 2/6 crescendo-decrescendo murmur radiating up to the carotids, gallops or rubs.   Respiratory: Clear bilaterally x 4, no wheezing or rales  Abdomen:   Soft, non-tender, no distention, no masses. + BS.   Extremities:  Normal cap refill, no cyanosis, atraumatic.  No edema.  Neuro: A&Ox3, speech clear, gait stable.   Skin: Skin color is normal. No rashes or lesions. Non diaphoretic   Vascular: 2+ pulses symmetric in all extremities    Vitals:    04/26/18 1339 04/26/18 1346   BP: 130/80 128/80   Pulse: 86    Resp: 16    SpO2: 96%    Weight: 231 lb 12.8 oz (105.1 kg)    Height: '5\' 3"'$  (1.6 m)        Cardiographics    ECG: sinus rhythm; PVC  Results for orders placed or performed during the hospital encounter of 11/24/17   EKG, 12 LEAD, INITIAL   Result Value Ref Range    Ventricular Rate 81 BPM    Atrial Rate 81 BPM    P-R Interval 154 ms    QRS Duration 92 ms    Q-T Interval 400 ms    QTC Calculation (Bezet) 464 ms    Calculated P Axis 40 degrees    Calculated T Axis 113 degrees    Diagnosis       Normal sinus rhythm  T wave abnormality, consider lateral ischemia  When compared with ECG of 28-Jun-2012 10:15,  T wave inversion now evident in Lateral leads  Confirmed by Olegario Shearer [phone]) on 11/25/2017 11:55:40 AM         Cardiology Labs:  No results found for: CHOL, CHOLX, CHLST, CHOLV, 884269, HDL, LDL, LDLC, DLDLP, TGLX, TRIGL, TRIGP, CHHD, CHHDX    Lab Results   Component Value Date/Time    Sodium 140 11/24/2017 11:15 AM    Potassium 2.9 (L) 11/24/2017 11:15 AM    Chloride 103 11/24/2017 11:15 AM    CO2 28 11/24/2017 11:15 AM    Anion gap 9 11/24/2017 11:15 AM    Glucose 136 (H) 11/24/2017 11:15 AM    BUN 12 11/24/2017 11:15 AM    Creatinine 0.75 11/24/2017 11:15 AM    BUN/Creatinine ratio 16 11/24/2017 11:15 AM    GFR est AA >60 11/24/2017 11:15 AM    GFR est non-AA >60 11/24/2017 11:15 AM    Calcium 8.3 (L) 11/24/2017 11:15 AM    Bilirubin, total 0.4 11/24/2017 11:15 AM    AST (SGOT) 14 (L) 11/24/2017 11:15 AM    Alk. phosphatase 83 11/24/2017 11:15 AM    Protein, total 7.5 11/24/2017 11:15 AM    Albumin 3.7 11/24/2017 11:15 AM    Globulin 3.8 11/24/2017 11:15 AM    A-G Ratio 1.0 (L) 11/24/2017 11:15 AM    ALT (SGPT) 27 11/24/2017 11:15 AM        Orders Placed This Encounter   ??? METABOLIC PANEL, BASIC     Standing Status:   Future     Standing Expiration Date:   10/27/2018    ??? AMB POC EKG ROUTINE W/ 12 LEADS, INTER & REP     Order Specific Question:   Reason for Exam:     Answer:   ROUTINE   ??? indapamide (LOZOL) 2.5 mg tablet     Sig: Take 1 Tab by mouth daily.     Dispense:  90 Tab     Refill:  1        Assessment:     Assessment:       ICD-10-CM ICD-9-CM    1. Aortic valve stenosis, etiology of cardiac valve disease  unspecified I35.0 424.1 AMB POC EKG ROUTINE W/ 12 LEADS, INTER & REP   2. Essential hypertension I10 401.9 AMB POC EKG ROUTINE W/ 12 LEADS, INTER & REP      METABOLIC PANEL, BASIC   3. Mixed hyperlipidemia E78.2 272.2 AMB POC EKG ROUTINE W/ 12 LEADS, INTER & REP   4. DOE (dyspnea on exertion) R06.09 786.09 AMB POC EKG ROUTINE W/ 12 LEADS, INTER & REP        Plan:     1. Aortic valve stenosis, etiology of cardiac valve disease unspecified  Echo done 03/2018 with preserved LVEF 60-65% with moderate AS, Vmax 3.9 ms/ and mean PG 35, peak PG 63 mmHg  TEE done on 04/06/18 confirmed moderate aortic stenosis, with peak PG 39 mmHg, mean PG 25 mmHg, and Vmax 3.1 m/s  Will continue with present medical management.    2. Essential hypertension  BP sub-optimal; home readings 130-150s/80s  Increase Indapamide 2.5 mg daily; continue telmisartan  F/u in 1 month for BP check with BMP    3. Mixed hyperlipidemia  Continue statin therapy; encouraged low fat, low cholesterol diet  Lipids managed by PCP    4.  DOE  Scheduled for sleep study at the end of July    May proceed with foot/ankle surgery (Dr. Shearon Balo) as anticipated, considered low operative risk from CV standpoint.    F/u for blood pressure check for repeat echocardiogram to reassess aortic valve in 1 year, follow-up with me thereafter.    Scarlett Presto, MD

## 2018-05-24 ENCOUNTER — Encounter: Attending: Nurse Practitioner | Primary: Internal Medicine

## 2018-05-25 LAB — AMB EXT CREATININE
Creatinine, External: 0.77
Creatinine, External: 0.77 NA

## 2018-05-25 LAB — AMB EXT LDL-C
LDL-C, External: 102.6
LDL-C, External: 102.6 NA

## 2018-05-27 ENCOUNTER — Ambulatory Visit: Admit: 2018-05-27 | Discharge: 2018-05-27 | Payer: MEDICARE | Attending: Nurse Practitioner | Primary: Internal Medicine

## 2018-05-27 ENCOUNTER — Encounter

## 2018-05-27 ENCOUNTER — Ambulatory Visit: Attending: Nurse Practitioner | Primary: Internal Medicine

## 2018-05-27 DIAGNOSIS — I350 Nonrheumatic aortic (valve) stenosis: Secondary | ICD-10-CM

## 2018-05-27 NOTE — Progress Notes (Signed)
Office note with clearance and EKG routed electronically to Dr. Redge Gainer.

## 2018-05-27 NOTE — Progress Notes (Signed)
EKG and recent office notes with clearance for procedure faxed to Dr. Murrell ConverseVanmanen[facility] at [phone] with confirmation received.

## 2018-05-27 NOTE — Progress Notes (Signed)
 1. Have you been to the ER, urgent care clinic since your last visit?  Hospitalized since your last visit?No    2. Have you seen or consulted any other health care providers outside of the [facility] since your last visit?  Include any pap smears or colon screening. No     Surgery scheduled for 06/02/18.    Chief Complaint   Patient presents with   . Surgical Clearance     for left posterior tibial tendon reconstruction - Dr. Verona with [facility]

## 2018-05-27 NOTE — Progress Notes (Signed)
Progress  Notes by Garret Reddish, NP at 05/27/18 0930                Author: Garret Reddish, NP  Service: --  Author Type: Nurse Practitioner       Filed: 05/27/18 1020  Encounter Date: 05/27/2018  Status: Signed          Editor: Garret Reddish, NP (Nurse Practitioner)                            Garret Reddish DNP, ANP-BC   Subjective/HPI:       [name] is a 67 y.o.  female is here for cardiac clearance regarding left posterior tibial tendon reconstruction.  Patient reports feeling well no dyspnea on exertion  no chest pain.  Activity has been limited due to knee pain.  Recent echocardiogram transthoracic shows normal systolic function, functioning aortic valve with moderate stenosis mean gradient of 25 mmHg.  Nuclear stress test March 2019- for ischemia.   Recent increase in indapamide has normalized blood pressure.      TEE:   ??  Left Ventricle: Estimated left ventricular ejection fraction is 56 - 60%.   ??  Left Atrium: Dilated left atrium.   ??  Aortic Valve: Moderate aortic valve focal thickening present. Moderate aortic valve leaflet calcification present with reduced excursion. Aortic valve mean gradient is 25 mmHg. Moderate aortic valve stenosis is present. Mild aortic valve regurgitation  is present.   ??  AVA VTI 1.3 cm2. AVA by planimetry 1.5 cm2.   ??  Mitral Valve: Mild mitral valve regurgitation.      12/2017 NST:   ??  Baseline ECG: Sinus rhythm.   ??  Gated SPECT: Left ventricular function post-stress was normal. Calculated ejection fraction is 69%. There is no evidence of transient ischemic dilation (TID).   ??  Left ventricular perfusion is probably normal.   ??  Myocardial perfusion imaging defect 1: There is a defect that is small in size present in the basal inferior location(s) that is reversible. There is normal wall motion in the defect area. The defect appears to probably be artifact caused by motion. Perfusion  defect was visually present without quantitative evidence.   ??  Myocardial perfusion  imaging supports a low risk stress test.   PCP Provider   [name], MD     Past Medical History:        Diagnosis  Date         ?  Aortic stenosis       ?  Arthritis            osteo arthritis         ?  Asthma       ?  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  GERD (gastroesophageal reflux disease)       ?  High cholesterol       ?  Hypertension       ?  Nausea & vomiting       ?  Other and unspecified symptoms and signs involving general sensations and perceptions            fibromyalgia/osteo arthritis         ?  Other ill-defined conditions(799.89)            heart mumur         ?  Thyroid disease            goiter/no longer on medication           Past Surgical History:         Procedure  Laterality  Date          ?  HX HYSTERECTOMY         ?  HX ORTHOPAEDIC              right rotator cuff repair          ?  HX ORTHOPAEDIC              l4-l5 spinal fusion          ?  HX ORTHOPAEDIC    08/23/12          RIGHT L4 AND L5 DECOMPRESSION          ?  HX OTHER SURGICAL              cystectomy right shoulder blade          ?  HX OTHER SURGICAL              wisdom teeth extraction          ?  HX TUBAL LIGATION              Allergies        Allergen  Reactions         ?  Adhesive  Rash     ?  Dobutamine  Other (comments)             Pt had a paradoxical reaction during a stress test and dropped her heart rate and b/p.  See stress test notes in cc.         ?  Sulfa (Sulfonamide Antibiotics)  Rash           Family History         Problem  Relation  Age of Onset          ?  Lung Disease  Mother       ?  Hypertension  Mother            ?  Heart Disease  Father             Current Outpatient Medications        Medication  Sig         ?  indapamide (LOZOL) 2.5 mg tablet  Take 1 Tab by mouth daily.     ?  cyclobenzaprine (FLEXERIL) 10 mg tablet  Take  by mouth nightly as needed for Muscle Spasm(s).     ?  acetaminophen (TYLENOL) 325 mg tablet  Take  by mouth every four (4) hours as needed  for Pain.     ?  telmisartan (MICARDIS) 80 mg tablet  TAKE 1 TABLET BY MOUTH EVERY DAY     ?  OTHER  daily. Tumeric with ginger      ?  APPLE CIDER VINEGAR PO  Take  by mouth daily.     ?  rosuvastatin (CRESTOR) 5 mg tablet  Take 5 mg by mouth daily.     ?  traMADol (ULTRAM) 50 mg tablet  Take 50 mg by mouth two (2) times daily as needed.     ?  desvenlafaxine succinate (PRISTIQ) 50 mg ER tablet  Take 50 mg by mouth daily.     ?  amLODIPine (NORVASC) 10 mg  tablet  Take 10 mg by mouth daily.         ?  DULoxetine (CYMBALTA) 60 mg capsule  Take 60 mg by mouth daily.         ?  omeprazole (PRILOSEC) 40 mg capsule  Take 40 mg by mouth daily.          No current facility-administered medications for this visit.          There were no vitals filed for this visit.     Social History          Socioeconomic History         ?  Marital status:  MARRIED              Spouse name:  Not on file         ?  Number of children:  Not on file     ?  Years of education:  Not on file     ?  Highest education level:  Not on file       Occupational History        ?  Not on file       Social Needs         ?  Financial resource strain:  Not on file        ?  Food insecurity:              Worry:  Not on file         Inability:  Not on file        ?  Transportation needs:              Medical:  Not on file         Non-medical:  Not on file       Tobacco Use         ?  Smoking status:  Former Smoker              Packs/day:  0.50         Years:  5.00         Pack years:  2.50         Last attempt to quit:  08/19/1975         Years since quitting:  42.8         ?  Smokeless tobacco:  Never Used       Substance and Sexual Activity         ?  Alcohol use:  Yes             Comment: occassionally         ?  Drug use:  No     ?  Sexual activity:  Not on file       Lifestyle        ?  Physical activity:              Days per week:  Not on file         Minutes per session:  Not on file         ?  Stress:  Not on file       Relationships        ?  Social  connections:              Talks on phone:  Not on file         Gets together:  Not on file  Attends religious service:  Not on file         Active member of club or organization:  Not on file         Attends meetings of clubs or organizations:  Not on file         Relationship status:  Not on file        ?  Intimate partner violence:              Fear of current or ex partner:  Not on file         Emotionally abused:  Not on file         Physically abused:  Not on file         Forced sexual activity:  Not on file        Other Topics  Concern        ?  Not on file       Social History Narrative        ?  Not on file           I have reviewed the nurses notes, vitals, problem list, allergy list, medical history, family, social history and medications.      Review of Symptoms:      General: Pt denies excessive weight gain or loss. Pt is able to conduct ADL's   HEENT: Denies blurred vision, headaches, epistaxis and difficulty swallowing.   Respiratory: Denies shortness of breath, DOE, wheezing or stridor.   Cardiovascular: Denies precordial pain, palpitations, edema or PND   Gastrointestinal: Denies poor appetite, indigestion, abdominal pain or blood in stool   Musculoskeletal: + Left knee pain   Neurologic: Denies tremor, paresthesias, or sensory motor disturbance   Skin: Denies rash, itching or texture change.         Physical Exam: ??      General: Well developed, in no acute distress, cooperative and alert   HEENT: No carotid bruits, no JVD, trach is midline. Neck Supple, PEERL, EOM intact.   Heart: ??Normal S1/S2 negative S3 or S4. Regular, 2/6 systolic murmur consistent with aortic stenosis, no gallop or rub.??   Respiratory: Clear bilaterally x 4, no wheezing or rales   Abdomen:?? ??Soft, non-tender, no masses, bowel sounds are active.??   Extremities:  No edema, normal cap refill, no cyanosis, atraumatic.    Neuro: A&Ox3, speech clear, gait stable.    Skin: Skin color is normal. No rashes or lesions. Non  diaphoretic   Vascular: 2+ pulses symmetric in all extremities      Cardiographics      ECG: Normal sinus rhythm     Results for orders placed or performed during the hospital encounter of 11/24/17     EKG, 12 LEAD, INITIAL         Result  Value  Ref Range            Ventricular Rate  81  BPM       Atrial Rate  81  BPM       P-R Interval  154  ms       QRS Duration  92  ms       Q-T Interval  400  ms       QTC Calculation (Bezet)  464  ms       Calculated P Axis  40  degrees       Calculated T Axis  113  degrees       Diagnosis  Normal sinus rhythm   T wave abnormality, consider lateral ischemia   When compared with ECG of 28-Jun-2012 10:15,   T wave inversion now evident in Lateral leads   Confirmed by Olegario Shearer [phone]) on 11/25/2017 11:55:40 AM                 Cardiology Labs:   No results found for: CHOL, CHOLX, CHLST, CHOLV, 884269, HDL, LDL, LDLC, DLDLP, TGLX, TRIGL, TRIGP, CHHD, CHHDX        Lab Results         Component  Value  Date/Time            Sodium  140  11/24/2017 11:15 AM       Potassium  2.9 (L)  11/24/2017 11:15 AM       Chloride  103  11/24/2017 11:15 AM       CO2  28  11/24/2017 11:15 AM       Anion gap  9  11/24/2017 11:15 AM       Glucose  136 (H)  11/24/2017 11:15 AM       BUN  12  11/24/2017 11:15 AM       Creatinine  0.75  11/24/2017 11:15 AM       BUN/Creatinine ratio  16  11/24/2017 11:15 AM       GFR est AA  >60  11/24/2017 11:15 AM       GFR est non-AA  >60  11/24/2017 11:15 AM       Calcium  8.3 (L)  11/24/2017 11:15 AM       Bilirubin, total  0.4  11/24/2017 11:15 AM       AST (SGOT)  14 (L)  11/24/2017 11:15 AM       Alk. phosphatase  83  11/24/2017 11:15 AM       Protein, total  7.5  11/24/2017 11:15 AM       Albumin  3.7  11/24/2017 11:15 AM       Globulin  3.8  11/24/2017 11:15 AM       A-G Ratio  1.0 (L)  11/24/2017 11:15 AM            ALT (SGPT)  27  11/24/2017 11:15 AM                  Assessment:       Assessment:        Diagnoses and all orders for this  visit:      1. Nonrheumatic aortic valve stenosis      2. Essential hypertension      3. High cholesterol                   ICD-10-CM  ICD-9-CM             1.  Nonrheumatic aortic valve stenosis  I35.0  424.1       2.  Essential hypertension  I10  401.9             3.  High cholesterol  E78.00  272.0          No orders of the defined types were placed in this encounter.            Plan:        1.  Aortic stenosis: Moderate, asymptomatic stable via recent transesophageal echocardiogram.   2.  Hypertension: Controlled 134/72 continue current medications      Patient is stable from  cardiac perspective is cleared for orthopedic surgery.      Garret Reddish, NP      This note was created using voice recognition software. Despite editing, there may be syntax errors.

## 2018-05-27 NOTE — Progress Notes (Signed)
1. Have you been to the ER, urgent care clinic since your last visit?  Hospitalized since your last visit?No    2. Have you seen or consulted any other health care providers outside of the [facility] since your last visit?  Include any pap smears or colon screening. No     Surgery scheduled for 06/02/18.    Chief Complaint   Patient presents with   ??? Surgical Clearance     for left posterior tibial tendon reconstruction - Dr. Vanmanen with [facility]

## 2018-05-27 NOTE — Progress Notes (Signed)
Office note with clearance and EKG routed electronically to Dr. Vanmanen.

## 2018-05-27 NOTE — Progress Notes (Signed)
Garret Reddish DNP, ANP-BC  Subjective/HPI:     [name] is a 67 y.o. female is here for cardiac clearance regarding left posterior tibial tendon reconstruction.  Patient reports feeling well no dyspnea on exertion no chest pain.  Activity has been limited due to knee pain.  Recent echocardiogram transthoracic shows normal systolic function, functioning aortic valve with moderate stenosis mean gradient of 25 mmHg.  Nuclear stress test March 2019- for ischemia.  Recent increase in indapamide has normalized blood pressure.    TEE:  ?? Left Ventricle: Estimated left ventricular ejection fraction is 56 - 60%.  ?? Left Atrium: Dilated left atrium.  ?? Aortic Valve: Moderate aortic valve focal thickening present. Moderate aortic valve leaflet calcification present with reduced excursion. Aortic valve mean gradient is 25 mmHg. Moderate aortic valve stenosis is present. Mild aortic valve regurgitation is present.  ?? AVA VTI 1.3 cm2. AVA by planimetry 1.5 cm2.  ?? Mitral Valve: Mild mitral valve regurgitation.    12/2017 NST:  ?? Baseline ECG: Sinus rhythm.  ?? Gated SPECT: Left ventricular function post-stress was normal. Calculated ejection fraction is 69%. There is no evidence of transient ischemic dilation (TID).  ?? Left ventricular perfusion is probably normal.  ?? Myocardial perfusion imaging defect 1: There is a defect that is small in size present in the basal inferior location(s) that is reversible. There is normal wall motion in the defect area. The defect appears to probably be artifact caused by motion. Perfusion defect was visually present without quantitative evidence.  ?? Myocardial perfusion imaging supports a low risk stress test.  PCP Provider  [name], MD  Past Medical History:   Diagnosis Date   ??? Aortic stenosis    ??? Arthritis     osteo arthritis   ??? Asthma    ??? 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 GERD (gastroesophageal reflux disease)    ??? High cholesterol     ??? Hypertension    ??? Nausea & vomiting    ??? Other and unspecified symptoms and signs involving general sensations and perceptions     fibromyalgia/osteo arthritis   ??? Other ill-defined conditions(799.89)     heart mumur   ??? Thyroid disease     goiter/no longer on medication      Past Surgical History:   Procedure Laterality Date   ??? HX HYSTERECTOMY     ??? HX ORTHOPAEDIC      right rotator cuff repair   ??? HX ORTHOPAEDIC      l4-l5 spinal fusion   ??? HX ORTHOPAEDIC  08/23/12    RIGHT L4 AND L5 DECOMPRESSION   ??? HX OTHER SURGICAL      cystectomy right shoulder blade   ??? HX OTHER SURGICAL      wisdom teeth extraction   ??? HX TUBAL LIGATION       Allergies   Allergen Reactions   ??? Adhesive Rash   ??? Dobutamine Other (comments)     Pt had a paradoxical reaction during a stress test and dropped her heart rate and b/p.  See stress test notes in cc.   ??? Sulfa (Sulfonamide Antibiotics) Rash      Family History   Problem Relation Age of Onset   ??? Lung Disease Mother    ??? Hypertension Mother    ??? Heart Disease Father       Current Outpatient Medications   Medication Sig   ??? indapamide (LOZOL) 2.5 mg  tablet Take 1 Tab by mouth daily.   ??? cyclobenzaprine (FLEXERIL) 10 mg tablet Take  by mouth nightly as needed for Muscle Spasm(s).   ??? acetaminophen (TYLENOL) 325 mg tablet Take  by mouth every four (4) hours as needed for Pain.   ??? telmisartan (MICARDIS) 80 mg tablet TAKE 1 TABLET BY MOUTH EVERY DAY   ??? OTHER daily. Tumeric with ginger    ??? APPLE CIDER VINEGAR PO Take  by mouth daily.   ??? rosuvastatin (CRESTOR) 5 mg tablet Take 5 mg by mouth daily.   ??? traMADol (ULTRAM) 50 mg tablet Take 50 mg by mouth two (2) times daily as needed.   ??? desvenlafaxine succinate (PRISTIQ) 50 mg ER tablet Take 50 mg by mouth daily.   ??? amLODIPine (NORVASC) 10 mg tablet Take 10 mg by mouth daily.   ??? DULoxetine (CYMBALTA) 60 mg capsule Take 60 mg by mouth daily.   ??? omeprazole (PRILOSEC) 40 mg capsule Take 40 mg by mouth daily.      No current facility-administered medications for this visit.       There were no vitals filed for this visit.  Social History     Socioeconomic History   ??? Marital status: MARRIED     Spouse name: Not on file   ??? Number of children: Not on file   ??? Years of education: Not on file   ??? Highest education level: Not on file   Occupational History   ??? Not on file   Social Needs   ??? Financial resource strain: Not on file   ??? Food insecurity:     Worry: Not on file     Inability: Not on file   ??? Transportation needs:     Medical: Not on file     Non-medical: Not on file   Tobacco Use   ??? Smoking status: Former Smoker     Packs/day: 0.50     Years: 5.00     Pack years: 2.50     Last attempt to quit: 08/19/1975     Years since quitting: 42.8   ??? Smokeless tobacco: Never Used   Substance and Sexual Activity   ??? Alcohol use: Yes     Comment: occassionally   ??? Drug use: No   ??? Sexual activity: Not on file   Lifestyle   ??? Physical activity:     Days per week: Not on file     Minutes per session: Not on file   ??? Stress: Not on file   Relationships   ??? Social connections:     Talks on phone: Not on file     Gets together: Not on file     Attends religious service: Not on file     Active member of club or organization: Not on file     Attends meetings of clubs or organizations: Not on file     Relationship status: Not on file   ??? Intimate partner violence:     Fear of current or ex partner: Not on file     Emotionally abused: Not on file     Physically abused: Not on file     Forced sexual activity: Not on file   Other Topics Concern   ??? Not on file   Social History Narrative   ??? Not on file       I have reviewed the nurses notes, vitals, problem list, allergy list, medical history, family, social history and medications.  Review of Symptoms:    General: Pt denies excessive weight gain or loss. Pt is able to conduct ADL's  HEENT: Denies blurred vision, headaches, epistaxis and difficulty swallowing.   Respiratory: Denies shortness of breath, DOE, wheezing or stridor.  Cardiovascular: Denies precordial pain, palpitations, edema or PND  Gastrointestinal: Denies poor appetite, indigestion, abdominal pain or blood in stool  Musculoskeletal: + Left knee pain  Neurologic: Denies tremor, paresthesias, or sensory motor disturbance  Skin: Denies rash, itching or texture change.      Physical Exam: ??    General: Well developed, in no acute distress, cooperative and alert  HEENT: No carotid bruits, no JVD, trach is midline. Neck Supple, PEERL, EOM intact.  Heart: ??Normal S1/S2 negative S3 or S4. Regular, 2/6 systolic murmur consistent with aortic stenosis, no gallop or rub.??  Respiratory: Clear bilaterally x 4, no wheezing or rales  Abdomen:?? ??Soft, non-tender, no masses, bowel sounds are active.??  Extremities:  No edema, normal cap refill, no cyanosis, atraumatic.   Neuro: A&Ox3, speech clear, gait stable.   Skin: Skin color is normal. No rashes or lesions. Non diaphoretic  Vascular: 2+ pulses symmetric in all extremities    Cardiographics    ECG: Normal sinus rhythm  Results for orders placed or performed during the hospital encounter of 11/24/17   EKG, 12 LEAD, INITIAL   Result Value Ref Range    Ventricular Rate 81 BPM    Atrial Rate 81 BPM    P-R Interval 154 ms    QRS Duration 92 ms    Q-T Interval 400 ms    QTC Calculation (Bezet) 464 ms    Calculated P Axis 40 degrees    Calculated T Axis 113 degrees    Diagnosis       Normal sinus rhythm  T wave abnormality, consider lateral ischemia  When compared with ECG of 28-Jun-2012 10:15,  T wave inversion now evident in Lateral leads  Confirmed by Olegario Shearer [phone]) on 11/25/2017 11:55:40 AM           Cardiology Labs:  No results found for: CHOL, CHOLX, CHLST, CHOLV, 884269, HDL, LDL, LDLC, DLDLP, TGLX, TRIGL, TRIGP, CHHD, CHHDX    Lab Results   Component Value Date/Time    Sodium 140 11/24/2017 11:15 AM    Potassium 2.9 (L) 11/24/2017 11:15 AM     Chloride 103 11/24/2017 11:15 AM    CO2 28 11/24/2017 11:15 AM    Anion gap 9 11/24/2017 11:15 AM    Glucose 136 (H) 11/24/2017 11:15 AM    BUN 12 11/24/2017 11:15 AM    Creatinine 0.75 11/24/2017 11:15 AM    BUN/Creatinine ratio 16 11/24/2017 11:15 AM    GFR est AA >60 11/24/2017 11:15 AM    GFR est non-AA >60 11/24/2017 11:15 AM    Calcium 8.3 (L) 11/24/2017 11:15 AM    Bilirubin, total 0.4 11/24/2017 11:15 AM    AST (SGOT) 14 (L) 11/24/2017 11:15 AM    Alk. phosphatase 83 11/24/2017 11:15 AM    Protein, total 7.5 11/24/2017 11:15 AM    Albumin 3.7 11/24/2017 11:15 AM    Globulin 3.8 11/24/2017 11:15 AM    A-G Ratio 1.0 (L) 11/24/2017 11:15 AM    ALT (SGPT) 27 11/24/2017 11:15 AM           Assessment:     Assessment:     Diagnoses and all orders for this visit:    1. Nonrheumatic aortic valve stenosis  2. Essential hypertension    3. High cholesterol        ICD-10-CM ICD-9-CM    1. Nonrheumatic aortic valve stenosis I35.0 424.1    2. Essential hypertension I10 401.9    3. High cholesterol E78.00 272.0      No orders of the defined types were placed in this encounter.       Plan:     1.  Aortic stenosis: Moderate, asymptomatic stable via recent transesophageal echocardiogram.  2.  Hypertension: Controlled 134/72 continue current medications    Patient is stable from cardiac perspective is cleared for orthopedic surgery.    Garret Reddish, NP    This note was created using voice recognition software. Despite editing, there may be syntax errors.

## 2018-05-27 NOTE — Progress Notes (Signed)
EKG and recent office notes with clearance for procedure faxed to Dr. Vanmanen/[facility] at [phone] with confirmation received.

## 2018-05-31 ENCOUNTER — Encounter: Attending: Nurse Practitioner | Primary: Internal Medicine

## 2018-05-31 NOTE — Interval H&P Note (Signed)
[facility]                   [address] [place], [place] [address]   MAIN OR                                  [phone]   MAIN PRE OP                          [phone]                                                                                  PRE-ADMISSION TESTING    [phone]   Surgery Date:   06/02/18         Is surgery arrival time given by surgeon?  NO  If "NO", [facility] staff will call you between 3 and 7pm the day before your surgery with your arrival time. (If your surgery is on a Monday, we will call you the Friday before.)    Call [phone] after 7pm Monday-Friday if you did not receive your arrival time.    INSTRUCTIONS BEFORE YOUR SURGERY   When You  Arrive Arrive at the 2nd Floor Admitting Desk on the day of your surgery  Have your insurance card, photo ID, and any copayment (if needed)   Food   and   Drink NO food or drink after midnight the night before surgery    This means NO water, gum, mints, coffee, juice, etc.  No alcohol (beer, wine, liquor) 24 hours before and after surgery   Medications to   TAKE   Morning of Surgery MEDICATIONS TO TAKE THE MORNING OF SURGERY WITH A SIP OF WATER:   . Amlodipine, Prilosec, Pristiq, Cymbalta   Medications  To  STOP      7 days before surgery . Non-Steroidal anti-inflammatory Drugs (NSAID's): for example, Ibuprofen (Advil, Motrin), Naproxen (Aleve)  . Aspirin, if taking for pain   . Herbal supplements, vitamins, and fish oil  . Other:  (Pain medications not listed above, including Tylenol may be taken)   Blood  Thinners . If you take  Aspirin, Plavix, Coumadin, or any blood-thinning or anti-blood clot medicine, talk to the doctor who prescribed the medications for pre-operative instructions.   Bathing Clothing  Jewelry  Valuables     .  If you shower the morning of surgery, please do not apply anything to your skin (lotions, powders, deodorant, or makeup, especially mascara)  . Follow all special bath  instructions (for total joint replacement, spine and bowel surgeries)  . Do not shave or trim anywhere 24 hours before surgery  . Wear your hair loose or down; no pony-tails, buns, or metal hair clips  . Wear loose, comfortable, clean clothes  . Wear glasses instead of contacts  . Leave money, valuables, and jewelry, including body piercings, at home   Going Home - or Spending the Night . SAME-DAY SURGERY: You must have a responsible adult drive you home and stay with you 24 hours  after surgery  . ADMITS: If your doctor is keeping you in the hospital after surgery, leave personal belongings/luggage in your car until you have a hospital room number.    Hospital discharge time is 12 noon  Drivers must be here before 12 noon unless you are told differently   Special Instructions Bring walker and crutches to hospital for training for NWB on surgical leg.     Follow all instructions so your surgery won't be cancelled.  Please, be on time.                    If a situation occurs and you are delayed the day of surgery, call [phone] or [phone].    If your physical condition changes (like a fever, cold, flu, etc.) call your surgeon.    The patient was contacted  via phone. Home medication reviewed and verified during PAT appointment.  The patient verbalizes understanding of all instructions and does not  need reinforcement.

## 2018-05-31 NOTE — Other (Signed)
[facility]                   [address]   [place], [place] [id number]   MAIN OR                                  [phone]   MAIN PRE OP                          [phone]                                                                                  PRE-ADMISSION TESTING    [phone]   Surgery Date:   06/02/18         Is surgery arrival time given by surgeon?  NO  If ???NO???, [facility] staff will call you between 3 and 7pm the day before your surgery with your arrival time. (If your surgery is on a Monday, we will call you the Friday before.)    Call [phone] after 7pm Monday-Friday if you did not receive your arrival time.    INSTRUCTIONS BEFORE YOUR SURGERY   When You  Arrive Arrive at the 2nd Floor Admitting Desk on the day of your surgery  Have your insurance card, photo ID, and any copayment (if needed)   Food   and   Drink NO food or drink after midnight the night before surgery    This means NO water, gum, mints, coffee, juice, etc.  No alcohol (beer, wine, liquor) 24 hours before and after surgery   Medications to   TAKE   Morning of Surgery MEDICATIONS TO TAKE THE MORNING OF SURGERY WITH A SIP OF WATER:   ? Amlodipine, Prilosec, Pristiq, Cymbalta   Medications  To  STOP      7 days before surgery ? Non-Steroidal anti-inflammatory Drugs (NSAID's): for example, Ibuprofen (Advil, Motrin), Naproxen (Aleve)  ? Aspirin, if taking for pain   ? Herbal supplements, vitamins, and fish oil  ? Other:  (Pain medications not listed above, including Tylenol may be taken)   Blood  Thinners ? If you take  Aspirin, Plavix, Coumadin, or any blood-thinning or anti-blood clot medicine, talk to the doctor who prescribed the medications for pre-operative instructions.   Bathing Clothing  Jewelry  Valuables     ?  If you shower the morning of surgery, please do not apply anything to your skin (lotions, powders, deodorant, or makeup, especially mascara)   ? Follow all special bath instructions (for total joint replacement, spine and bowel surgeries)  ? Do not shave or trim anywhere 24 hours before surgery  ? Wear your hair loose or down; no pony-tails, buns, or metal hair clips  ? Wear loose, comfortable, clean clothes  ? Wear glasses instead of contacts  ? Leave money, valuables, and jewelry, including body piercings, at home   Going Home - or Spending the Night ? SAME-DAY SURGERY: You must have a responsible adult drive you home and stay with you 24 hours  after surgery  ? ADMITS: If your doctor is keeping you in the hospital after surgery, leave personal belongings/luggage in your car until you have a hospital room number.    Hospital discharge time is 12 noon  Drivers must be here before 12 noon unless you are told differently   Special Instructions Bring walker and crutches to hospital for training for NWB on surgical leg.     Follow all instructions so your surgery won???t be cancelled.  Please, be on time.                    If a situation occurs and you are delayed the day of surgery, call [phone] or [phone].    If your physical condition changes (like a fever, cold, flu, etc.) call your surgeon.    The patient was contacted  via phone. Home medication reviewed and verified during PAT appointment.  The patient verbalizes understanding of all instructions and does not  need reinforcement.

## 2018-06-02 ENCOUNTER — Ambulatory Visit: Payer: MEDICARE | Primary: Internal Medicine

## 2018-06-02 ENCOUNTER — Inpatient Hospital Stay: Payer: MEDICARE

## 2018-06-02 LAB — METABOLIC PANEL, BASIC
Anion gap: 8 mmol/L (ref 5–15)
BUN/Creatinine ratio: 28 — ABNORMAL HIGH (ref 12–20)
BUN: 22 MG/DL — ABNORMAL HIGH (ref 6–20)
CO2: 29 mmol/L (ref 21–32)
Calcium: 9.2 MG/DL (ref 8.5–10.1)
Chloride: 103 mmol/L (ref 97–108)
Creatinine: 0.79 MG/DL (ref 0.55–1.02)
GFR est AA: >60 mL/min/{1.73_m2} (ref >60)
GFR est non-AA: >60 mL/min/{1.73_m2} (ref >60)
Glucose: 115 mg/dL — ABNORMAL HIGH (ref 65–100)
Potassium: 3.2 mmol/L — ABNORMAL LOW (ref 3.5–5.1)
Sodium: 140 mmol/L (ref 136–145)

## 2018-06-02 LAB — BASIC METABOLIC PANEL
Anion Gap: 8 mmol/L (ref 5–15)
BUN: 22 MG/DL — ABNORMAL HIGH (ref 6–20)
Bun/Cre Ratio: 28 — ABNORMAL HIGH (ref 12–20)
CO2: 29 mmol/L (ref 21–32)
Calcium: 9.2 MG/DL (ref 8.5–10.1)
Chloride: 103 mmol/L (ref 97–108)
Creatinine: 0.79 MG/DL (ref 0.55–1.02)
EGFR IF NonAfrican American: >60 mL/min/{1.73_m2} (ref >60)
GFR African American: >60 mL/min/{1.73_m2} (ref >60)
Glucose: 115 mg/dL — ABNORMAL HIGH (ref 65–100)
Potassium: 3.2 mmol/L — ABNORMAL LOW (ref 3.5–5.1)
Sodium: 140 mmol/L (ref 136–145)

## 2018-06-02 MED ORDER — PROPOFOL 10 MG/ML IV EMUL
10 mg/mL | INTRAVENOUS | Status: DC | PRN
Start: 2018-06-02 — End: 2018-06-02
  Administered 2018-06-02: 12:00:00 via INTRAVENOUS

## 2018-06-02 MED ORDER — ROPIVACAINE 2 MG/ML INJECTION
2 mg/mL (0. %) | INTRAMUSCULAR | Status: DC | PRN
Start: 2018-06-02 — End: 2018-06-02
  Administered 2018-06-02 (×2): via PERINEURAL

## 2018-06-02 MED ORDER — FENTANYL CITRATE (PF) 50 MCG/ML IJ SOLN
50 mcg/mL | INTRAMUSCULAR | Status: AC
Start: 2018-06-02 — End: ?

## 2018-06-02 MED ORDER — GLYCOPYRROLATE 0.2 MG/ML IJ SOLN
0.2 mg/mL | INTRAMUSCULAR | Status: DC | PRN
Start: 2018-06-02 — End: 2018-06-02
  Administered 2018-06-02: 13:00:00 via INTRAVENOUS

## 2018-06-02 MED ORDER — PROPOFOL 10 MG/ML IV EMUL
10 mg/mL | INTRAVENOUS | Status: AC
Start: 2018-06-02 — End: ?

## 2018-06-02 MED ORDER — ROCURONIUM 10 MG/ML IV
10 mg/mL | INTRAVENOUS | Status: AC
Start: 2018-06-02 — End: ?

## 2018-06-02 MED ORDER — DIPHENHYDRAMINE HCL 50 MG/ML IJ SOLN
50 mg/mL | INTRAMUSCULAR | Status: DC | PRN
Start: 2018-06-02 — End: 2018-06-02

## 2018-06-02 MED ORDER — ONDANSETRON (PF) 4 MG/2 ML INJECTION
4 mg/2 mL | INTRAMUSCULAR | Status: DC | PRN
Start: 2018-06-02 — End: 2018-06-02
  Administered 2018-06-02: 13:00:00 via INTRAVENOUS

## 2018-06-02 MED ORDER — MIDAZOLAM 1 MG/ML IJ SOLN
1 mg/mL | INTRAMUSCULAR | Status: AC
Start: 2018-06-02 — End: ?

## 2018-06-02 MED ORDER — LIDOCAINE (PF) 10 MG/ML (1 %) IJ SOLN
10 mg/mL (1 %) | INTRAMUSCULAR | Status: DC | PRN
Start: 2018-06-02 — End: 2018-06-02

## 2018-06-02 MED ORDER — LACTATED RINGERS IV
INTRAVENOUS | Status: DC
Start: 2018-06-02 — End: 2018-06-02
  Administered 2018-06-02: 11:00:00 via INTRAVENOUS

## 2018-06-02 MED ORDER — FENTANYL CITRATE (PF) 50 MCG/ML IJ SOLN
50 mcg/mL | INTRAMUSCULAR | Status: DC | PRN
Start: 2018-06-02 — End: 2018-06-02
  Administered 2018-06-02 (×2): via INTRAVENOUS

## 2018-06-02 MED ORDER — NEOSTIGMINE METHYLSULFATE 1 MG/ML INJECTION
1 mg/mL | INTRAMUSCULAR | Status: DC | PRN
Start: 2018-06-02 — End: 2018-06-02
  Administered 2018-06-02: 13:00:00 via INTRAVENOUS

## 2018-06-02 MED ORDER — ONDANSETRON (PF) 4 MG/2 ML INJECTION
4 mg/2 mL | INTRAMUSCULAR | Status: AC
Start: 2018-06-02 — End: ?

## 2018-06-02 MED ORDER — HYDROMORPHONE 0.5 MG/0.5 ML SYRINGE
0.5 mg/ mL | INTRAMUSCULAR | Status: DC | PRN
Start: 2018-06-02 — End: 2018-06-02

## 2018-06-02 MED ORDER — MIDAZOLAM 1 MG/ML IJ SOLN
1 mg/mL | INTRAMUSCULAR | Status: DC | PRN
Start: 2018-06-02 — End: 2018-06-02
  Administered 2018-06-02: 11:00:00 via INTRAVENOUS

## 2018-06-02 MED ORDER — LACTATED RINGERS IV
INTRAVENOUS | Status: DC
Start: 2018-06-02 — End: 2018-06-02

## 2018-06-02 MED ORDER — ROCURONIUM 10 MG/ML IV
10 mg/mL | INTRAVENOUS | Status: DC | PRN
Start: 2018-06-02 — End: 2018-06-02
  Administered 2018-06-02 (×2): via INTRAVENOUS

## 2018-06-02 MED ORDER — ALBUTEROL SULFATE 0.083 % (0.83 MG/ML) SOLN FOR INHALATION
2.5 mg /3 mL (0.083 %) | RESPIRATORY_TRACT | Status: DC | PRN
Start: 2018-06-02 — End: 2018-06-02

## 2018-06-02 MED ORDER — OXYCODONE-ACETAMINOPHEN 5 MG-325 MG TAB
5-325 mg | ORAL | Status: AC
Start: 2018-06-02 — End: 2018-06-02
  Administered 2018-06-02: 15:00:00 via ORAL

## 2018-06-02 MED ORDER — DEXAMETHASONE SODIUM PHOSPHATE 4 MG/ML IJ SOLN
4 mg/mL | INTRAMUSCULAR | Status: DC | PRN
Start: 2018-06-02 — End: 2018-06-02
  Administered 2018-06-02: 12:00:00 via INTRAVENOUS

## 2018-06-02 MED ORDER — ONDANSETRON (PF) 4 MG/2 ML INJECTION
4 mg/2 mL | INTRAMUSCULAR | Status: DC | PRN
Start: 2018-06-02 — End: 2018-06-02

## 2018-06-02 MED ORDER — DEXAMETHASONE SODIUM PHOSPHATE 4 MG/ML IJ SOLN
4 mg/mL | INTRAMUSCULAR | Status: AC
Start: 2018-06-02 — End: ?

## 2018-06-02 MED ORDER — MEPERIDINE (PF) 25 MG/ML INJ SOLUTION
25 mg/ml | INTRAMUSCULAR | Status: DC | PRN
Start: 2018-06-02 — End: 2018-06-02

## 2018-06-02 MED ORDER — CEFAZOLIN 2 GRAM/20 ML IN STERILE WATER INTRAVENOUS SYRINGE
2 gram/0 mL | INTRAVENOUS | Status: AC
Start: 2018-06-02 — End: 2018-06-02
  Administered 2018-06-02: 12:00:00 via INTRAVENOUS

## 2018-06-02 MED FILL — DEXAMETHASONE SODIUM PHOSPHATE 4 MG/ML IJ SOLN: 4 mg/mL | INTRAMUSCULAR | Qty: 1

## 2018-06-02 MED FILL — OXYCODONE-ACETAMINOPHEN 5 MG-325 MG TAB: 5-325 mg | ORAL | Qty: 2

## 2018-06-02 MED FILL — CEFAZOLIN 2 GRAM/20 ML IN STERILE WATER INTRAVENOUS SYRINGE: 2 gram/0 mL | INTRAVENOUS | Qty: 20

## 2018-06-02 MED FILL — FENTANYL CITRATE (PF) 50 MCG/ML IJ SOLN: 50 mcg/mL | INTRAMUSCULAR | Qty: 2

## 2018-06-02 MED FILL — DIPRIVAN 10 MG/ML INTRAVENOUS EMULSION: 10 mg/mL | INTRAVENOUS | Qty: 20

## 2018-06-02 MED FILL — ONDANSETRON (PF) 4 MG/2 ML INJECTION: 4 mg/2 mL | INTRAMUSCULAR | Qty: 2

## 2018-06-02 MED FILL — LACTATED RINGERS IV: INTRAVENOUS | Qty: 1000

## 2018-06-02 MED FILL — MIDAZOLAM 1 MG/ML IJ SOLN: 1 mg/mL | INTRAMUSCULAR | Qty: 2

## 2018-06-02 MED FILL — ROCURONIUM 10 MG/ML IV: 10 mg/mL | INTRAVENOUS | Qty: 5

## 2018-06-02 NOTE — Interval H&P Note (Signed)
 Pt's spouse updated on pt's care with plan for discharge with understanding being verbalized.

## 2018-06-02 NOTE — Interval H&P Note (Signed)
Peripheral nerve block pamphlet provided along with discharge instructions.

## 2018-06-02 NOTE — Progress Notes (Signed)
 physical Therapy EVALUATION  (Ambulatory surgery, emergency room & recovery room patients)    Patient: [name] (67 y.o. female)  Date: 06/02/2018  Primary Diagnosis and Medical History: LEFT POSTERIOR TIBIAL TENDON DYSFUNCTION  Procedure(s) (LRB):  LEFT POSTERIOR TIBIAL TENDON RECONSTRUCTION (GENERAL WITH POPLITEAL BLOCK) (Left) Day of Surgery   Past Medical History:   Diagnosis Date   . Aortic stenosis    . Arthritis     osteo arthritis   . Asthma    . Autoimmune disease ([facility])     connective tissue disease   . GERD (gastroesophageal reflux disease)    . High cholesterol    . Hypertension    . Nausea & vomiting    . Other and unspecified symptoms and signs involving general sensations and perceptions     fibromyalgia/osteo arthritis   . Other ill-defined conditions(799.89)     heart mumur   . Thyroid disease     goiter/no longer on medication     Past Surgical History:   Procedure Laterality Date   . HX HYSTERECTOMY     . HX ORTHOPAEDIC      right rotator cuff repair   . HX ORTHOPAEDIC      l4-l5 spinal fusion   . HX ORTHOPAEDIC  08/23/12    RIGHT L4 AND L5 DECOMPRESSION   . HX OTHER SURGICAL      cystectomy right shoulder blade   . HX OTHER SURGICAL      wisdom teeth extraction   . HX TUBAL LIGATION       Patient Active Problem List   Diagnosis Code   . Obesity, morbid ([facility]) E66.01   . Hypertension I10   . High cholesterol E78.00   . Aortic stenosis I35.0     Prior Level of Function/Home Situation: Independent community ambulator without assistive device.  Personal factors and/or comorbidities impacting plan of care:     Home Situation  Home Environment: Private residence  One/Two Story Residence: Two story  Living Alone: No  Support Systems: Family member(s)  Patient Expects to be Discharged to:: Private residence  Ordered Weight Bearing Status:  left non-weight  Equipment: crutches and walker    EXAMINATION/PRESENTATION/DECISION MAKING:   Critical Behavior:  Neurologic State: Alert, Drowsy, Eyes open to  voice           Transfers:  Overall level of assistance required following instruction: modified independence given verbal using axillary crutches.  Ambulation:  Weight bearing status during ambulation:                   Stair Management:           Strength/ROM Limitations:    Pain:  Pain Scale 1: Numeric (0 - 10)  Pain Intensity 1: 0       Education:  Role of P.T. explained to the patient:  [x]   Yes              []    No       Topics addressed: Comments:   [x]                                     Device use and technique    [x]   Transfer technique    [x]                                     Gait training    [x]                                     Stair training        Patient is discharged from physical therapy at this time.    Alberto E Dela Cruz, PT,WCC.   Time Calculation: 27 mins

## 2018-06-02 NOTE — Anesthesia Post-Procedure Evaluation (Signed)
Procedure(s):  LEFT POSTERIOR TIBIAL TENDON RECONSTRUCTION (GENERAL WITH POPLITEAL BLOCK).    regional, general    Anesthesia Post Evaluation      Multimodal analgesia: multimodal analgesia used between 6 hours prior to anesthesia start to PACU discharge  Patient location during evaluation: bedside  Patient participation: complete - patient participated  Level of consciousness: awake  Pain management: adequate  Airway patency: patent  Anesthetic complications: no  Cardiovascular status: acceptable  Respiratory status: acceptable  Hydration status: acceptable        Vitals Value Taken Time   BP 128/56 06/02/2018 10:15 AM   Temp 37 ??C (98.6 ??F) 06/02/2018 10:15 AM   Pulse 84 06/02/2018 10:15 AM   Resp 12 06/02/2018 10:15 AM   SpO2 95 % 06/02/2018 10:15 AM

## 2018-06-02 NOTE — Anesthesia Pre-Procedure Evaluation (Signed)
Relevant Problems   No relevant active problems       Anesthetic History     PONV (does well with IV medications)          Review of Systems / Medical History  Patient summary reviewed and pertinent labs reviewed    Pulmonary        Sleep apnea: No treatment    Asthma        Neuro/Psych         Psychiatric history     Cardiovascular    Hypertension  Valvular problems/murmurs (moderate AS): aortic stenosis        Hyperlipidemia    Exercise tolerance: <4 METS  Comments: EF 69%   GI/Hepatic/Renal     GERD: well controlled           Endo/Other        Morbid obesity and arthritis     Other Findings   Comments: Connective tissue disease  Fibromyalgia           Physical Exam    Airway  Mallampati: III  TM Distance: 4 - 6 cm  Neck ROM: normal range of motion   Mouth opening: Normal     Cardiovascular    Rhythm: regular  Rate: normal         Dental    Dentition: Upper dentition intact and Lower dentition intact     Pulmonary  Breath sounds clear to auscultation               Abdominal         Other Findings            Anesthetic Plan    ASA: 3  Anesthesia type: regional and general - popliteal fossa block      Post-op pain plan if not by surgeon: peripheral nerve block single    Induction: Intravenous  Anesthetic plan and risks discussed with: Patient

## 2018-06-02 NOTE — Interval H&P Note (Signed)
Patient Fall Protocol  Yellow arm band applied to patient and yellow non skid socks placed on  Bed in low position, all side rails up, call bell in reach  Pt and Family instructed in "call- don't fall" protocol   -use your call bell, wait for assistance, staff not family will assist you to get up and move about   Pt and family verbalize understanding of fall precautions and the "call don't fall" Protocol

## 2018-06-02 NOTE — Op Note (Signed)
BRIEF OPERATIVE NOTE    Date of Procedure: 06/02/2018   Preoperative Diagnosis: LEFT POSTERIOR TIBIAL TENDON DYSFUNCTION  Postoperative Diagnosis: LEFT POSTERIOR TIBIAL TENDON DYSFUNCTION    Procedure(s):  LEFT POSTERIOR TIBIAL TENDON RECONSTRUCTION (GENERAL WITH POPLITEAL BLOCK)  Surgeon(s) and Role:     Brien Few* Breyana Follansbee, MD - Primary         Surgical Assistant: 0    Surgical Staff:  Circ-1: Sherrill Raringopeland, Mandy L, RN  Scrub Tech-1: Buck MamJackson, Jasmine  Retractor Holder: Farris Hasaines, Tia D  Float Staff: Willaim Rayasunlap, Patricia, RN  Event Time In Time Out   Incision Start [id number]    Incision Close [phone]      Anesthesia: General   Estimated Blood Loss: 0  Specimens:   ID Type Source Tests Collected by Time Destination   1 : left tibial tendon  Preservative Foot, left  Brien FewVanmanen, Dyanara Cozza, MD 06/02/2018 [id number] Pathology      Findings: 0   Complications: 0  Implants: * No implants in log *

## 2018-06-02 NOTE — Op Note (Signed)
[facility]  OPERATIVE REPORT    Name:  Shelby BushmanCLASS, [name]  MR#:  [id number]  DOB:  [birth year]  ACCOUNT #:  [id number]  DATE OF SERVICE:  06/02/2018    PREOPERATIVE DIAGNOSIS:  Left posterior tibial tendon dysfunction.    POSTOPERATIVE DIAGNOSIS:  Left posterior tibial tendon dysfunction.    PROCEDURE PERFORMED:  Left posterior tibial tendon reconstruction with flexor digitorum longus tendon transfer.    SURGEON:  Yvone NeuJohn Van Manen, MD    ASSISTANT:  None.    ANESTHESIA:  General with popliteal block.    COMPLICATIONS:  None encountered.    SPECIMENS REMOVED:  Posterior tibial tendon.    IMPLANTS:  None.    ESTIMATED BLOOD LOSS:  Nil.    TOURNIQUET TIME:  60 minutes.    PROCEDURE IN DETAIL:  After consents were obtained and preoperative sedation, the patient was taken to the operating suite.  She underwent a popliteal block and then general endotracheal anesthesia without difficulty.  A pneumatic tourniquet was placed around the left thigh and the left lower extremity was prepped and draped in the usual sterile fashion.  After Esmarch exsanguination, the tourniquet was inflated to 350 mmHg.  The incision was mapped out on the medial aspect of the ankle.  The incision was started over the posterior tibial tendon musculotendinous junction, about 8 cm proximal to the distal tip of the medial malleolus and extended distally along the course of the posterior tibial tendon to the navicular tuberosity and then along the medial border of the foot to just proximal to the first metatarsal head.  The incision distally was carried out at the junction between the plantar and dorsal skin.  The incision was carried down through skin and subcutaneous tissue, down to the deep fascia.  A plane was developed anteriorly and posteriorly and venous bleeders were Bovie cauterized.  The deep fascia and retinaculum were divided between the medial malleolus and tuberosity of the navicular.  There was fibrosis of the tendon  against the deep fascia.  There were multiple split tear in the tendon and the tendon was atretic at its insertion.  The tendon was released from its insertion, leaving a stump of the tendon at the tuberosity.  The deep fascia was divided proximal to the medial malleolus and the posterior tibial tendon was pulled into the proximal portion of the wound.  Again, the tendon was inspected, showing degenerative changes and interstitial tearing.  There was excellent play within the muscle belly.  The flexor digitorum longus tendon was identified proximally and was normal in appearance.  The deep fascia was divided distally from the tuberosity of navicular to its distal end.  The superficial intrinsic muscles of the foot were reflected plantarward and we were able to digitally dissect within the deep plantar space.  The origin of the flexor hallucis brevis muscles was released and the flexor digitorum longus and flexor hallucis longus tendons were identified.  The flexor digitorum longus tendon was divided at its division to the four toes and the tendon was then pulled into the proximal portion of the wound and then passed distally through the tunnel previously occupied by the posterior tibial tendon.  Using C-arm image intensifier, the tuberosity of the navicular was identified and a drill hole was placed into the navicular, forming a tunnel for tendon passage.  The tendon stump of the posterior tibial tendon was split longitudinally at the site of the plantar medial drill hole.  The flexor digitorum  longus tendon was then passed from plantar medial to dorsal medial and with the foot held in adduction and plantarflexion, there was tension placed upon the tendon transfer and it was sewn into position with multiple stitches of 0 Vicryl suture.  After completion of the repair of the transfer, good resting tension was noted in the tendon.  The stump of the posterior tibial tendon proximally was sewed side to side into the  tendon transfer to add more strength to motor.  The tendon itself was then resected just distal to this anastomosis and sent to Pathology.  Good resting tension was seen on the reconstruction.  The wound was copiously irrigated.  A medium Hemovac drain was placed into the wound.  The deep fascia was then closed in interrupted fashion using 2-0 Monocryl suture.  The superficial fascia was closed in interrupted fashion using 2-0 Monocryl suture.  The skin was closed with approximating staples.  Sterile compressive postoperative dressing with plaster splint holding the foot in gravity equinus and inversion was applied.  The suction drain was activated and the patient was awakened from anesthesia, tourniquet was deflated, and she was taken to the recovery room in satisfactory  condition.      Yvone NeuJOHN VAN MANEN, MD      [id number]_I/  D:  06/02/2018 9:12  T:  06/02/2018 16:51  JOB #:  [id number]

## 2018-06-02 NOTE — Anesthesia Procedure Notes (Deleted)
Peripheral Block    Start time: 06/02/2018 7:02 AM  End time: 06/02/2018 7:12 AM  Performed by: Clovis RileyWittenbraker, William E, CRNA  Authorized by: Rufina FalcoBritton, Alison E, MD       Pre-procedure:   Indications: at surgeon's request and post-op pain management    Preanesthetic Checklist: patient identified, risks and benefits discussed, site marked, timeout performed, anesthesia consent given and patient being monitored    Timeout Time: 07:02          Block Type:   Block Type:  Popliteal and saphenous  Laterality:  Left  Monitoring:  Continuous pulse ox, frequent vital sign checks, heart rate and responsive to questions  Injection Technique:  Single shot  Procedures: ultrasound guided    Patient Position: supine  Prep: chlorhexidine    Location:  Mid thigh  Needle Type:  Stimuplex  Needle Gauge:  21 G  Needle Localization:  Ultrasound guidance    Assessment:  Number of attempts:  1  Injection Assessment:  Incremental injection every 5 mL, local visualized surrounding nerve on ultrasound, negative aspiration for blood, no paresthesia and no intravascular symptoms  Patient tolerance:  Patient tolerated the procedure well with no immediate complications

## 2018-06-02 NOTE — Interval H&P Note (Signed)
Dr. Micheline RoughBritton aware of notice and reports she will sign patient out as soon as she can.

## 2018-06-02 NOTE — Op Note (Signed)
dictated

## 2018-06-02 NOTE — Anesthesia Pre-Procedure Evaluation (Signed)
Relevant Problems   No relevant active problems       Anesthetic History     PONV (does well with IV medications)          Review of Systems / Medical History  Patient summary reviewed and pertinent labs reviewed    Pulmonary        Sleep apnea: No treatment    Asthma        Neuro/Psych         Psychiatric history     Cardiovascular    Hypertension  Valvular problems/murmurs (moderate AS): aortic stenosis        Hyperlipidemia    Exercise tolerance: <4 METS  Comments: EF 69%   GI/Hepatic/Renal     GERD: well controlled           Endo/Other        Morbid obesity and arthritis     Other Findings   Comments: Connective tissue disease  Fibromyalgia           Physical Exam    Airway  Mallampati: III  TM Distance: 4 - 6 cm  Neck ROM: normal range of motion   Mouth opening: Normal     Cardiovascular    Rhythm: regular  Rate: normal         Dental    Dentition: Upper dentition intact and Lower dentition intact     Pulmonary  Breath sounds clear to auscultation               Abdominal         Other Findings            Anesthetic Plan    ASA: 3  Anesthesia type: regional and general - popliteal fossa block      Post-op pain plan if not by surgeon: peripheral nerve block single    Induction: Intravenous  Anesthetic plan and risks discussed with: Patient

## 2018-06-02 NOTE — Progress Notes (Signed)
physical Therapy EVALUATION  (Ambulatory surgery, emergency room & recovery room patients)    Patient: [name] [age] y.o. female)  Date: 06/02/2018  Primary Diagnosis and Medical History: LEFT POSTERIOR TIBIAL TENDON DYSFUNCTION  Procedure(s) (LRB):  LEFT POSTERIOR TIBIAL TENDON RECONSTRUCTION (GENERAL WITH POPLITEAL BLOCK) (Left) Day of Surgery   Past Medical History:   Diagnosis Date   ??? Aortic stenosis    ??? Arthritis     osteo arthritis   ??? Asthma    ??? Autoimmune disease (HCC)     connective tissue disease   ??? GERD (gastroesophageal reflux disease)    ??? High cholesterol    ??? Hypertension    ??? Nausea & vomiting    ??? Other and unspecified symptoms and signs involving general sensations and perceptions     fibromyalgia/osteo arthritis   ??? Other ill-defined conditions(799.89)     heart mumur   ??? Thyroid disease     goiter/no longer on medication     Past Surgical History:   Procedure Laterality Date   ??? HX HYSTERECTOMY     ??? HX ORTHOPAEDIC      right rotator cuff repair   ??? HX ORTHOPAEDIC      l4-l5 spinal fusion   ??? HX ORTHOPAEDIC  08/23/12    RIGHT L4 AND L5 DECOMPRESSION   ??? HX OTHER SURGICAL      cystectomy right shoulder blade   ??? HX OTHER SURGICAL      wisdom teeth extraction   ??? HX TUBAL LIGATION       Patient Active Problem List   Diagnosis Code   ??? Obesity, morbid (HCC) E66.01   ??? Hypertension I10   ??? High cholesterol E78.00   ??? Aortic stenosis I35.0     Prior Level of Function/Home Situation: Independent community ambulator without assistive device.  Personal factors and/or comorbidities impacting plan of care:     Home Situation  Home Environment: Private residence  One/Two Story Residence: Two story  Living Alone: No  Support Systems: Family member(s)  Patient Expects to be Discharged to:: Private residence  Ordered Weight Bearing Status:  left non-weight  Equipment: crutches and walker    EXAMINATION/PRESENTATION/DECISION MAKING:   Critical Behavior:   Neurologic State: Alert, Drowsy, Eyes open to voice           Transfers:  Overall level of assistance required following instruction: modified independence given verbal using axillary crutches.  Ambulation:  Weight bearing status during ambulation:                   Stair Management:           Strength/ROM Limitations:    Pain:  Pain Scale 1: Numeric (0 - 10)  Pain Intensity 1: 0       Education:  Role of P.T. explained to the patient:  [x]   Yes              []    No       Topics addressed: Comments:   [x]                                     Device use and technique    [x]   Transfer technique    [x]                                     Gait training    [x]                                     Stair training        Patient is discharged from physical therapy at this time.    Worthy Flank, PT,WCC.   Time Calculation: 27 mins

## 2018-06-02 NOTE — Other (Signed)
Pt's spouse updated on pt's care with plan for discharge with understanding being verbalized.

## 2018-06-02 NOTE — Anesthesia Procedure Notes (Cosign Needed)
Peripheral Block    Start time: 06/02/2018 7:02 AM  End time: 06/02/2018 7:12 AM  Performed by: Wittenbraker, William E, CRNA  Authorized by: Britton, Alison E, MD       Pre-procedure:   Indications: at surgeon's request and post-op pain management    Preanesthetic Checklist: patient identified, risks and benefits discussed, site marked, timeout performed, anesthesia consent given and patient being monitored    Timeout Time: 07:02          Block Type:   Block Type:  Popliteal and saphenous  Laterality:  Left  Monitoring:  Continuous pulse ox, frequent vital sign checks, heart rate and responsive to questions  Injection Technique:  Single shot  Procedures: ultrasound guided    Patient Position: supine  Prep: chlorhexidine    Location:  Mid thigh  Needle Type:  Stimuplex  Needle Gauge:  21 G  Needle Localization:  Ultrasound guidance    Assessment:  Number of attempts:  1  Injection Assessment:  Incremental injection every 5 mL, local visualized surrounding nerve on ultrasound, negative aspiration for blood, no paresthesia and no intravascular symptoms  Patient tolerance:  Patient tolerated the procedure well with no immediate complications

## 2018-06-02 NOTE — Other (Signed)
Peripheral nerve block pamphlet provided along with discharge instructions.

## 2018-06-02 NOTE — Anesthesia Post-Procedure Evaluation (Signed)
Procedure(s):  LEFT POSTERIOR TIBIAL TENDON RECONSTRUCTION (GENERAL WITH POPLITEAL BLOCK).    regional, general    Anesthesia Post Evaluation      Multimodal analgesia: multimodal analgesia used between 6 hours prior to anesthesia start to PACU discharge  Patient location during evaluation: bedside  Patient participation: complete - patient participated  Level of consciousness: awake  Pain management: adequate  Airway patency: patent  Anesthetic complications: no  Cardiovascular status: acceptable  Respiratory status: acceptable  Hydration status: acceptable        Vitals Value Taken Time   BP 128/56 06/02/2018 10:15 AM   Temp 37 ??C (98.6 ??F) 06/02/2018 10:15 AM   Pulse 84 06/02/2018 10:15 AM   Resp 12 06/02/2018 10:15 AM   SpO2 95 % 06/02/2018 10:15 AM

## 2018-06-02 NOTE — Other (Signed)
Dr. Britton aware of notice and reports she will sign patient out as soon as she can.

## 2018-06-02 NOTE — Other (Signed)
Patient Fall Protocol  Yellow arm band applied to patient and yellow non skid socks placed on  Bed in low position, all side rails up, call bell in reach  Pt and Family instructed in "call- don't fall" protocol   -use your call bell, wait for assistance, staff not family will assist you to get up and move about   Pt and family verbalize understanding of fall precautions and the "call don't fall" Protocol

## 2018-06-02 NOTE — Op Note (Signed)
[facility]  OPERATIVE RE[facility]    Name:  Ian BushmanCLASS, Colton  MR#:  [id number]  DOB:  29-Oct-1950  ACCOUNT #:  [id number]  DATE OF SERVICE:  06/02/2018    PREOPERATIVE DIAGNOSIS:  Left posterior tibial tendon dysfunction.    POSTOPERATIVE DIAGNOSIS:  Left posterior tibial tendon dysfunction.    PROCEDURE PERFORMED:  Left posterior tibial tendon reconstruction with flexor digitorum longus tendon transfer.    SURGEON:  Yvone NeuJohn Van Manen, MD    ASSISTANT:  None.    ANESTHESIA:  General with popliteal block.    COMPLICATIONS:  None encountered.    SPECIMENS REMOVED:  Posterior tibial tendon.    IMPLANTS:  None.    ESTIMATED BLOOD LOSS:  Nil.    TOURNIQUET TIME:  60 minutes.    PROCEDURE IN DETAIL:  After consents were obtained and preoperative sedation, the patient was taken to the operating suite.  She underwent a popliteal block and then general endotracheal anesthesia without difficulty.  A pneumatic tourniquet was placed around the left thigh and the left lower extremity was prepped and draped in the usual sterile fashion.  After Esmarch exsanguination, the tourniquet was inflated to 350 mmHg.  The incision was mapped out on the medial aspect of the ankle.  The incision was started over the posterior tibial tendon musculotendinous junction, about 8 cm proximal to the distal tip of the medial malleolus and extended distally along the course of the posterior tibial tendon to the navicular tuberosity and then along the medial border of the foot to just proximal to the first metatarsal head.  The incision distally was carried out at the junction between the plantar and dorsal skin.  The incision was carried down through skin and subcutaneous tissue, down to the deep fascia.  A plane was developed anteriorly and posteriorly and venous bleeders were Bovie cauterized.  The deep fascia and retinaculum were divided between the medial malleolus and tuberosity of the navicular.   There was fibrosis of the tendon against the deep fascia.  There were multiple split tear in the tendon and the tendon was atretic at its insertion.  The tendon was released from its insertion, leaving a stump of the tendon at the tuberosity.  The deep fascia was divided proximal to the medial malleolus and the posterior tibial tendon was pulled into the proximal portion of the wound.  Again, the tendon was inspected, showing degenerative changes and interstitial tearing.  There was excellent play within the muscle belly.  The flexor digitorum longus tendon was identified proximally and was normal in appearance.  The deep fascia was divided distally from the tuberosity of navicular to its distal end.  The superficial intrinsic muscles of the foot were reflected plantarward and we were able to digitally dissect within the deep plantar space.  The origin of the flexor hallucis brevis muscles was released and the flexor digitorum longus and flexor hallucis longus tendons were identified.  The flexor digitorum longus tendon was divided at its division to the four toes and the tendon was then pulled into the proximal portion of the wound and then passed distally through the tunnel previously occupied by the posterior tibial tendon.  Using C-arm image intensifier, the tuberosity of the navicular was identified and a drill hole was placed into the navicular, forming a tunnel for tendon passage.  The tendon stump of the posterior tibial tendon was split longitudinally at the site of the plantar medial drill hole.  The flexor digitorum  longus tendon was then passed from plantar medial to dorsal medial and with the foot held in adduction and plantarflexion, there was tension placed upon the tendon transfer and it was sewn into position with multiple stitches of 0 Vicryl suture.  After completion of the repair of the transfer, good resting tension was noted in the tendon.  The stump of the posterior tibial tendon  proximally was sewed side to side into the tendon transfer to add more strength to motor.  The tendon itself was then resected just distal to this anastomosis and sent to Pathology.  Good resting tension was seen on the reconstruction.  The wound was copiously irrigated.  A medium Hemovac drain was placed into the wound.  The deep fascia was then closed in interrupted fashion using 2-0 Monocryl suture.  The superficial fascia was closed in interrupted fashion using 2-0 Monocryl suture.  The skin was closed with approximating staples.  Sterile compressive postoperative dressing with plaster splint holding the foot in gravity equinus and inversion was applied.  The suction drain was activated and the patient was awakened from anesthesia, tourniquet was deflated, and she was taken to the recovery room in satisfactory  condition.      Yvone NeuJOHN VAN MANEN, MD      [id number]_I/  D:  06/02/2018 9:12  T:  06/02/2018 16:51  JOB #:  [id number]

## 2018-06-02 NOTE — Brief Op Note (Signed)
BRIEF OPERATIVE NOTE    Date of Procedure: 06/02/2018   Preoperative Diagnosis: LEFT POSTERIOR TIBIAL TENDON DYSFUNCTION  Postoperative Diagnosis: LEFT POSTERIOR TIBIAL TENDON DYSFUNCTION    Procedure(s):  LEFT POSTERIOR TIBIAL TENDON RECONSTRUCTION (GENERAL WITH POPLITEAL BLOCK)  Surgeon(s) and Role:     * Jaquia Benedicto, MD - Primary         Surgical Assistant: 0    Surgical Staff:  Circ-1: Copeland, Mandy L, RN  Scrub Tech-1: Jackson, Jasmine  Retractor Holder: Raines, Tia D  Float Staff: Dunlap, Patricia, RN  Event Time In Time Out   Incision Start 0759    Incision Close 0859      Anesthesia: General   Estimated Blood Loss: 0  Specimens:   ID Type Source Tests Collected by Time Destination   1 : left tibial tendon  Preservative Foot, left  Derriona Branscom, MD 06/02/2018 0835 Pathology      Findings: 0   Complications: 0  Implants: * No implants in log *

## 2018-10-22 MED ORDER — INDAPAMIDE 2.5 MG TAB
2.5 mg | ORAL_TABLET | ORAL | 1 refills | Status: DC
Start: 2018-10-22 — End: 2019-04-04

## 2019-04-04 MED ORDER — INDAPAMIDE 2.5 MG TAB
2.5 mg | ORAL_TABLET | ORAL | 3 refills | Status: AC
Start: 2019-04-04 — End: ?

## 2019-06-02 ENCOUNTER — Ambulatory Visit: Attending: Cardiovascular Disease | Primary: Internal Medicine

## 2019-09-29 ENCOUNTER — Encounter

## 2019-10-10 ENCOUNTER — Inpatient Hospital Stay: Admit: 2019-10-10 | Payer: MEDICARE | Attending: Family Medicine | Primary: Internal Medicine

## 2019-10-10 DIAGNOSIS — M76822 Posterior tibial tendinitis, left leg: Secondary | ICD-10-CM

## 2021-01-02 ENCOUNTER — Telehealth: Payer: Self-pay | Admitting: *Deleted

## 2021-01-02 NOTE — Telephone Encounter (Signed)
Received records from [facility]---called to see if patient needed to schedule a New Patient appointment

## 2021-01-09 NOTE — Progress Notes (Unsigned)
Cardiology Office Note:   Date:  01/10/2021  NAME:  Katherine Waters    MRN: [id number] DOB:  03-Jan-1951   PCP:  Maude Leriche, PA-C  Cardiologist:  No primary care provider on file.   Referring MD: No ref. provider found   Chief Complaint  Patient presents with  . Aortic Stenosis   History of Present Illness:   Katherine Waters is a 70 y.o. female with a hx of aortic stenosis, HTN, HLD who is being seen today for the evaluation of aortic stenosis at the request of Scifres, Earlie Server, PA-C.  She reports for the past few years has been short of breath.  Symptoms have progressed over the last 6 months.  She gets short of breath with any heavy activity.  She is a former smoker but barely smoked.  She has mild COPD.  EKG in office demonstrates sinus rhythm with poor R wave progression.  Her blood pressure was 152/80.  She is recently relocated from [place] to [facility] to be closer to her son and granddaughter.  She reports no chest pain.  Symptoms are basically shortness of breath.  No lower extremity edema is reported.  She has a 3 out of 6 late peaking systolic ejection murmur consistent with severe aortic stenosis.  I did review her echocardiogram from September 2021.  All findings were consistent with severe aortic stenosis.  Really unclear what the hold-up was regarding TAVR evaluation.  I cannot see her recent lab work.  She does need updated panel today.  She is a former smoker and smoked for 6 years in the past.  She drinks alcohol moderation.  No drug use is reported.  She is a retired Radiation protection practitioner.  She has 1 biological son and 1 stepson.  She did have a left heart catheterization per her report.  I cannot see those records but it was reported to be normal.  She will need repeat testing.  We did discuss her need for further testing.  Records reviewed from Prior cardiologist [facility], [place])  ETT 07/12/2020 5:01 min;s 7 METS Echo 06/20/2020   Vmax 4.01 m/s; MG 41 mmHG; AVA 0.88 cm2  Problem List 1. Aortic stenosis, severe 2. HTN 3. HLD 4. OSA, mild 5. 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 Aortic stenosis   . Asthma   . Connective tissue disease ([place])   . Hyperlipidemia   . Hypertension     Past Surgical History: Past Surgical History:  Procedure Laterality Date  . CARDIAC CATHETERIZATION    . LUMBAR FUSION    . ROTATOR CUFF REPAIR      Current Medications: Current Meds  Medication Sig  . acetaminophen (TYLENOL) 325 MG tablet Take 325 mg by mouth every 6 (six) hours as needed.  Marland Kitchen amLODipine (NORVASC) 10 MG tablet Take 10 mg by mouth daily.  Marland Kitchen Apple Cider Vinegar 500 MG TABS Take 2 tablets by mouth daily.  . Cholecalciferol (VITAMIN D) 50 MCG (2000 UT) CAPS Take 1 capsule by mouth daily.  . DULoxetine (CYMBALTA) 60 MG capsule Take 60 mg by mouth daily.  . indapamide (LOZOL) 2.5 MG tablet Take 2.5 mg by mouth daily.  . Magnesium 500 MG TABS Take 1 tablet by mouth daily.  . meloxicam (MOBIC) 15 MG tablet Once daily as needed for pain  . omeprazole (PRILOSEC) 40 MG capsule Take 40 mg  by mouth daily.  . rosuvastatin (CRESTOR) 10 MG tablet Take 10 mg by mouth daily.  Marland Kitchen telmisartan (MICARDIS) 80 MG tablet Take 80 mg by mouth daily.  . Turmeric 500 MG CAPS Take 1 tablet by mouth daily.  . vitamin B-12 (CYANOCOBALAMIN) 500 MCG tablet Take 500 mcg by mouth daily.     Allergies:    Latex and Sulfa antibiotics   Social History: Social History   Socioeconomic History  . Marital status: Married    Spouse name: Not on file  . Number of children: 1  . Years of education: Not on file  . Highest education level: Not on file  Occupational History  . Occupation: retired bookkeeper  Tobacco Use  . Smoking status: Former Smoker    Packs/day: 0.50    Years: 6.00    Pack years: 3.00    Quit date: 1976    Years  since quitting: 46.2  . Smokeless tobacco: Never Used  Substance and Sexual Activity  . Alcohol use: Never  . Drug use: Never  . Sexual activity: Not on file  Other Topics Concern  . Not on file  Social History Narrative  . Not on file   Social Determinants of Health   Financial Resource Strain: Not on file  Food Insecurity: Not on file  Transportation Needs: Not on file  Physical Activity: Not on file  Stress: Not on file  Social Connections: Not on file     Family History: The patient's family history includes COPD in her mother; Cancer in her sister; Cirrhosis in her brother; Heart attack (age of onset: 79) in her father; Heart disease in her brother.  ROS:   All other ROS reviewed and negative. Pertinent positives noted in the HPI.     EKGs/Labs/Other Studies Reviewed:   The following studies were personally reviewed by me today:  EKG:  EKG is ordered today.  The ekg ordered today demonstrates normal sinus rhythm heart rate 77, poor R wave progression, cannot rule out anterior infarct, and was personally reviewed by me.   NM Stress 01/12/2018  Baseline ECG: Sinus rhythm.   Gated SPECT: Left ventricular function post-stress was normal.  Calculated ejection fraction is 69%. There is no evidence of transient  ischemic dilation (TID).   Left ventricular perfusion is probably normal.   Myocardial perfusion imaging defect 1: There is a defect that is small  in size present in the basal inferior location(s) that is reversible.  There is normal wall motion in the defect area. The defect appears to  probably be artifact caused by motion. Perfusion defect was visually  present without quantitative evidence.   Myocardial perfusion imaging supports a low risk stress test.   Echo 06/20/2020  Vmax 4.01 m/s; MG 41 mmHG; AVA 0.88 cm2  Recent Labs: No results found for requested labs within last 8760 hours.   Recent Lipid Panel No results found for: CHOL, TRIG, HDL, CHOLHDL,  VLDL, LDLCALC, LDLDIRECT  Physical Exam:   VS:  BP (!) 152/80 (BP Location: Left Arm, Patient Position: Sitting, Cuff Size: Large)   Pulse 77   Ht 5' 2.75" (1.594 m)   Wt 222 lb (100.7 kg)   SpO2 96%   BMI 39.64 kg/m    Wt Readings from Last 3 Encounters:  01/10/21 222 lb (100.7 kg)    General: Well nourished, well developed, in no acute distress Head: Atraumatic, normal size  Eyes: PEERLA, EOMI  Neck: Supple, no JVD Endocrine: No thryomegaly Cardiac: Normal  S1, S2; RRR; 3 out of 6 late peaking systolic ejection murmur consistent with severe aortic stenosis, radiates into the carotids Lungs: Clear to auscultation bilaterally, no wheezing, rhonchi or rales  Abd: Soft, nontender, no hepatomegaly  Ext: No edema, pulses 2+ Musculoskeletal: No deformities, BUE and BLE strength normal and equal Skin: Warm and dry, no rashes   Neuro: Alert and oriented to person, place, time, and situation, CNII-XII grossly intact, no focal deficits  Psych: Normal mood and affect   ASSESSMENT:   Katherine Waters is a 70 y.o. female who presents for the following: 1. SOB (shortness of breath) on exertion   2. Nonrheumatic aortic valve stenosis   3. Primary hypertension   4. Obesity (BMI 30-39.9)   5. Mixed hyperlipidemia     PLAN:   1. SOB (shortness of breath) on exertion 2. Nonrheumatic aortic valve stenosis -She presents with shortness of breath and severe aortic stenosis.  She had severe AS in September 2021.  She is symptomatic.  V-max was over 4 m/s and mean gradient 41.  She reports he had a left heart catheterization last year that showed no significant CAD.  I cannot see those records.  She will need repeat work-up anyway. -We will proceed with full lab work today BMP, CBC, TSH and lipid profile. -We will repeat her echocardiogram. -I did discuss her case with the TAVR coordinator.  We will go ahead and proceed with work-up.  Her murmur is consistent with severe AS.  She has symptoms.  She needs  surgery.  Hopefully she will be a good candidate for TAVR.  3. Primary hypertension -Continue current medications.  4. Obesity (BMI 30-39.9) -Weight loss recommended.  5. Mixed hyperlipidemia -Lipid profile today.  Disposition: Return in about 6 months (around 07/13/2021).  Medication Adjustments/Labs and Tests Ordered: Current medicines are reviewed at length with the patient today.  Concerns regarding medicines are outlined above.  Orders Placed This Encounter  Procedures  . Basic metabolic panel  . CBC  . Lipid panel  . TSH  . EKG 12-Lead  . ECHOCARDIOGRAM COMPLETE   No orders of the defined types were placed in this encounter.   Patient Instructions  Medication Instructions:  The current medical regimen is effective;  continue present plan and medications.  *If you need a refill on your cardiac medications before your next appointment, please call your pharmacy*   Lab Work: BMET, CBC, TSH, LIPID today   If you have labs (blood work) drawn today and your tests are completely normal, you will receive your results only by: Marland Kitchen MyChart Message (if you have MyChart) OR . A paper copy in the mail If you have any lab test that is abnormal or we need to change your treatment, we will call you to review the results.   Testing/Procedures: Echocardiogram - Your physician has requested that you have an echocardiogram. Echocardiography is a painless test that uses sound waves to create images of your heart. It provides your doctor with information about the size and shape of your heart and how well your heart's chambers and valves are working. This procedure takes approximately one hour. There are no restrictions for this procedure.     Follow-Up: At [facility], you and your health needs are our priority.  As part of our continuing mission to provide you with exceptional heart care, we have created designated Provider Care Teams.  These Care Teams include your primary  Cardiologist (physician) and Advanced Practice Providers (APPs -  Physician Assistants and Nurse Practitioners) who all work together to provide you with the care you need, when you need it.  We recommend signing up for the patient portal called "MyChart".  Sign up information is provided on this After Visit Summary.  MyChart is used to connect with patients for Virtual Visits (Telemedicine).  Patients are able to view lab/test results, encounter notes, upcoming appointments, etc.  Non-urgent messages can be sent to your provider as well.   To learn more about what you can do with MyChart, go to [email or web address].    Your next appointment:   6 month(s)  The format for your next appointment:   In Person  Provider:   Eleonore Chiquito, MD        Signed, Addison Naegeli. Audie Box, MD, [place]  [address], [place], [place] [address] [phone]  01/10/2021 12:37 PM

## 2021-01-10 ENCOUNTER — Other Ambulatory Visit: Payer: Self-pay

## 2021-01-10 ENCOUNTER — Encounter: Payer: Self-pay | Admitting: Cardiovascular Disease

## 2021-01-10 ENCOUNTER — Ambulatory Visit (INDEPENDENT_AMBULATORY_CARE_PROVIDER_SITE_OTHER): Payer: Medicare Other | Admitting: Cardiovascular Disease

## 2021-01-10 VITALS — BP 152/80 | HR 77 | Ht 62.75 in | Wt 222.0 lb

## 2021-01-10 DIAGNOSIS — I10 Essential (primary) hypertension: Secondary | ICD-10-CM | POA: Diagnosis not present

## 2021-01-10 DIAGNOSIS — E782 Mixed hyperlipidemia: Secondary | ICD-10-CM

## 2021-01-10 DIAGNOSIS — E669 Obesity, unspecified: Secondary | ICD-10-CM | POA: Diagnosis not present

## 2021-01-10 DIAGNOSIS — R0602 Shortness of breath: Secondary | ICD-10-CM | POA: Diagnosis not present

## 2021-01-10 DIAGNOSIS — I350 Nonrheumatic aortic (valve) stenosis: Secondary | ICD-10-CM

## 2021-01-10 LAB — BASIC METABOLIC PANEL
BUN/Creatinine Ratio: 28 (ref 12–28)
BUN: 22 mg/dL (ref 8–27)
CO2: 26 mmol/L (ref 20–29)
Calcium: 10.1 mg/dL (ref 8.7–10.3)
Chloride: 99 mmol/L (ref 96–106)
Creatinine, Ser: 0.8 mg/dL (ref 0.57–1.00)
Glucose: 110 mg/dL — ABNORMAL HIGH (ref 65–99)
Potassium: 4.1 mmol/L (ref 3.5–5.2)
Sodium: 141 mmol/L (ref 134–144)
eGFR: 79 mL/min/{1.73_m2} (ref >59)

## 2021-01-10 LAB — TSH: TSH: 1.16 u[IU]/mL (ref 0.450–4.500)

## 2021-01-10 LAB — LIPID PANEL
Chol/HDL Ratio: 2.3 ratio (ref 0.0–4.4)
Cholesterol, Total: 163 mg/dL (ref 100–199)
HDL: 70 mg/dL (ref >39)
LDL Chol Calc (NIH): 75 mg/dL (ref 0–99)
Triglycerides: 98 mg/dL (ref 0–149)
VLDL Cholesterol Cal: 18 mg/dL (ref 5–40)

## 2021-01-10 LAB — CBC
Hematocrit: 40.3 % (ref 34.0–46.6)
Hemoglobin: 14.1 g/dL (ref 11.1–15.9)
MCH: 32 pg (ref 26.6–33.0)
MCHC: 35 g/dL (ref 31.5–35.7)
MCV: 92 fL (ref 79–97)
Platelets: 274 10*3/uL (ref 150–450)
RBC: 4.4 x10E6/uL (ref 3.77–5.28)
RDW: 12.2 % (ref 11.7–15.4)
WBC: 6.9 10*3/uL (ref 3.4–10.8)

## 2021-01-10 NOTE — Patient Instructions (Signed)
Medication Instructions:  The current medical regimen is effective;  continue present plan and medications.  *If you need a refill on your cardiac medications before your next appointment, please call your pharmacy*   Lab Work: BMET, CBC, TSH, LIPID today   If you have labs (blood work) drawn today and your tests are completely normal, you will receive your results only by: Marland Kitchen MyChart Message (if you have MyChart) OR . A paper copy in the mail If you have any lab test that is abnormal or we need to change your treatment, we will call you to review the results.   Testing/Procedures: Echocardiogram - Your physician has requested that you have an echocardiogram. Echocardiography is a painless test that uses sound waves to create images of your heart. It provides your doctor with information about the size and shape of your heart and how well your heart's chambers and valves are working. This procedure takes approximately one hour. There are no restrictions for this procedure.     Follow-Up: At [facility], you and your health needs are our priority.  As part of our continuing mission to provide you with exceptional heart care, we have created designated Provider Care Teams.  These Care Teams include your primary Cardiologist (physician) and Advanced Practice Providers (APPs -  Physician Assistants and Nurse Practitioners) who all work together to provide you with the care you need, when you need it.  We recommend signing up for the patient portal called "MyChart".  Sign up information is provided on this After Visit Summary.  MyChart is used to connect with patients for Virtual Visits (Telemedicine).  Patients are able to view lab/test results, encounter notes, upcoming appointments, etc.  Non-urgent messages can be sent to your provider as well.   To learn more about what you can do with MyChart, go to [email or web address].    Your next appointment:   6 month(s)  The format for your  next appointment:   In Person  Provider:   Eleonore Chiquito, MD

## 2021-01-15 ENCOUNTER — Other Ambulatory Visit: Payer: Self-pay

## 2021-01-15 ENCOUNTER — Ambulatory Visit (HOSPITAL_COMMUNITY): Payer: Medicare Other | Attending: Internal Medicine

## 2021-01-15 ENCOUNTER — Encounter: Payer: Self-pay | Admitting: Cardiovascular Disease

## 2021-01-15 ENCOUNTER — Ambulatory Visit (INDEPENDENT_AMBULATORY_CARE_PROVIDER_SITE_OTHER): Payer: Medicare Other | Admitting: Cardiovascular Disease

## 2021-01-15 VITALS — BP 136/70 | HR 77 | Ht 62.75 in | Wt 220.0 lb

## 2021-01-15 DIAGNOSIS — R0602 Shortness of breath: Secondary | ICD-10-CM | POA: Diagnosis present

## 2021-01-15 DIAGNOSIS — I350 Nonrheumatic aortic (valve) stenosis: Secondary | ICD-10-CM

## 2021-01-15 LAB — ECHOCARDIOGRAM COMPLETE
AR max vel: 0.82 cm2
AV Area VTI: 0.81 cm2
AV Area mean vel: 0.91 cm2
AV Mean grad: 69 mmHg
AV Peak grad: 112.4 mmHg
Ao pk vel: 5.3 m/s
Area-P 1/2: 2.51 cm2
P 1/2 time: 382 msec
S' Lateral: 2.5 cm

## 2021-01-15 NOTE — Progress Notes (Signed)
[facility]   MULTIDISCIPLINARY HEART VALVE TEAM  Date:  01/16/2021   ID:  Katherine Waters, DOB 03-Aug-1951, MRN [id number]  PCP:  Maude Leriche, PA-C   Chief Complaint  Patient presents with  . Shortness of Breath     HISTORY OF PRESENT ILLNESS: Katherine Waters is a 70 y.o. female who presents for evaluation of aortic stenosis, referred by Dr Audie Box.   The patient is here with her husband today. She has had a heart murmur for several decades. She has developed progressive shortness of breath, reporting progression 'over the years.' She has longstanding shortness of breath, but more significant symptom progression over the past 6 months. She underwent extensive pulmonary testing last year in [place], [place]. It was felt that most likely aortic stenosis was causing her shortness of breath. States her lungs 'checked out fine.' She was followed by cardiology previously, and was told that her aortic stenosis wasn't bad enough for replacement yet. The patient has now established with Dr Audie Box who has evaluated her and feels she clearly has severe symptomatic aortic stenosis. Outside echo reports aortic valve Vmax of 4.1 m/s, mean gradient 41 mmHg, and AVA 0.88 square cm from September 2021. She underwent an updated echo study today.   The patient also reports 2 episodes of PND that occurred within the past few months. She is now short of breath with low level activity such as one flight of stairs or walking the dog. She also experiences a feeling in her central chest that she describes as a 'heart ache' with walking.   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 This has all been stable over recent years.   The patient has had regular dental care and reports no recent problems.  Past Medical History:  Diagnosis Date  . Aortic stenosis   . Asthma   . Connective tissue disease  ([place])   . Hyperlipidemia   . Hypertension     Current Outpatient Medications  Medication Sig Dispense Refill  . acetaminophen (TYLENOL) 325 MG tablet Take 325 mg by mouth every 6 (six) hours as needed.    Marland Kitchen amLODipine (NORVASC) 10 MG tablet Take 10 mg by mouth daily.    Marland Kitchen Apple Cider Vinegar 500 MG TABS Take 1 tablet by mouth daily.    . Cholecalciferol (VITAMIN D) 50 MCG (2000 UT) CAPS Take 1 capsule by mouth daily.    . DULoxetine (CYMBALTA) 60 MG capsule Take 60 mg by mouth daily.    . indapamide (LOZOL) 2.5 MG tablet Take 2.5 mg by mouth daily.    . Magnesium 100 MG CAPS Take 1 tablet by mouth daily.    . meloxicam (MOBIC) 15 MG tablet Once daily as needed for pain    . omeprazole (PRILOSEC) 40 MG capsule Take 40 mg by mouth daily.    . rosuvastatin (CRESTOR) 10 MG tablet Take 10 mg by mouth daily.    Marland Kitchen telmisartan (MICARDIS) 80 MG tablet Take 80 mg by mouth daily.    . Turmeric 500 MG CAPS Take 1 tablet by mouth daily.    . vitamin B-12 (CYANOCOBALAMIN) 500 MCG tablet Take 500 mcg by mouth daily.     No current facility-administered medications for this visit.    ALLERGIES:   Latex and Sulfa antibiotics   SOCIAL HISTORY:  The patient  reports that she quit smoking about 46  years ago. She has a 3.00 pack-year smoking history. She has never used smokeless tobacco. She reports that she does not drink alcohol and does not use drugs.   FAMILY HISTORY:  The patient's family history includes COPD in her mother; Cancer in her sister; Cirrhosis in her brother; Heart attack (age of onset: 40) in her father; Heart disease in her brother.   REVIEW OF SYSTEMS:  Positive for arthritis in her hands and feet, Raynaud's, connective tissue disease.   All other systems are reviewed and negative.   PHYSICAL EXAM: VS:  BP 136/70   Pulse 77   Ht 5' 2.75" (1.594 m)   Wt 220 lb (99.8 kg)   SpO2 96%   BMI 39.28 kg/m  , BMI Body mass index is 39.28 kg/m. GEN: Well nourished, well developed, in no  acute distress HEENT: normal Neck: No JVD. carotids 2+ without bruits or masses Cardiac: The heart is RRR with a harsh 3/6 late peaking systolic murmur at the RUSB. No edema. Pedal pulses 2+ = bilaterally  Respiratory:  clear to auscultation bilaterally GI: soft, nontender, nondistended, + BS MS: no deformity or atrophy Skin: warm and dry, no rash Neuro:  Strength and sensation are intact Psych: euthymic mood, full affect  EKG:  EKG from 01/10/21 reviewed and demonstrates NSR, possible LAE, NSSTT changes  RECENT LABS: 01/10/2021: BUN 22; Creatinine, Ser 0.80; Hemoglobin 14.1; Platelets 274; Potassium 4.1; Sodium 141; TSH 1.160  01/10/2021: Chol/HDL Ratio 2.3; Cholesterol, Total 163; HDL 70; LDL Chol Calc (NIH) 75; Triglycerides 98   Estimated Creatinine Clearance: 73.3 mL/min (by C-G formula based on SCr of 0.8 mg/dL).   Wt Readings from Last 3 Encounters:  01/15/21 220 lb (99.8 kg)  01/10/21 222 lb (100.7 kg)     CARDIAC STUDIES:  Echo:  IMPRESSIONS    1. Very severe aortic valve stenosis. DI is 0.26, impacted by  hyperdynamic state and high flow velocities. The aortic valve is abnormal.  There is severe calcifcation of the aortic valve. Aortic valve  regurgitation is trivial. Very severe aortic valve  stenosis. Aortic valve area, by VTI measures 0.81 cm. Aortic valve mean  gradient measures 69.0 mmHg from right sternal border. Aortic valve Vmax  measures 5.30 m/s.  2. Left ventricular ejection fraction, by estimation, is 65 to 70%. The  left ventricle has normal function. The left ventricle has no regional  wall motion abnormalities. There is moderate asymmetric left ventricular  hypertrophy of the basal-septal and  and mild LVH in other segments. Left ventricular diastolic parameters are  consistent with Grade II diastolic dysfunction (pseudonormalization).  Elevated left atrial pressure. The average left ventricular global  longitudinal strain is 18.8 %. The global   longitudinal strain is normal.  3. Right ventricular systolic function is normal. The right ventricular  size is normal. There is normal pulmonary artery systolic pressure. The  estimated right ventricular systolic pressure is 29.5 mmHg.  4. Left atrial size was severely dilated.  5. Degenerative mitral valve with mild-moderate mitral annular  calcification. DVI 0.51, MVA by continuity 1.7 cm2, however LVOT TVI  elevated, which may overestimate DVI and valve area due to hyperdynamic  state and high flow velocities. The mitral valve  is degenerative. Mild mitral valve regurgitation. Mild to moderate mitral  stenosis. The mean mitral valve gradient is 6.0 mmHg with average heart  rate of 77 bpm.  6. The inferior vena cava is normal in size with <50% respiratory  variability, suggesting right atrial pressure  of 8 mmHg.    Cardiac Cath: pending  STS RISK CALCULATOR: Isolated AVR: Risk of Mortality: 1.983% Renal Failure: 1.535% Permanent Stroke: 1.381% Prolonged Ventilation: 6.221% DSW Infection: 0.215% Reoperation: 3.024% Morbidity or Mortality: 10.906% Short Length of Stay: 39.019% Long Length of Stay: 4.217%  ASSESSMENT AND PLAN: 57.  70 year old woman with severe, stage D1 aortic stenosis.  This is associated with progressive [place] Heart Association functional [place] III symptoms of acute on chronic diastolic heart failure/exertional dyspnea.  I have reviewed the natural history of aortic stenosis with the patient and their family members who are present today. We have discussed the limitations of medical therapy and the poor prognosis associated with symptomatic aortic stenosis. We have reviewed potential treatment options, including palliative medical therapy, conventional surgical aortic valve replacement, and transcatheter aortic valve replacement. We discussed treatment options in the context of the patient's specific comorbid medical conditions.  I personally  reviewed her echo images today.  She has vigorous LV systolic function with no regional wall motion abnormalities.  There is severe calcification and restriction of the aortic valve leaflets.  Doppler data suggest very severe aortic stenosis with a mean gradient of 69 mmHg and peak systolic velocity of 5.3 m/s.  Calculated aortic valve area 0.8 cm.  We discussed further evaluation and which will require right and left heart catheterization.  The patient had a catheterization a few years ago that demonstrated no obstructive CAD per her report.  She understands that we need to evaluate her hemodynamics and reassess coronary anatomy.  Fortunately she is not having any anginal symptoms.  I have reviewed the risks, indications, and alternatives to cardiac catheterization, possible angioplasty, and stenting with the patient. Risks include but are not limited to bleeding, infection, vascular injury, stroke, myocardial infection, arrhythmia, kidney injury, radiation-related injury in the case of prolonged fluoroscopy use, emergency cardiac surgery, and death. The patient understands the risks of serious complication is 1-2 in 4235 with diagnostic cardiac cath and 1-2% or less with angioplasty/stenting. We will also perform CTA studies of the heart as well as the chest, abdomen, and pelvis.  Will determine anatomic suitability for TAVR based on cardiac catheterization and CTA data.  She would likely be a reasonable candidate for either conventional surgery or TAVR based on her age and comorbid medical problems.  Once all studies are completed, the patient will be referred for formal cardiac surgical consultation as part of a multidisciplinary team approach to her care.  Deatra James 01/16/2021 11:37 AM     [facility] [address] [place] [address]  [phone] (office) [phone] (fax)

## 2021-01-15 NOTE — H&P (View-Only) (Signed)
[facility]   MULTIDISCIPLINARY HEART VALVE TEAM  Date:  01/16/2021   ID:  Katherine Waters, DOB September 20, 1951, MRN [id number]  PCP:  Katherine Leriche, PA-C   Chief Complaint  Patient presents with  . Shortness of Breath     HISTORY OF PRESENT ILLNESS: Katherine Waters is a 70 y.o. female who presents for evaluation of aortic stenosis, referred by Dr Audie Box.   The patient is here with her husband today. She has had a heart murmur for several decades. She has developed progressive shortness of breath, reporting progression 'over the years.' She has longstanding shortness of breath, but more significant symptom progression over the past 6 months. She underwent extensive pulmonary testing last year in [place], [place]. It was felt that most likely aortic stenosis was causing her shortness of breath. States her lungs 'checked out fine.' She was followed by cardiology previously, and was told that her aortic stenosis wasn't bad enough for replacement yet. The patient has now established with Dr Audie Box who has evaluated her and feels she clearly has severe symptomatic aortic stenosis. Outside echo reports aortic valve Vmax of 4.1 m/s, mean gradient 41 mmHg, and AVA 0.88 square cm from September 2021. She underwent an updated echo study today.   The patient also reports 2 episodes of PND that occurred within the past few months. She is now short of breath with low level activity such as one flight of stairs or walking the dog. She also experiences a feeling in her central chest that she describes as a 'heart ache' with walking.   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 This has all been stable over recent years.   The patient has had regular dental care and reports no recent problems.  Past Medical History:  Diagnosis Date  . Aortic stenosis   . Asthma   . Connective tissue disease  ([place])   . Hyperlipidemia   . Hypertension     Current Outpatient Medications  Medication Sig Dispense Refill  . acetaminophen (TYLENOL) 325 MG tablet Take 325 mg by mouth every 6 (six) hours as needed.    Marland Kitchen amLODipine (NORVASC) 10 MG tablet Take 10 mg by mouth daily.    Marland Kitchen Apple Cider Vinegar 500 MG TABS Take 1 tablet by mouth daily.    . Cholecalciferol (VITAMIN D) 50 MCG (2000 UT) CAPS Take 1 capsule by mouth daily.    . DULoxetine (CYMBALTA) 60 MG capsule Take 60 mg by mouth daily.    . indapamide (LOZOL) 2.5 MG tablet Take 2.5 mg by mouth daily.    . Magnesium 100 MG CAPS Take 1 tablet by mouth daily.    . meloxicam (MOBIC) 15 MG tablet Once daily as needed for pain    . omeprazole (PRILOSEC) 40 MG capsule Take 40 mg by mouth daily.    . rosuvastatin (CRESTOR) 10 MG tablet Take 10 mg by mouth daily.    Marland Kitchen telmisartan (MICARDIS) 80 MG tablet Take 80 mg by mouth daily.    . Turmeric 500 MG CAPS Take 1 tablet by mouth daily.    . vitamin B-12 (CYANOCOBALAMIN) 500 MCG tablet Take 500 mcg by mouth daily.     No current facility-administered medications for this visit.    ALLERGIES:   Latex and Sulfa antibiotics   SOCIAL HISTORY:  The patient  reports that she quit smoking about 46  years ago. She has a 3.00 pack-year smoking history. She has never used smokeless tobacco. She reports that she does not drink alcohol and does not use drugs.   FAMILY HISTORY:  The patient's family history includes COPD in her mother; Cancer in her sister; Cirrhosis in her brother; Heart attack (age of onset: 46) in her father; Heart disease in her brother.   REVIEW OF SYSTEMS:  Positive for arthritis in her hands and feet, Raynaud's, connective tissue disease.   All other systems are reviewed and negative.   PHYSICAL EXAM: VS:  BP 136/70   Pulse 77   Ht 5' 2.75" (1.594 m)   Wt 220 lb (99.8 kg)   SpO2 96%   BMI 39.28 kg/m  , BMI Body mass index is 39.28 kg/m. GEN: Well nourished, well developed, in no  acute distress HEENT: normal Neck: No JVD. carotids 2+ without bruits or masses Cardiac: The heart is RRR with a harsh 3/6 late peaking systolic murmur at the RUSB. No edema. Pedal pulses 2+ = bilaterally  Respiratory:  clear to auscultation bilaterally GI: soft, nontender, nondistended, + BS MS: no deformity or atrophy Skin: warm and dry, no rash Neuro:  Strength and sensation are intact Psych: euthymic mood, full affect  EKG:  EKG from 01/10/21 reviewed and demonstrates NSR, possible LAE, NSSTT changes  RECENT LABS: 01/10/2021: BUN 22; Creatinine, Ser 0.80; Hemoglobin 14.1; Platelets 274; Potassium 4.1; Sodium 141; TSH 1.160  01/10/2021: Chol/HDL Ratio 2.3; Cholesterol, Total 163; HDL 70; LDL Chol Calc (NIH) 75; Triglycerides 98   Estimated Creatinine Clearance: 73.3 mL/min (by C-G formula based on SCr of 0.8 mg/dL).   Wt Readings from Last 3 Encounters:  01/15/21 220 lb (99.8 kg)  01/10/21 222 lb (100.7 kg)     CARDIAC STUDIES:  Echo:  IMPRESSIONS    1. Very severe aortic valve stenosis. DI is 0.26, impacted by  hyperdynamic state and high flow velocities. The aortic valve is abnormal.  There is severe calcifcation of the aortic valve. Aortic valve  regurgitation is trivial. Very severe aortic valve  stenosis. Aortic valve area, by VTI measures 0.81 cm. Aortic valve mean  gradient measures 69.0 mmHg from right sternal border. Aortic valve Vmax  measures 5.30 m/s.  2. Left ventricular ejection fraction, by estimation, is 65 to 70%. The  left ventricle has normal function. The left ventricle has no regional  wall motion abnormalities. There is moderate asymmetric left ventricular  hypertrophy of the basal-septal and  and mild LVH in other segments. Left ventricular diastolic parameters are  consistent with Grade II diastolic dysfunction (pseudonormalization).  Elevated left atrial pressure. The average left ventricular global  longitudinal strain is 18.8 %. The global   longitudinal strain is normal.  3. Right ventricular systolic function is normal. The right ventricular  size is normal. There is normal pulmonary artery systolic pressure. The  estimated right ventricular systolic pressure is 08.6 mmHg.  4. Left atrial size was severely dilated.  5. Degenerative mitral valve with mild-moderate mitral annular  calcification. DVI 0.51, MVA by continuity 1.7 cm2, however LVOT TVI  elevated, which may overestimate DVI and valve area due to hyperdynamic  state and high flow velocities. The mitral valve  is degenerative. Mild mitral valve regurgitation. Mild to moderate mitral  stenosis. The mean mitral valve gradient is 6.0 mmHg with average heart  rate of 77 bpm.  6. The inferior vena cava is normal in size with <50% respiratory  variability, suggesting right atrial pressure  of 8 mmHg.    Cardiac Cath: pending  STS RISK CALCULATOR: Isolated AVR: Risk of Mortality: 1.983% Renal Failure: 1.535% Permanent Stroke: 1.381% Prolonged Ventilation: 6.221% DSW Infection: 0.215% Reoperation: 3.024% Morbidity or Mortality: 10.906% Short Length of Stay: 39.019% Long Length of Stay: 4.217%  ASSESSMENT AND PLAN: 72.  70 year old woman with severe, stage D1 aortic stenosis.  This is associated with progressive New York Heart Association functional Rohleder III symptoms of acute on chronic diastolic heart failure/exertional dyspnea.  I have reviewed the natural history of aortic stenosis with the patient and their family members who are present today. We have discussed the limitations of medical therapy and the poor prognosis associated with symptomatic aortic stenosis. We have reviewed potential treatment options, including palliative medical therapy, conventional surgical aortic valve replacement, and transcatheter aortic valve replacement. We discussed treatment options in the context of the patient's specific comorbid medical conditions.  I personally  reviewed her echo images today.  She has vigorous LV systolic function with no regional wall motion abnormalities.  There is severe calcification and restriction of the aortic valve leaflets.  Doppler data suggest very severe aortic stenosis with a mean gradient of 69 mmHg and peak systolic velocity of 5.3 m/s.  Calculated aortic valve area 0.8 cm.  We discussed further evaluation and which will require right and left heart catheterization.  The patient had a catheterization a few years ago that demonstrated no obstructive CAD per her report.  She understands that we need to evaluate her hemodynamics and reassess coronary anatomy.  Fortunately she is not having any anginal symptoms.  I have reviewed the risks, indications, and alternatives to cardiac catheterization, possible angioplasty, and stenting with the patient. Risks include but are not limited to bleeding, infection, vascular injury, stroke, myocardial infection, arrhythmia, kidney injury, radiation-related injury in the case of prolonged fluoroscopy use, emergency cardiac surgery, and death. The patient understands the risks of serious complication is 1-2 in 2992 with diagnostic cardiac cath and 1-2% or less with angioplasty/stenting. We will also perform CTA studies of the heart as well as the chest, abdomen, and pelvis.  Will determine anatomic suitability for TAVR based on cardiac catheterization and CTA data.  She would likely be a reasonable candidate for either conventional surgery or TAVR based on her age and comorbid medical problems.  Once all studies are completed, the patient will be referred for formal cardiac surgical consultation as part of a multidisciplinary team approach to her care.  Deatra James 01/16/2021 11:37 AM     [facility] [address] [place] [address]  [phone] (office) [phone] (fax)

## 2021-01-15 NOTE — Patient Instructions (Addendum)
COVID SCREENING INFORMATION (4/11): You are scheduled for your drive-thru COVID screening on: 4/11 between 9AM and 10AM Pre-Procedural COVID-19 Testing Site [address] [place], [place] [address] You will need to go home after your screening and quarantine until your procedure.   CATHETERIZATION INSTRUCTIONS (4/12): You are scheduled for a Cardiac Catheterization on: 4/12 with Dr. Daneen Schick.  1. Please arrive at the [facility] (Main Entrance A) at [facility]: [address] [place], [place] [address] at: 7:00AM (This time is two hours before your procedure to ensure your preparation). Free valet parking service is available. You are allowed ONE visitor in the waiting room during your procedure. Both you and your guest must wear masks. Special note: Every effort is made to have your procedure done on time. Please understand that emergencies sometimes delay scheduled procedures.  2. Diet: Do not eat solid foods after midnight.  You may have clear liquids until 5am upon the day of the procedure.  3. Medication instructions in preparation for your procedure:  1) TAKE ASPIRIN 81 mg the morning of your cath  2) You may take your other medications as directed with sips of water   4. Plan for one night stay--bring personal belongings. 5. Bring a current list of your medications and current insurance cards. 6. You MUST have a responsible person to drive you home. 7. Someone MUST be with you the first 24 hours after you arrive home or your discharge will be delayed. 8. Please wear clothes that are easy to get on and off and wear slip-on shoes.  Thank you for allowing [place] to care for you!   -- [facility]

## 2021-01-16 ENCOUNTER — Encounter: Payer: Self-pay | Admitting: Cardiovascular Disease

## 2021-01-16 ENCOUNTER — Other Ambulatory Visit: Payer: Self-pay

## 2021-01-16 MED ORDER — METOPROLOL TARTRATE 50 MG PO TABS: ORAL_TABLET | ORAL | 0 refills | Status: DC

## 2021-01-28 ENCOUNTER — Other Ambulatory Visit (HOSPITAL_COMMUNITY)
Admission: RE | Admit: 2021-01-28 | Discharge: 2021-01-28 | Disposition: A | Discharge status: Home / Self Care | Payer: Medicare Other | Source: Ambulatory Visit | Attending: Interventional Cardiology | Admitting: Interventional Cardiology

## 2021-01-28 ENCOUNTER — Telehealth: Payer: Self-pay | Admitting: *Deleted

## 2021-01-28 DIAGNOSIS — Z20822 Contact with and (suspected) exposure to covid-19: Secondary | ICD-10-CM | POA: Diagnosis not present

## 2021-01-28 DIAGNOSIS — Z01812 Encounter for preprocedural laboratory examination: Secondary | ICD-10-CM | POA: Diagnosis present

## 2021-01-28 LAB — SARS CORONAVIRUS 2 (TAT 6-24 HRS): SARS Coronavirus 2: NEGATIVE

## 2021-01-28 NOTE — Telephone Encounter (Signed)
Pt contacted pre-catheterization scheduled at [facility] for: Tuesday January 29, 2021 9 AM Verified arrival time and place: [place] [facility]) at: 7 AM   No solid food after midnight prior to cath, clear liquids until 5 AM day of procedure.  AM meds can be  taken pre-cath with sips of water including: ASA 81 mg   Confirmed patient has responsible adult to drive home post procedure and be with patient first 24 hours after arriving home: yes  You are allowed ONE visitor in the waiting room during the time you are at the hospital for your procedure. Both you and your visitor must wear a mask once you enter the hospital.    Reviewed procedure/mask/visitor instructions with patient.

## 2021-01-28 NOTE — H&P (Signed)
Severe aortic stenosis with PND W/U for valve therapy either TAVR or SAVR

## 2021-01-29 ENCOUNTER — Ambulatory Visit (HOSPITAL_COMMUNITY)
Admission: RE | Admit: 2021-01-29 | Discharge: 2021-01-29 | Disposition: A | Discharge status: Home / Self Care | Payer: Medicare Other | Attending: Interventional Cardiology | Admitting: Interventional Cardiology

## 2021-01-29 ENCOUNTER — Encounter (HOSPITAL_COMMUNITY)
Admission: RE | Disposition: A | Discharge status: Home / Self Care | Payer: Self-pay | Source: Home / Self Care | Attending: Interventional Cardiology

## 2021-01-29 DIAGNOSIS — Z882 Allergy status to sulfonamides status: Secondary | ICD-10-CM | POA: Diagnosis not present

## 2021-01-29 DIAGNOSIS — Z79899 Other long term (current) drug therapy: Secondary | ICD-10-CM | POA: Diagnosis not present

## 2021-01-29 DIAGNOSIS — Z87891 Personal history of nicotine dependence: Secondary | ICD-10-CM | POA: Insufficient documentation

## 2021-01-29 DIAGNOSIS — I350 Nonrheumatic aortic (valve) stenosis: Secondary | ICD-10-CM | POA: Diagnosis present

## 2021-01-29 DIAGNOSIS — I110 Hypertensive heart disease with heart failure: Secondary | ICD-10-CM | POA: Insufficient documentation

## 2021-01-29 DIAGNOSIS — E785 Hyperlipidemia, unspecified: Secondary | ICD-10-CM | POA: Diagnosis not present

## 2021-01-29 DIAGNOSIS — I5032 Chronic diastolic (congestive) heart failure: Secondary | ICD-10-CM | POA: Insufficient documentation

## 2021-01-29 DIAGNOSIS — Z8249 Family history of ischemic heart disease and other diseases of the circulatory system: Secondary | ICD-10-CM | POA: Insufficient documentation

## 2021-01-29 DIAGNOSIS — Z91040 Latex allergy status: Secondary | ICD-10-CM | POA: Insufficient documentation

## 2021-01-29 HISTORY — PX: RIGHT/LEFT HEART CATH AND CORONARY ANGIOGRAPHY: CATH118266

## 2021-01-29 LAB — POCT I-STAT EG7
Acid-Base Excess: 4 mmol/L — ABNORMAL HIGH (ref 0.0–2.0)
Acid-Base Excess: 4 mmol/L — ABNORMAL HIGH (ref 0.0–2.0)
Bicarbonate: 30.3 mmol/L — ABNORMAL HIGH (ref 20.0–28.0)
Bicarbonate: 30.5 mmol/L — ABNORMAL HIGH (ref 20.0–28.0)
Calcium, Ion: 1.23 mmol/L (ref 1.15–1.40)
Calcium, Ion: 1.26 mmol/L (ref 1.15–1.40)
HCT: 39 % (ref 36.0–46.0)
HCT: 39 % (ref 36.0–46.0)
Hemoglobin: 13.3 g/dL (ref 12.0–15.0)
Hemoglobin: 13.3 g/dL (ref 12.0–15.0)
O2 Saturation: 80 %
O2 Saturation: 80 %
Potassium: 3.7 mmol/L (ref 3.5–5.1)
Potassium: 3.7 mmol/L (ref 3.5–5.1)
Sodium: 140 mmol/L (ref 135–145)
Sodium: 140 mmol/L (ref 135–145)
TCO2: 32 mmol/L (ref 22–32)
TCO2: 32 mmol/L (ref 22–32)
pCO2, Ven: 50.4 mmHg (ref 44.0–60.0)
pCO2, Ven: 50.5 mmHg (ref 44.0–60.0)
pH, Ven: 7.387 (ref 7.250–7.430)
pH, Ven: 7.39 (ref 7.250–7.430)
pO2, Ven: 45 mmHg (ref 32.0–45.0)
pO2, Ven: 46 mmHg — ABNORMAL HIGH (ref 32.0–45.0)

## 2021-01-29 LAB — POCT I-STAT 7, (LYTES, BLD GAS, ICA,H+H)
Acid-Base Excess: 4 mmol/L — ABNORMAL HIGH (ref 0.0–2.0)
Bicarbonate: 29.7 mmol/L — ABNORMAL HIGH (ref 20.0–28.0)
Calcium, Ion: 1.25 mmol/L (ref 1.15–1.40)
HCT: 39 % (ref 36.0–46.0)
Hemoglobin: 13.3 g/dL (ref 12.0–15.0)
O2 Saturation: 98 %
Potassium: 3.7 mmol/L (ref 3.5–5.1)
Sodium: 141 mmol/L (ref 135–145)
TCO2: 31 mmol/L (ref 22–32)
pCO2 arterial: 48 mmHg (ref 32.0–48.0)
pH, Arterial: 7.399 (ref 7.350–7.450)
pO2, Arterial: 100 mmHg (ref 83.0–108.0)

## 2021-01-29 SURGERY — RIGHT/LEFT HEART CATH AND CORONARY ANGIOGRAPHY
Anesthesia: LOCAL

## 2021-01-29 MED ORDER — ACETAMINOPHEN 325 MG PO TABS: 650.0000 mg | ORAL_TABLET | ORAL | Status: DC | PRN

## 2021-01-29 MED ORDER — VERAPAMIL HCL 2.5 MG/ML IV SOLN
INTRAVENOUS | Status: AC
  Filled 2021-01-29: qty 2

## 2021-01-29 MED ORDER — ONDANSETRON HCL 4 MG/2ML IJ SOLN: 4.0000 mg | Freq: Four times a day (QID) | INTRAMUSCULAR | Status: DC | PRN

## 2021-01-29 MED ORDER — SODIUM CHLORIDE 0.9 % WEIGHT BASED INFUSION: 1.0000 mL/kg/h | INTRAVENOUS | Status: DC

## 2021-01-29 MED ORDER — FENTANYL CITRATE (PF) 100 MCG/2ML IJ SOLN
INTRAMUSCULAR | Status: DC | PRN
  Administered 2021-01-29: 12.5 ug via INTRAVENOUS
  Administered 2021-01-29: 25 ug via INTRAVENOUS

## 2021-01-29 MED ORDER — OXYCODONE HCL 5 MG PO TABS: 5.0000 mg | ORAL_TABLET | ORAL | Status: DC | PRN

## 2021-01-29 MED ORDER — SODIUM CHLORIDE 0.9% FLUSH: 3.0000 mL | Freq: Two times a day (BID) | INTRAVENOUS | Status: DC

## 2021-01-29 MED ORDER — SODIUM CHLORIDE 0.9 % IV SOLN: INTRAVENOUS | Status: DC

## 2021-01-29 MED ORDER — SODIUM CHLORIDE 0.9 % IV SOLN: 250.0000 mL | INTRAVENOUS | Status: DC | PRN

## 2021-01-29 MED ORDER — HEPARIN (PORCINE) IN NACL 1000-0.9 UT/500ML-% IV SOLN
INTRAVENOUS | Status: AC
  Filled 2021-01-29: qty 500

## 2021-01-29 MED ORDER — LIDOCAINE HCL (PF) 1 % IJ SOLN
INTRAMUSCULAR | Status: AC
  Filled 2021-01-29: qty 30

## 2021-01-29 MED ORDER — SODIUM CHLORIDE 0.9% FLUSH: 3.0000 mL | INTRAVENOUS | Status: DC | PRN

## 2021-01-29 MED ORDER — HYDRALAZINE HCL 20 MG/ML IJ SOLN: 10.0000 mg | INTRAMUSCULAR | Status: DC | PRN

## 2021-01-29 MED ORDER — VERAPAMIL HCL 2.5 MG/ML IV SOLN
INTRAVENOUS | Status: DC | PRN
  Administered 2021-01-29: 10 mL via INTRA_ARTERIAL

## 2021-01-29 MED ORDER — HEPARIN (PORCINE) IN NACL 1000-0.9 UT/500ML-% IV SOLN
INTRAVENOUS | Status: DC | PRN
  Administered 2021-01-29 (×2): 500 mL

## 2021-01-29 MED ORDER — LABETALOL HCL 5 MG/ML IV SOLN: 10.0000 mg | INTRAVENOUS | Status: DC | PRN

## 2021-01-29 MED ORDER — MIDAZOLAM HCL 2 MG/2ML IJ SOLN
INTRAMUSCULAR | Status: AC
  Filled 2021-01-29: qty 2

## 2021-01-29 MED ORDER — MIDAZOLAM HCL 2 MG/2ML IJ SOLN
INTRAMUSCULAR | Status: DC | PRN
  Administered 2021-01-29: 1 mg via INTRAVENOUS
  Administered 2021-01-29: 0.5 mg via INTRAVENOUS

## 2021-01-29 MED ORDER — SODIUM CHLORIDE 0.9 % WEIGHT BASED INFUSION
3.0000 mL/kg/h | INTRAVENOUS | Status: AC
  Administered 2021-01-29: 3 mL/kg/h via INTRAVENOUS

## 2021-01-29 MED ORDER — LIDOCAINE HCL (PF) 1 % IJ SOLN
INTRAMUSCULAR | Status: DC | PRN
  Administered 2021-01-29: 2 mL
  Administered 2021-01-29: 5 mL

## 2021-01-29 MED ORDER — HEPARIN SODIUM (PORCINE) 1000 UNIT/ML IJ SOLN
INTRAMUSCULAR | Status: DC | PRN
  Administered 2021-01-29: 5000 [IU] via INTRAVENOUS

## 2021-01-29 MED ORDER — ASPIRIN 81 MG PO CHEW: 81.0000 mg | CHEWABLE_TABLET | ORAL | Status: DC

## 2021-01-29 MED ORDER — FENTANYL CITRATE (PF) 100 MCG/2ML IJ SOLN
INTRAMUSCULAR | Status: AC
  Filled 2021-01-29: qty 2

## 2021-01-29 MED ORDER — ASPIRIN 81 MG PO CHEW: 81.0000 mg | CHEWABLE_TABLET | Freq: Every day | ORAL | Status: DC

## 2021-01-29 MED ORDER — IOHEXOL 350 MG/ML SOLN
INTRAVENOUS | Status: DC | PRN
  Administered 2021-01-29: 45 mL

## 2021-01-29 SURGICAL SUPPLY — 13 items
CATH 5FR JL3.5 JR4 ANG PIG MP (CATHETERS) ×2 IMPLANT
CATH SWAN GANZ 7F STRAIGHT (CATHETERS) ×2 IMPLANT
DEVICE RAD COMP TR BAND LRG (VASCULAR PRODUCTS) ×2 IMPLANT
GLIDESHEATH SLEND A-KIT 6F 22G (SHEATH) ×2 IMPLANT
GLIDESHEATH SLENDER 7FR .021G (SHEATH) ×2 IMPLANT
GUIDEWIRE .025 260CM (WIRE) ×2 IMPLANT
GUIDEWIRE INQWIRE 1.5J.035X260 (WIRE) ×1 IMPLANT
INQWIRE 1.5J .035X260CM (WIRE) ×2
KIT HEART LEFT (KITS) ×2 IMPLANT
PACK CARDIAC CATHETERIZATION (CUSTOM PROCEDURE TRAY) ×2 IMPLANT
TRANSDUCER W/STOPCOCK (MISCELLANEOUS) ×2 IMPLANT
TUBING CIL FLEX 10 FLL-RA (TUBING) ×2 IMPLANT
WIRE EMERALD ST .035X150CM (WIRE) ×2 IMPLANT

## 2021-01-29 NOTE — Progress Notes (Signed)
Pt ambulated without difficulty or bleeding.   Discharged home with husband who will drive and stay with pt x 24 hrs. Arm board placed on pt.

## 2021-01-29 NOTE — Interval H&P Note (Signed)
Cath Lab Visit (complete for each Cath Lab visit)  Clinical Evaluation Leading to the Procedure:   ACS: No.  Non-ACS:    Anginal Classification: CCS II  Anti-ischemic medical therapy: Minimal Therapy (1 [place] of medications)  Non-Invasive Test Results: No non-invasive testing performed  Prior CABG: No previous CABG      History and Physical Interval Note:  01/29/2021 9:19 AM  Katherine Waters  has presented today for surgery, with the diagnosis of aortic stenosis.  The various methods of treatment have been discussed with the patient and family. After consideration of risks, benefits and other options for treatment, the patient has consented to  Procedure(s): RIGHT/LEFT HEART CATH AND CORONARY ANGIOGRAPHY (N/A) as a surgical intervention.  The patient's history has been reviewed, patient examined, no change in status, stable for surgery.  I have reviewed the patient's chart and labs.  Questions were answered to the patient's satisfaction.     Belva Crome III

## 2021-01-29 NOTE — CV Procedure (Signed)
   Unable to record gradient across the valve. Upon guidewire removal, the catheter would eject into the aorta.  The EDP is 19 mmHg.  Systolic pressure greater than > 200 mmHg.  Normal coronary arteries.  Mean pulmonary artery pressure 34 mmHg,  Mean capillary wedge pressure 22 mmHg.  Cardiac output:

## 2021-01-29 NOTE — Discharge Instructions (Signed)
Radial Site Care  This sheet gives you information about how to care for yourself after your procedure. Your health care provider may also give you more specific instructions. If you have problems or questions, contact your health care provider. What can I expect after the procedure? After the procedure, it is common to have:  Bruising and tenderness at the catheter insertion area. Follow these instructions at home: Medicines  Take over-the-counter and prescription medicines only as told by your health care provider. Insertion site care 1. Follow instructions from your health care provider about how to take care of your insertion site. Make sure you: ? Wash your hands with soap and water before you remove your bandage (dressing). If soap and water are not available, use hand sanitizer. ? May remove dressing in 24 hours. 2. Check your insertion site every day for signs of infection. Check for: ? Redness, swelling, or pain. ? Fluid or blood. ? Pus or a bad smell. ? Warmth. 3. Do no take baths, swim, or use a hot tub for 5 days. 4. You may shower 24-48 hours after the procedure. ? Remove the dressing and gently wash the site with plain soap and water. ? Pat the area dry with a clean towel. ? Do not rub the site. That could cause bleeding. 5. Do not apply powder or lotion to the site. Activity  1. For 24 hours after the procedure, or as directed by your health care provider: ? Do not flex or bend the affected arm. ? Do not push or pull heavy objects with the affected arm. ? Do not drive yourself home from the hospital or clinic. You may drive 24 hours after the procedure. ? Do not operate machinery or power tools. ? KEEP ARM ELEVATED THE REMAINDER OF THE DAY. 2. Do not push, pull or lift anything that is heavier than 10 lb for 5 days. 3. Ask your health care provider when it is okay to: ? Return to work or school. ? Resume usual physical activities or sports. ? Resume sexual  activity. General instructions  If the catheter site starts to bleed, raise your arm and put firm pressure on the site. If the bleeding does not stop, get help right away. This is a medical emergency.  DRINK PLENTY OF FLUIDS FOR THE NEXT 2-3 DAYS.  No alcohol consumption for 24 hours after receiving sedation.  If you went home on the same day as your procedure, a responsible adult should be with you for the first 24 hours after you arrive home.  Keep all follow-up visits as told by your health care provider. This is important. Contact a health care provider if:  You have a fever.  You have redness, swelling, or yellow drainage around your insertion site. Get help right away if:  You have unusual pain at the radial site.  The catheter insertion area swells very fast.  The insertion area is bleeding, and the bleeding does not stop when you hold steady pressure on the area.  Your arm or hand becomes pale, cool, tingly, or numb. These symptoms may represent a serious problem that is an emergency. Do not wait to see if the symptoms will go away. Get medical help right away. Call your local emergency services (911 in the [place] Do not drive yourself to the hospital. Summary  After the procedure, it is common to have bruising and tenderness at the site.  Follow instructions from your health care provider about how to take care   of your radial site wound. Check the wound every day for signs of infection.  This information is not intended to replace advice given to you by your health care provider. Make sure you discuss any questions you have with your health care provider. Document Revised: 11/11/2017 Document Reviewed: 11/11/2017 Elsevier Patient Education  2020 [employer] 

## 2021-01-30 ENCOUNTER — Encounter (HOSPITAL_COMMUNITY): Payer: Self-pay | Admitting: Interventional Cardiology

## 2021-01-31 ENCOUNTER — Encounter: Payer: Self-pay | Admitting: Physician Assistant

## 2021-01-31 ENCOUNTER — Telehealth (HOSPITAL_COMMUNITY): Payer: Self-pay | Admitting: Emergency Medicine

## 2021-01-31 DIAGNOSIS — I10 Essential (primary) hypertension: Secondary | ICD-10-CM | POA: Insufficient documentation

## 2021-01-31 DIAGNOSIS — J45909 Unspecified asthma, uncomplicated: Secondary | ICD-10-CM | POA: Insufficient documentation

## 2021-01-31 DIAGNOSIS — I350 Nonrheumatic aortic (valve) stenosis: Secondary | ICD-10-CM | POA: Insufficient documentation

## 2021-01-31 DIAGNOSIS — M359 Systemic involvement of connective tissue, unspecified: Secondary | ICD-10-CM | POA: Insufficient documentation

## 2021-01-31 DIAGNOSIS — E785 Hyperlipidemia, unspecified: Secondary | ICD-10-CM | POA: Insufficient documentation

## 2021-01-31 NOTE — Telephone Encounter (Signed)
Reaching out to patient to offer assistance regarding upcoming cardiac imaging study; pt verbalizes understanding of appt date/time, parking situation and where to check in, pre-test NPO status and medications ordered, and verified current allergies; name and call back number provided for further questions should they arise Jaggar Benko RN Navigator Cardiac Imaging [facility] [phone] office [phone] cell  50mg metoprolol tartrate  

## 2021-01-31 NOTE — Telephone Encounter (Signed)
Attempted to call patient regarding upcoming cardiac CT appointment. °Left message on voicemail with name and callback number °Manette Doto RN Navigator Cardiac Imaging °[facility] °[phone] Office °[phone] Cell ° °

## 2021-02-01 ENCOUNTER — Ambulatory Visit (HOSPITAL_COMMUNITY)
Admission: RE | Admit: 2021-02-01 | Discharge: 2021-02-01 | Disposition: A | Discharge status: Home / Self Care | Payer: Medicare Other | Source: Ambulatory Visit | Attending: Cardiovascular Disease | Admitting: Cardiovascular Disease

## 2021-02-01 ENCOUNTER — Other Ambulatory Visit: Payer: Self-pay

## 2021-02-01 ENCOUNTER — Ambulatory Visit (HOSPITAL_COMMUNITY): Payer: Medicare Other

## 2021-02-01 DIAGNOSIS — I350 Nonrheumatic aortic (valve) stenosis: Secondary | ICD-10-CM

## 2021-02-01 MED ORDER — IOHEXOL 350 MG/ML SOLN
100.0000 mL | Freq: Once | INTRAVENOUS | Status: AC | PRN
  Administered 2021-02-01: 100 mL via INTRAVENOUS

## 2021-02-01 NOTE — Progress Notes (Signed)
Carotid duplex has been completed.   Preliminary results in CV Proc.   [name] 02/01/2021 8:58 AM

## 2021-02-04 ENCOUNTER — Encounter: Payer: Self-pay | Admitting: Physical Therapy

## 2021-02-04 ENCOUNTER — Institutional Professional Consult (permissible substitution) (INDEPENDENT_AMBULATORY_CARE_PROVIDER_SITE_OTHER): Payer: Medicare Other | Admitting: Thoracic Surgery (Cardiothoracic Vascular Surgery)

## 2021-02-04 ENCOUNTER — Encounter: Payer: Self-pay | Admitting: Thoracic Surgery (Cardiothoracic Vascular Surgery)

## 2021-02-04 ENCOUNTER — Ambulatory Visit: Payer: Medicare Other | Attending: Cardiovascular Disease | Admitting: Physical Therapy

## 2021-02-04 ENCOUNTER — Other Ambulatory Visit: Payer: Self-pay

## 2021-02-04 VITALS — BP 138/70 | HR 77 | Temp 97.9°F | Resp 20 | Ht 62.75 in | Wt 220.0 lb

## 2021-02-04 DIAGNOSIS — R2689 Other abnormalities of gait and mobility: Secondary | ICD-10-CM

## 2021-02-04 DIAGNOSIS — I350 Nonrheumatic aortic (valve) stenosis: Secondary | ICD-10-CM | POA: Diagnosis not present

## 2021-02-04 NOTE — H&P (View-Only) (Signed)
[facility]  CARDIOTHORACIC SURGERY CONSULTATION REPORT  Referring Provider is O'Neal, Cassie Freer, MD PCP is Scifres, Earlie Server, PA-C  Chief Complaint  Patient presents with  . Aortic Stenosis    New patient consultation, review all studies    HPI:  Patient is 70 year old morbidly obese female with history of aortic stenosis, hypertension, hyperlipidemia, and mixed connective tissue disease who has been referred for surgical consultation to discuss treatment options for management of severe symptomatic aortic stenosis.  The patient states that she has noted the presence of a heart murmur for many years.  She was followed for several years by a cardiologist in [place] with known diagnosis of aortic stenosis that had gradually progressed in severity.  She also has a long history of dyspnea on exertion.  Symptoms have progressed fairly dramatically over the last few months to the point where recently the patient gets short of breath with very low level activity and occasionally at rest.  She recently moved from [place] to [facility] and establish care with Dr. Farris Has.  Routine follow-up transthoracic echocardiogram performed January 15, 2021 confirmed the presence of severe aortic stenosis.  Peak velocity across aortic valve measured at size 5.3 m/s corresponding to mean transvalvular gradient estimated 69 mmHg and aortic valve area calculated 0.81 cm by VTI.  The DVI was reported 0.26 with stroke-volume index 49.  Left ventricular systolic function was hyperdynamic with ejection fraction estimated 65 to 70%.  The patient was referred to the multidisciplinary heart valve clinic and has been evaluated previously by Dr. Burt Knack.  Diagnostic cardiac catheterization was performed January 29, 2021 and confirmed presence of severe aortic stenosis.  There was normal coronary artery anatomy with no flow-limiting coronary artery  disease.  Right heart pressures were mildly elevated.  CT angiography was performed and the patient referred for surgical consultation.  Patient is married and both she and her husband just recently moved from [place] to [place] to live close to their son.  They have just recently purchased a house in [place] which they plan to close on within the week.  The patient admits that she has been overweight for much of her adult life.  Last year she had lost 60 pounds in weight on a low-carb diet with frequent walking.  She has gained some of that back in the winter months as she was getting ready for the move and walking much less.  She has a longstanding history of exertional shortness of breath.  Symptoms have progressed dramatically over the past several months.  She now gets short of breath with low-level activity.  She cannot lay flat in bed.  She has had several episodes of PND.  She has had some dizziness without syncope.  Past Medical History:  Diagnosis Date  . Asthma   . Connective tissue disease ([place])   . Hyperlipidemia   . Hypertension   . Severe aortic stenosis     Past Surgical History:  Procedure Laterality Date  . CARDIAC CATHETERIZATION    . LUMBAR FUSION    . RIGHT/LEFT HEART CATH AND CORONARY ANGIOGRAPHY N/A 01/29/2021   Procedure: RIGHT/LEFT HEART CATH AND CORONARY ANGIOGRAPHY;  Surgeon: Belva Crome, MD;  Location: [facility];  Service: Cardiovascular;  Laterality: N/A;  . ROTATOR CUFF REPAIR      Family History  Problem Relation Age of Onset  . COPD Mother   . Heart attack Father 86  . Cancer  Sister   . Cirrhosis Brother   . Heart disease Brother     Social History   Socioeconomic History  . Marital status: Married    Spouse name: Not on file  . Number of children: 1  . Years of education: Not on file  . Highest education level: Not on file  Occupational History  . Occupation: retired bookkeeper  Tobacco Use  . Smoking status: Former  Smoker    Packs/day: 0.50    Years: 6.00    Pack years: 3.00    Quit date: 1976    Years since quitting: 46.3  . Smokeless tobacco: Never Used  Substance and Sexual Activity  . Alcohol use: Never  . Drug use: Never  . Sexual activity: Not on file  Other Topics Concern  . Not on file  Social History Narrative  . Not on file   Social Determinants of Health   Financial Resource Strain: Not on file  Food Insecurity: Not on file  Transportation Needs: Not on file  Physical Activity: Not on file  Stress: Not on file  Social Connections: Not on file  Intimate Partner Violence: Not on file    Current Outpatient Medications  Medication Sig Dispense Refill  . acetaminophen (TYLENOL) 500 MG tablet Take 1,000 mg by mouth 2 (two) times daily as needed for moderate pain (Takes 1000 mg every morning).    Marland Kitchen amLODipine (NORVASC) 10 MG tablet Take 10 mg by mouth daily.    . APPLE CIDER VINEGAR PO Take 1,500 mg by mouth daily.    Marland Kitchen Bioflavonoid Products (ESTER C PO) Take 1,000 mg by mouth daily.    . Cholecalciferol (VITAMIN D) 125 MCG (5000 UT) CAPS Take 5,000 Units by mouth daily.    . Cyanocobalamin (B-12) 1000 MCG SUBL Place 1,000 mcg under the tongue daily.    . DULoxetine (CYMBALTA) 60 MG capsule Take 60 mg by mouth daily.    . indapamide (LOZOL) 2.5 MG tablet Take 2.5 mg by mouth daily.    . Magnesium 100 MG CAPS Take 100 mg by mouth daily.    . meloxicam (MOBIC) 15 MG tablet Take 15 mg by mouth daily.    Marland Kitchen omeprazole (PRILOSEC) 40 MG capsule Take 40 mg by mouth daily.    . rosuvastatin (CRESTOR) 10 MG tablet Take 10 mg by mouth daily.    Marland Kitchen telmisartan (MICARDIS) 80 MG tablet Take 80 mg by mouth daily.    . TURMERIC PO Take 1,950 mg by mouth daily. With 300 mg Ginger     No current facility-administered medications for this visit.    Allergies  Allergen Reactions  . Dobutamine     Decreases heart rate   . Latex Rash  . Sulfa Antibiotics Rash      Review of  Systems:   General:  normal appetite, decreased energy, + weight gain, no weight loss, no fever  Cardiac:  no chest pain with exertion, no chest pain at rest, +SOB with low level exertion, occasional resting SOB, + PND, + orthopnea, no palpitations, no arrhythmia, no atrial fibrillation, no LE edema, + dizzy spells, no syncope  Respiratory:  + shortness of breath, no home oxygen, no productive cough, no dry cough, no bronchitis, + wheezing, no hemoptysis, + asthma, no pain with inspiration or cough, no sleep apnea, no CPAP at night  GI:   no difficulty swallowing, + reflux, no frequent heartburn, no hiatal hernia, no abdominal pain, no constipation, no diarrhea, no hematochezia, no  hematemesis, no melena  GU:   no dysuria,  no frequency, no urinary tract infection, no hematuria, no  kidney stones, no kidney disease  Vascular:  no pain suggestive of claudication, no pain in feet, no leg cramps, no varicose veins, no DVT, no non-healing foot ulcer  Neuro:   no stroke, no TIA's, no seizures, no headaches, no temporary blindness one eye,  no slurred speech, no peripheral neuropathy, no chronic pain, no instability of gait, no memory/cognitive dysfunction  Musculoskeletal: + arthritis, no joint swelling, no myalgias, no difficulty walking, normal mobility   Skin:   no rash, no itching, no skin infections, no pressure sores or ulcerations  Psych:   no anxiety, + depression, no nervousness, no unusual recent stress  Eyes:   no blurry vision, no floaters, no recent vision changes, + wears glasses or contacts  ENT:   no hearing loss, no loose or painful teeth, no dentures, last saw dentist 6 months ago  Hematologic:  no easy bruising, no abnormal bleeding, no clotting disorder, no frequent epistaxis  Endocrine:  no diabetes, does not check CBG's at home           Physical Exam:   BP 138/70 (BP Location: Left Arm, Patient Position: Sitting, Cuff Size: Large)   Pulse 77   Temp 97.9 F (36.6 C) (Skin)    Resp 20   Ht 5' 2.75" (1.594 m)   Wt 220 lb (99.8 kg)   SpO2 94% Comment: RA  BMI 39.28 kg/m   General:  Obese,  well-appearing  HEENT:  Unremarkable   Neck:   no JVD, no bruits, no adenopathy   Chest:   clear to auscultation, symmetrical breath sounds, no wheezes, no rhonchi   CV:   RRR, grade III/VI crescendo/decrescendo murmur heard best at RUSB,  no diastolic murmur  Abdomen:  soft, non-tender, no masses   Extremities:  warm, well-perfused, pulses not palpable, no LE edema  Rectal/GU  Deferred  Neuro:   Grossly non-focal and symmetrical throughout  Skin:   Clean and dry, no rashes, no breakdown   Diagnostic Tests:  ECHOCARDIOGRAM REPORT       Patient Name:  Daune Honeyman  Date of Exam: 01/15/2021  Medical Rec #: [id number]   Height:    62.8 in  Accession #:  [id number]  Weight:    222.0 lb  Date of Birth: 25-Aug-1951   BSA:     2.016 m  Patient Age:  63 years   BP:      152/80 mmHg  Patient Gender: F       HR:      80 bpm.  Exam Location: [address]   Procedure: 2D Echo, 3D Echo, Cardiac Doppler, Color Doppler and Strain  Analysis   Indications:  I35.0 Aortic Stenosis    History:    Patient has no prior history of Echocardiogram  examinations.         Aortic Valve Disease, Signs/Symptoms:Shortness of Breath;  Risk         Factors:Family History of Coronary Artery Disease,  Hypertension,         Dyslipidemia and Former Smoker. Prior Studies done in  [place].    Sonographer:  Deliah Boston RDCS  Referring Phys: [id number] [place]    1. Very severe aortic valve stenosis. DI is 0.26, impacted by  hyperdynamic state and high flow velocities. The aortic valve is abnormal.  There is severe calcifcation of the aortic valve. Aortic  valve  regurgitation is trivial. Very severe aortic valve  stenosis. Aortic valve area, by VTI measures 0.81 cm. Aortic  valve mean  gradient measures 69.0 mmHg from right sternal border. Aortic valve Vmax  measures 5.30 m/s.  2. Left ventricular ejection fraction, by estimation, is 65 to 70%. The  left ventricle has normal function. The left ventricle has no regional  wall motion abnormalities. There is moderate asymmetric left ventricular  hypertrophy of the basal-septal and  and mild LVH in other segments. Left ventricular diastolic parameters are  consistent with Grade II diastolic dysfunction (pseudonormalization).  Elevated left atrial pressure. The average left ventricular global  longitudinal strain is 18.8 %. The global  longitudinal strain is normal.  3. Right ventricular systolic function is normal. The right ventricular  size is normal. There is normal pulmonary artery systolic pressure. The  estimated right ventricular systolic pressure is 16.1 mmHg.  4. Left atrial size was severely dilated.  5. Degenerative mitral valve with mild-moderate mitral annular  calcification. DVI 0.51, MVA by continuity 1.7 cm2, however LVOT TVI  elevated, which may overestimate DVI and valve area due to hyperdynamic  state and high flow velocities. The mitral valve  is degenerative. Mild mitral valve regurgitation. Mild to moderate mitral  stenosis. The mean mitral valve gradient is 6.0 mmHg with average heart  rate of 77 bpm.  6. The inferior vena cava is normal in size with <50% respiratory  variability, suggesting right atrial pressure of 8 mmHg.   FINDINGS  Left Ventricle: Left ventricular ejection fraction, by estimation, is 65  to 70%. The left ventricle has normal function. The left ventricle has no  regional wall motion abnormalities. The average left ventricular global  longitudinal strain is 18.8 %. The  global longitudinal strain is normal. The left ventricular internal  cavity size was normal in size. There is moderate asymmetric left  ventricular hypertrophy of the basal-septal and and mild  LVH in other  segments. Left ventricular diastolic parameters  are consistent with Grade II diastolic dysfunction (pseudonormalization).  Elevated left atrial pressure.   Right Ventricle: The right ventricular size is normal. No increase in  right ventricular wall thickness. Right ventricular systolic function is  normal. There is normal pulmonary artery systolic pressure. The tricuspid  regurgitant velocity is 2.53 m/s, and  with an assumed right atrial pressure of 8 mmHg, the estimated right  ventricular systolic pressure is 09.6 mmHg.   Left Atrium: Left atrial size was severely dilated.   Right Atrium: Right atrial size was normal in size.   Pericardium: There is no evidence of pericardial effusion.   Mitral Valve: Degenerative mitral valve with mild-moderate mitral annular  calcification. DVI 0.51, MVA by continuity 1.7 cm2, however LVOT TVI  elevated, which may overestimate DVI and valve area due to hyperdynamic  state and high flow velocities. The  mitral valve is degenerative in appearance. Mild to moderate mitral  annular calcification. Mild mitral valve regurgitation. Mild to moderate  mitral valve stenosis. MV peak gradient, 12.7 mmHg. The mean mitral valve  gradient is 6.0 mmHg with average heart  rate of 77 bpm.   Tricuspid Valve: The tricuspid valve is normal in structure. Tricuspid  valve regurgitation is trivial. No evidence of tricuspid stenosis.   Aortic Valve: Very severe aortic valve stenosis. DI is 0.26, impacted by  hyperdynamic state and high flow velocities. The aortic valve is abnormal.  There is severe calcifcation of the aortic valve. Aortic valve  regurgitation is trivial.  Aortic  regurgitation PHT measures 382 msec. Very severe aortic valve stenosis.  Aortic valve mean gradient measures 69.0 mmHg. Aortic valve peak gradient  measures 112.4 mmHg. Aortic valve area, by VTI measures 0.81 cm.   Pulmonic Valve: The pulmonic valve was not well  visualized. Pulmonic valve  regurgitation is trivial. No evidence of pulmonic stenosis.   Aorta: The aortic root is normal in size and structure.   Venous: The inferior vena cava is normal in size with less than 50%  respiratory variability, suggesting right atrial pressure of 8 mmHg.   IAS/Shunts: No atrial level shunt detected by color flow Doppler.     LEFT VENTRICLE  PLAX 2D  LVIDd:     4.60 cm Diastology  LVIDs:     2.50 cm LV e' medial:  5.44 cm/s  LV PW:     1.10 cm LV E/e' medial: 30.2  LV IVS:    1.40 cm LV e' lateral:  4.57 cm/s  LVOT diam:   2.00 cm LV E/e' lateral: 36.0  LV SV:     99  LV SV Index:  49    2D Longitudinal Strain  LVOT Area:   3.14 cm 2D Strain GLS (A2C):  18.3 %             2D Strain GLS (A3C):  20.3 %             2D Strain GLS (A4C):  17.8 %             2D Strain GLS Avg:   18.8 %               3D Volume EF:             3D EF:    61 %             LV EDV:    154 ml             LV ESV:    60 ml             LV SV:    94 ml   RIGHT VENTRICLE  RV S prime:   17.30 cm/s  TAPSE (M-mode): 3.1 cm   LEFT ATRIUM       Index    RIGHT ATRIUM      Index  LA diam:    5.20 cm 2.58 cm/m RA Area:   12.90 cm  LA Vol (A2C):  133.0 ml 65.98 ml/m RA Volume:  27.20 ml 13.49 ml/m  LA Vol (A4C):  72.0 ml 35.72 ml/m  LA Biplane Vol: 102.0 ml 50.60 ml/m  AORTIC VALVE  AV Area (Vmax):  0.82 cm  AV Area (Vmean):  0.91 cm  AV Area (VTI):   0.81 cm  AV Vmax:      530.00 cm/s  AV Vmean:     345.000 cm/s  AV VTI:      1.221 m  AV Peak Grad:   112.4 mmHg  AV Mean Grad:   69.0 mmHg  LVOT Vmax:     139.00 cm/s  LVOT Vmean:    99.650 cm/s  LVOT VTI:     0.316 m  LVOT/AV VTI ratio: 0.26  AI PHT:      382 msec    AORTA  Ao  Root diam: 3.10 cm  Ao Asc diam: 3.70 cm   MITRAL VALVE        TRICUSPID VALVE  MV Area (PHT): cm  TR Peak grad:  25.6 mmHg  MV Peak grad: 12.7 mmHg  TR Vmax:    253.00 cm/s  MV Mean grad: 6.0 mmHg  MV Vmax:    1.78 m/s   SHUNTS  MV Vmean:   101.6 cm/s  Systemic VTI: 0.32 m  MV Decel Time: 302 msec   Systemic Diam: 2.00 cm  MV E velocity: 164.50 cm/s  MV A velocity: 168.50 cm/s  MV E/A ratio: 0.98   Cherlynn Kaiser MD  Electronically signed by Cherlynn Kaiser MD  Signature Date/Time: 01/15/2021/11:30:02 PM      RIGHT/LEFT HEART CATH AND CORONARY ANGIOGRAPHY    Conclusion   Fluoroscopy demonstrated severe aortic valve calcification with minimal movement and heavily calcified mitral annulus.  Right dominant coronary anatomy with widely patent normal coronary arteries.  Severe calcific aortic stenosis.  (Greater than 70 mmHg peak gradient, but due to catheter instability/"ejection by LV", typical recordings could not be made).    LVEDP 19 mmHg.  Moderate pulmonary with mean pressure 35 mmHg.  Pulmonary capillary wedge mean 22 mmHg.  Cardiac output 7.8 by Fick 7.4  RECOMMENDATIONS:   Referred to Dr. Cyndia Bent for surgical consultation as part of the multidisciplinary valve evaluation.   Recommendations  Antiplatelet/Anticoag Recommend Aspirin 81mg  daily for moderate CAD.   Surgeon Notes    01/29/2021 10:20 AM CV Procedure signed by Belva Crome, MD    Indications  Calcific aortic stenosis [I35.0 (ICD-10-CM)]   Procedural Details  Technical Details The right radial area was sterilely prepped and draped. Intravenous sedation with Versed and fentanyl was administered. 1% Xylocaine was infiltrated to achieve local analgesia. Using real-time vascular ultrasound, a double wall stick with an angiocath was utilized to obtain intra-arterial access. A VUS image was saved for the permanent record.The modified Seldinger technique was  used to place a 46F " Slender" sheath in the right radial artery. Weight based heparin was administered. Coronary angiography was done using 5 F catheters. Right coronary angiography was performed with a JR4. Left ventricular hemodymic recording was done using the JR 4 catheter.  That was inability to maintain stable left ventricular catheter position after wire removal.  The valve was crossed 3 times and on each occasion, after wire removal the Judkins right catheter would be ejected into the aorta after 1 beat.  Left coronary angiography was performed with a JL 3.5 cm.  Right heart catheterization was performed by exchanging a previously placed antecubital IV angio-cath for a 7 French Slender sheath. 1% Xylocaine was used to locally nesthetize the area around the IV site. The IV catheter was wired using an .018 guidewire. The modified Seldinger technique was used to place the 7 [place] sheath. Double glove technique was used to enhance sterility. After sheath insertion, right heart cath was performed using a 7 [place] Swan Ganz balloon tipped catheter and fluoroscopic guidance. Pressures were recorded in each chamber and in the pulmonary capillary wedge position.. The main pulmonary artery O2 saturation was sampled.   Hemostasis was achieved using a pneumatic band.  During this procedure the patient is administered a total of Versed 1.5 mg and Fentanyl 37.5 mcg to achieve and maintain moderate conscious sedation.  The patient's heart rate, blood pressure, and oxygen saturation are monitored continuously during the procedure. The period of conscious sedation is 33 minutes, of which I was present face-to-face 100% of this time. Estimated blood loss <50 mL.   During this procedure medications were administered to achieve and maintain moderate conscious sedation while  the patient's heart rate, blood pressure, and oxygen saturation were continuously monitored and I was present face-to-face 100% of this time.    Medications (Filter: Administrations occurring from 0855 to 1024 on 01/29/21)  Heparin (Porcine) in NaCl 1000-0.9 UT/500ML-% SOLN (mL) Total volume:  1,000 mL  Date/Time Rate/Dose/Volume Action   01/29/21 0907 500 mL Given   0907 500 mL Given    fentaNYL (SUBLIMAZE) injection (mcg) Total dose:  37.5 mcg  Date/Time Rate/Dose/Volume Action   01/29/21 0937 25 mcg Given   0944 12.5 mcg Given    midazolam (VERSED) injection (mg) Total dose:  1.5 mg  Date/Time Rate/Dose/Volume Action   01/29/21 0937 1 mg Given   0944 0.5 mg Given    lidocaine (PF) (XYLOCAINE) 1 % injection (mL) Total volume:  7 mL  Date/Time Rate/Dose/Volume Action   01/29/21 0938 2 mL Given   0939 5 mL Given    Radial Cocktail/Verapamil only (mL) Total volume:  10 mL  Date/Time Rate/Dose/Volume Action   01/29/21 0942 10 mL Given    heparin sodium (porcine) injection (Units) Total dose:  5,000 Units  Date/Time Rate/Dose/Volume Action   01/29/21 0955 5,000 Units Given    iohexol (OMNIPAQUE) 350 MG/ML injection (mL) Total volume:  45 mL  Date/Time Rate/Dose/Volume Action   01/29/21 1022 45 mL Given     Sedation Time  Sedation Time Physician-1: 32 minutes 37 seconds   Contrast  Medication Name Total Dose  iohexol (OMNIPAQUE) 350 MG/ML injection 45 mL    Radiation/Fluoro  Fluoro time: 7.2 (min) DAP: 14.7 (Gycm2) Cumulative Air Kerma: 284 (mGy)   Coronary Findings   Diagnostic Dominance: Right  No diagnostic findings have been documented.  Intervention   No interventions have been documented.  Right Heart  Right Heart Pressures Hemodynamic findings consistent with moderate pulmonary hypertension.  Right Atrium Right atrial pressure is elevated.   Left Heart  Left Ventricle LV end diastolic pressure is mildly elevated.   Coronary Diagrams   Diagnostic Dominance: Right    Intervention    Implants    No implant documentation for this case.    Syngo  Images  Show images for CARDIAC CATHETERIZATION  Images on Long Term Storage  Show images for Staples, Jowanda Heeg to Procedure Log  Procedure Log     Hemo Data  Flowsheet Row Most Recent Value  Fick Cardiac Output 7.77 L/min  Fick Cardiac Output Index 3.85 (L/min)/BSA  Thermal Cardiac Output 7.39 L/min  Thermal Cardiac Output Index 3.67 (L/min)/BSA  RA A Wave 12 mmHg  RA V Wave 10 mmHg  RA Mean 9 mmHg  RV Systolic Pressure 45 mmHg  RV Diastolic Pressure 4 mmHg  RV EDP 10 mmHg  PA Systolic Pressure 45 mmHg  PA Diastolic Pressure 21 mmHg  PA Mean 35 mmHg  PW A Wave 25 mmHg  PW V Wave 28 mmHg  PW Mean 22 mmHg  AO Systolic Pressure 027 mmHg  AO Diastolic Pressure 68 mmHg  AO Mean 95 mmHg  TPVR Index 9.54 HRUI  TSVR Index 25.9 HRUI  PVR SVR Ratio 0.15  TPVR/TSVR Ratio 0.37      Cardiac TAVR CT  TECHNIQUE: The patient was scanned on a [employer]. A 110 kV retrospective scan was triggered in the descending thoracic aorta at 111 HU's. Gantry rotation speed was 250 msecs and collimation was .6 mm. No beta blockade or nitro were given. The 3D data set was reconstructed in 5% intervals of the R-R cycle.  Systolic and diastolic phases were analyzed on a dedicated work station using MPR, MIP and VRT modes. The patient received 80 cc of contrast.  FINDINGS: Image quality: Excellent.  Noise artifact is: Limited.  Valve Morphology: The aortic valve is tricuspid. The leaflets and severely calcified with diffuse bulky calcifications. The leaflets demonstrate severely restricted motion in systole.  Aortic Valve Calcium score: 2253  Aortic annular dimension:  Phase assessed: 20%  Annular area: 401 mm2  Annular perimeter: 72.9 mm  Max diameter: 27.4 mm  Min diameter: 19.3 mm  Annular and subannular calcification: None.  Optimal coplanar projection: LAO 26 CAU 1  Coronary Artery Height above Annulus:  Left Main: 11.5 mm  Right  Coronary: 14.4 mm  Sinus of Valsalva Measurements:  Non-coronary: 29 mm  Right-coronary: 29 mm  Left-coronary: 31 mm  Sinus of Valsalva Height:  Non-coronary: 21.0 mm  Right-coronary: 19.3 mm  Left-coronary: 17.7 mm  Sinotubular Junction:  Ascending Thoracic Aorta:  Coronary Arteries: Normal coronary origin. Right dominance. The study was performed without use of NTG and is insufficient for plaque evaluation. Please refer to recent cardiac catheterization for coronary assessment. The following assessment was made:  Left main: Normal origin from the [facility]. Bifurcates to form the left anterior descending artery and left circumflex artery. There is no plaque or stenosis.  LAD: There is minimal calcified plaque (<25%) in the mid segment. The LAD gives off 2 diagonal branches without plaque or stenosis.  LCX: There is plaque or stenosis.  RCA: Normal origin from the [place]. There is minimal mixed density plaque (<25%). The RCA terminates as a patent PDA and PLV branch.  Cardiac Morphology:  Right Atrium: Right atrial size is within normal limits.  Right Ventricle: The right ventricular cavity is within normal limits.  Left Atrium: Left atrial size is normal in size with no left atrial appendage filling defect.  Left Ventricle: The ventricular cavity size is within normal limits. There are no stigmata of prior infarction. There is no abnormal filling defect. Normal left ventricular function, LVEF 82%. No regional wall motion abnormalities.  Pulmonary arteries: Normal in size without proximal filling defect.  Pulmonary veins: Normal pulmonary venous drainage.  Pericardium: Normal thickness with no significant effusion or calcium present.  Mitral Valve: The mitral valve is normal structure with moderate annular calcification.  Extra-cardiac findings: See attached radiology report for non-cardiac structures.  IMPRESSION: 1. 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  2. Annular measurements appropriate for 23 mm Edwards Sapien 3 TAVR (401 mm2).  3. No significant annular or subannular calcifications.  4. Sufficient coronary to annulus distance.  5. Optimal Fluoroscopic Angle for Delivery: LAO 26 CAU 1  6. 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  7. Moderate mitral annular calcification.  Lake Bells T. Audie Box, MD   Electronically Signed   By: Eleonore Chiquito   On: 02/01/2021 13:20    CT ANGIOGRAPHY CHEST, ABDOMEN AND PELVIS  TECHNIQUE: Multidetector CT imaging through the chest, abdomen and pelvis was performed using the standard protocol during bolus administration of intravenous contrast. Multiplanar reconstructed images and MIPs were obtained and reviewed to evaluate the vascular anatomy.  CONTRAST:  123mL OMNIPAQUE IOHEXOL 350 MG/ML SOLN  COMPARISON:  No priors.  FINDINGS: CTA CHEST FINDINGS  Cardiovascular: Heart size is mildly enlarged. There is no significant pericardial fluid, thickening or pericardial calcification. Aortic atherosclerosis. No definite coronary artery calcifications. Severe thickening and calcification of the aortic valve. Severe calcifications of the mitral annulus.  Mediastinum/Lymph  Nodes: No pathologically enlarged mediastinal or hilar lymph nodes. Esophagus is unremarkable in appearance.  Lungs/Pleura: Right-sided Bochdalek's hernia. Scattered areas of mild linear scarring are noted in the lung bases bilaterally. No acute consolidative airspace disease. No pleural effusions. No suspicious appearing pulmonary nodules or masses are noted.  Musculoskeletal/Soft Tissues: There are no aggressive appearing lytic or blastic lesions noted in the visualized portions of the skeleton.  CTA ABDOMEN AND PELVIS FINDINGS  Hepatobiliary: No suspicious cystic or solid hepatic lesions. No intra or extrahepatic biliary ductal dilatation. Gallbladder  is normal in appearance.  Pancreas: No pancreatic mass. No pancreatic ductal dilatation. No pancreatic or peripancreatic fluid collections or inflammatory changes.  Spleen: Unremarkable.  Adrenals/Urinary Tract: Horseshoe kidney (normal anatomical variant). No suspicious renal lesions. No hydroureteronephrosis. Bilateral adrenal glands are normal in appearance. Urinary bladder is nearly decompressed, but otherwise unremarkable in appearance.  Stomach/Bowel: The appearance of the stomach is normal. No pathologic dilatation of small bowel or colon. A few scattered colonic diverticulae are noted, without surrounding inflammatory changes to suggest an acute diverticulitis at this time. The appendix is not confidently identified and may be surgically absent. Regardless, there are no inflammatory changes noted adjacent to the cecum to suggest the presence of an acute appendicitis at this time.  Vascular/Lymphatic: Aortic atherosclerosis, without evidence of aneurysm or dissection noted in the abdominal or pelvic vasculature. Vascular findings and measurements pertinent to potential TAVR procedure, as detailed the low. No lymphadenopathy noted in the abdomen or pelvis.  Reproductive: Status post hysterectomy. Right ovary is not confidently identified may be surgically absent or atrophic. Well-defined low-attenuation left adnexal lesion measuring 3.2 x 2.5 x 3.2 cm, likely a cyst.  Other: No significant volume of ascites.  No pneumoperitoneum.  Musculoskeletal: Status post PLIF at L4-L5. There are no aggressive appearing lytic or blastic lesions noted in the visualized portions of the skeleton.  VASCULAR MEASUREMENTS PERTINENT TO TAVR:  AORTA:  Minimal Aortic Diameter-12 x 14 mm  Severity of Aortic Calcification-moderate  RIGHT PELVIS:  Right Common Iliac Artery -  Minimal Diameter-9.7 x 9.3 mm  Tortuosity-mild  Calcification-mild  Right External  Iliac Artery -  Minimal Diameter-8.8 x 8.7 mm  Tortuosity-severe  Calcification-none  Right Common Femoral Artery -  Minimal Diameter-9.3 x 8.3 mm  Tortuosity-mild  Calcification-mild  LEFT PELVIS:  Left Common Iliac Artery -  Minimal Diameter-10.4 x 8.8 mm  Tortuosity-mild  Calcification-mild  Left External Iliac Artery -  Minimal Diameter-8.6 x 8.8 mm  Tortuosity-severe  Calcification-none  Left Common Femoral Artery -  Minimal Diameter-9.0 x 9.0 mm  Tortuosity-mild  Calcification-none  Review of the MIP images confirms the above findings.  IMPRESSION: 1. Vascular findings and measurements pertinent to potential TAVR procedure, as detailed above. 2. Severe thickening calcification of the aortic valve, compatible with reported clinical history of severe aortic stenosis. 3. Horseshoe kidney, normal anatomical variant, incidentally noted. 4. Colonic diverticulosis without evidence of acute diverticulitis at this time. 5. Additional incidental findings, as above.   Electronically Signed   By: Vinnie Langton M.D.   On: 02/02/2021 07:09    EKG: NSR w/out significant AV conduction delay (01/10/2021)    Impression:  Patient has stage D1 severe symptomatic aortic stenosis.  She has a long history of symptoms of exertional shortness of breath that have progressed fairly rapidly over the last few months, currently consistent with chronic diastolic congestive heart failure, [employer] [place] [employer] functional Garde IIIb.  I have personally reviewed the patient's recent transthoracic echocardiogram,  diagnostic cardiac catheterization, CT angiograms, and baseline EKG.  Echocardiogram reveals hyperdynamic left ventricular systolic function with severe aortic stenosis.  The aortic valve appears trileaflet with severe thickening, calcification, and restricted leaflet mobility involving all 3 leaflets.  Peak velocity across aortic valve  measured greater than 5 m/s corresponding to mean transvalvular gradient estimated 69 mmHg and aortic valve area calculated only 0.81 cm by VTI.  The DVI was reported 0.26.  Diagnostic cardiac catheterization confirmed the presence of severe aortic stenosis and revealed normal coronary artery anatomy with no significant coronary artery disease.  Right heart pressures were mildly elevated.  Cardiac-gated CTA of the heart reveals anatomical characteristics consistent with aortic stenosis suitable for treatment by transcatheter aortic valve replacement without any significant complicating features although the patient does have relatively small size aortic annulus and aortic root.   CTA of the aorta and iliac vessels demonstrate what appears to be adequate pelvic vascular access to facilitate a transfemoral approach.  Baseline EKG reveals sinus rhythm without significant AV conduction delay.  Risks associated with conventional surgery would be somewhat elevated by the patient's obesity and the presence of small size aortic root which would likely require root enlargement or root replacement in order to avoid the development of patient prosthesis mismatch.  I favor transcatheter aortic valve replacement using self-expanding supravalvular transcatheter heart valve to minimize the risk of patient prosthesis mismatch.    Plan:  The patient and her husband were counseled at length regarding treatment alternatives for management of severe symptomatic aortic stenosis. Alternative approaches such as conventional aortic valve replacement, transcatheter aortic valve replacement, and continued medical therapy without intervention were compared and contrasted at length.  The risks associated with conventional surgical aortic valve replacement were discussed in detail, as were expectations for post-operative convalescence.  Issues specific to transcatheter aortic valve replacement were discussed including questions about  long term valve durability, the potential for paravalvular leak, possible increased risk of need for permanent pacemaker placement, and other technical complications related to the procedure itself.  Long-term prognosis with medical therapy was discussed. This discussion was placed in the context of the patient's own specific clinical presentation and past medical history.  All of their questions have been addressed.  The patient desires to proceed with transcatheter aortic valve replacement as soon as practical.  We tentatively plan to proceed with surgery on February 12, 2021.  Following the decision to proceed with transcatheter aortic valve replacement, a discussion has been held regarding what types of management strategies would be attempted intraoperatively in the event of life-threatening complications, including whether or not the patient would be considered a candidate for the use of cardiopulmonary bypass and/or conversion to open sternotomy for attempted surgical intervention.  The patient specifically requests that should a potentially life-threatening complication develop we would attempt emergency median sternotomy and/or other aggressive surgical procedures.  The patient has been advised of a variety of complications that might develop including but not limited to risks of death, stroke, paravalvular leak, aortic dissection or other major vascular complications, aortic annulus rupture, device embolization, cardiac rupture or perforation, mitral regurgitation, acute myocardial infarction, arrhythmia, heart block or bradycardia requiring permanent pacemaker placement, congestive heart failure, respiratory failure, renal failure, pneumonia, infection, other late complications related to structural valve deterioration or migration, or other complications that might ultimately cause a temporary or permanent loss of functional independence or other long term morbidity.  The patient provides full informed  consent for the procedure as described and all questions  were answered.      I spent in excess of 90 minutes during the conduct of this office consultation and >50% of this time involved direct face-to-face encounter with the patient for counseling and/or coordination of their care.      Valentina Gu. Roxy Manns, MD 02/04/2021 2:33 PM

## 2021-02-04 NOTE — Progress Notes (Signed)
[facility]  CARDIOTHORACIC SURGERY CONSULTATION REPORT  Referring Provider is O'Neal, Cassie Freer, MD PCP is Scifres, Earlie Server, PA-C  Chief Complaint  Patient presents with  . Aortic Stenosis    New patient consultation, review all studies    HPI:  Patient is 70 year old morbidly obese female with history of aortic stenosis, hypertension, hyperlipidemia, and mixed connective tissue disease who has been referred for surgical consultation to discuss treatment options for management of severe symptomatic aortic stenosis.  The patient states that she has noted the presence of a heart murmur for many years.  She was followed for several years by a cardiologist in [place] with known diagnosis of aortic stenosis that had gradually progressed in severity.  She also has a long history of dyspnea on exertion.  Symptoms have progressed fairly dramatically over the last few months to the point where recently the patient gets short of breath with very low level activity and occasionally at rest.  She recently moved from [place] to [facility] and establish care with Dr. Farris Has.  Routine follow-up transthoracic echocardiogram performed January 15, 2021 confirmed the presence of severe aortic stenosis.  Peak velocity across aortic valve measured at size 5.3 m/s corresponding to mean transvalvular gradient estimated 69 mmHg and aortic valve area calculated 0.81 cm by VTI.  The DVI was reported 0.26 with stroke-volume index 49.  Left ventricular systolic function was hyperdynamic with ejection fraction estimated 65 to 70%.  The patient was referred to the multidisciplinary heart valve clinic and has been evaluated previously by Dr. Burt Knack.  Diagnostic cardiac catheterization was performed January 29, 2021 and confirmed presence of severe aortic stenosis.  There was normal coronary artery anatomy with no flow-limiting coronary artery  disease.  Right heart pressures were mildly elevated.  CT angiography was performed and the patient referred for surgical consultation.  Patient is married and both she and her husband just recently moved from [place] to [place] to live close to their son.  They have just recently purchased a house in [place] which they plan to close on within the week.  The patient admits that she has been overweight for much of her adult life.  Last year she had lost 60 pounds in weight on a low-carb diet with frequent walking.  She has gained some of that back in the winter months as she was getting ready for the move and walking much less.  She has a longstanding history of exertional shortness of breath.  Symptoms have progressed dramatically over the past several months.  She now gets short of breath with low-level activity.  She cannot lay flat in bed.  She has had several episodes of PND.  She has had some dizziness without syncope.  Past Medical History:  Diagnosis Date  . Asthma   . Connective tissue disease ([place])   . Hyperlipidemia   . Hypertension   . Severe aortic stenosis     Past Surgical History:  Procedure Laterality Date  . CARDIAC CATHETERIZATION    . LUMBAR FUSION    . RIGHT/LEFT HEART CATH AND CORONARY ANGIOGRAPHY N/A 01/29/2021   Procedure: RIGHT/LEFT HEART CATH AND CORONARY ANGIOGRAPHY;  Surgeon: Belva Crome, MD;  Location: [facility];  Service: Cardiovascular;  Laterality: N/A;  . ROTATOR CUFF REPAIR      Family History  Problem Relation Age of Onset  . COPD Mother   . Heart attack Father 63  . Cancer  Sister   . Cirrhosis Brother   . Heart disease Brother     Social History   Socioeconomic History  . Marital status: Married    Spouse name: Not on file  . Number of children: 1  . Years of education: Not on file  . Highest education level: Not on file  Occupational History  . Occupation: retired bookkeeper  Tobacco Use  . Smoking status: Former  Smoker    Packs/day: 0.50    Years: 6.00    Pack years: 3.00    Quit date: 1976    Years since quitting: 46.3  . Smokeless tobacco: Never Used  Substance and Sexual Activity  . Alcohol use: Never  . Drug use: Never  . Sexual activity: Not on file  Other Topics Concern  . Not on file  Social History Narrative  . Not on file   Social Determinants of Health   Financial Resource Strain: Not on file  Food Insecurity: Not on file  Transportation Needs: Not on file  Physical Activity: Not on file  Stress: Not on file  Social Connections: Not on file  Intimate Partner Violence: Not on file    Current Outpatient Medications  Medication Sig Dispense Refill  . acetaminophen (TYLENOL) 500 MG tablet Take 1,000 mg by mouth 2 (two) times daily as needed for moderate pain (Takes 1000 mg every morning).    Marland Kitchen amLODipine (NORVASC) 10 MG tablet Take 10 mg by mouth daily.    . APPLE CIDER VINEGAR PO Take 1,500 mg by mouth daily.    Marland Kitchen Bioflavonoid Products (ESTER C PO) Take 1,000 mg by mouth daily.    . Cholecalciferol (VITAMIN D) 125 MCG (5000 UT) CAPS Take 5,000 Units by mouth daily.    . Cyanocobalamin (B-12) 1000 MCG SUBL Place 1,000 mcg under the tongue daily.    . DULoxetine (CYMBALTA) 60 MG capsule Take 60 mg by mouth daily.    . indapamide (LOZOL) 2.5 MG tablet Take 2.5 mg by mouth daily.    . Magnesium 100 MG CAPS Take 100 mg by mouth daily.    . meloxicam (MOBIC) 15 MG tablet Take 15 mg by mouth daily.    Marland Kitchen omeprazole (PRILOSEC) 40 MG capsule Take 40 mg by mouth daily.    . rosuvastatin (CRESTOR) 10 MG tablet Take 10 mg by mouth daily.    Marland Kitchen telmisartan (MICARDIS) 80 MG tablet Take 80 mg by mouth daily.    . TURMERIC PO Take 1,950 mg by mouth daily. With 300 mg Ginger     No current facility-administered medications for this visit.    Allergies  Allergen Reactions  . Dobutamine     Decreases heart rate   . Latex Rash  . Sulfa Antibiotics Rash      Review of  Systems:   General:  normal appetite, decreased energy, + weight gain, no weight loss, no fever  Cardiac:  no chest pain with exertion, no chest pain at rest, +SOB with low level exertion, occasional resting SOB, + PND, + orthopnea, no palpitations, no arrhythmia, no atrial fibrillation, no LE edema, + dizzy spells, no syncope  Respiratory:  + shortness of breath, no home oxygen, no productive cough, no dry cough, no bronchitis, + wheezing, no hemoptysis, + asthma, no pain with inspiration or cough, no sleep apnea, no CPAP at night  GI:   no difficulty swallowing, + reflux, no frequent heartburn, no hiatal hernia, no abdominal pain, no constipation, no diarrhea, no hematochezia, no  hematemesis, no melena  GU:   no dysuria,  no frequency, no urinary tract infection, no hematuria, no  kidney stones, no kidney disease  Vascular:  no pain suggestive of claudication, no pain in feet, no leg cramps, no varicose veins, no DVT, no non-healing foot ulcer  Neuro:   no stroke, no TIA's, no seizures, no headaches, no temporary blindness one eye,  no slurred speech, no peripheral neuropathy, no chronic pain, no instability of gait, no memory/cognitive dysfunction  Musculoskeletal: + arthritis, no joint swelling, no myalgias, no difficulty walking, normal mobility   Skin:   no rash, no itching, no skin infections, no pressure sores or ulcerations  Psych:   no anxiety, + depression, no nervousness, no unusual recent stress  Eyes:   no blurry vision, no floaters, no recent vision changes, + wears glasses or contacts  ENT:   no hearing loss, no loose or painful teeth, no dentures, last saw dentist 6 months ago  Hematologic:  no easy bruising, no abnormal bleeding, no clotting disorder, no frequent epistaxis  Endocrine:  no diabetes, does not check CBG's at home           Physical Exam:   BP 138/70 (BP Location: Left Arm, Patient Position: Sitting, Cuff Size: Large)   Pulse 77   Temp 97.9 F (36.6 C) (Skin)    Resp 20   Ht 5' 2.75" (1.594 m)   Wt 220 lb (99.8 kg)   SpO2 94% Comment: RA  BMI 39.28 kg/m   General:  Obese,  well-appearing  HEENT:  Unremarkable   Neck:   no JVD, no bruits, no adenopathy   Chest:   clear to auscultation, symmetrical breath sounds, no wheezes, no rhonchi   CV:   RRR, grade III/VI crescendo/decrescendo murmur heard best at RUSB,  no diastolic murmur  Abdomen:  soft, non-tender, no masses   Extremities:  warm, well-perfused, pulses not palpable, no LE edema  Rectal/GU  Deferred  Neuro:   Grossly non-focal and symmetrical throughout  Skin:   Clean and dry, no rashes, no breakdown   Diagnostic Tests:  ECHOCARDIOGRAM REPORT       Patient Name:  Katherine Waters  Date of Exam: 01/15/2021  Medical Rec #: [id number]   Height:    62.8 in  Accession #:  [id number]  Weight:    222.0 lb  Date of Birth: 05/09/1951   BSA:     2.016 m  Patient Age:  27 years   BP:      152/80 mmHg  Patient Gender: F       HR:      80 bpm.  Exam Location: [address]   Procedure: 2D Echo, 3D Echo, Cardiac Doppler, Color Doppler and Strain  Analysis   Indications:  I35.0 Aortic Stenosis    History:    Patient has no prior history of Echocardiogram  examinations.         Aortic Valve Disease, Signs/Symptoms:Shortness of Breath;  Risk         Factors:Family History of Coronary Artery Disease,  Hypertension,         Dyslipidemia and Former Smoker. Prior Studies done in  [place].    Sonographer:  Deliah Boston RDCS  Referring Phys: [id number] [place]    1. Very severe aortic valve stenosis. DI is 0.26, impacted by  hyperdynamic state and high flow velocities. The aortic valve is abnormal.  There is severe calcifcation of the aortic valve. Aortic  valve  regurgitation is trivial. Very severe aortic valve  stenosis. Aortic valve area, by VTI measures 0.81 cm. Aortic  valve mean  gradient measures 69.0 mmHg from right sternal border. Aortic valve Vmax  measures 5.30 m/s.  2. Left ventricular ejection fraction, by estimation, is 65 to 70%. The  left ventricle has normal function. The left ventricle has no regional  wall motion abnormalities. There is moderate asymmetric left ventricular  hypertrophy of the basal-septal and  and mild LVH in other segments. Left ventricular diastolic parameters are  consistent with Grade II diastolic dysfunction (pseudonormalization).  Elevated left atrial pressure. The average left ventricular global  longitudinal strain is 18.8 %. The global  longitudinal strain is normal.  3. Right ventricular systolic function is normal. The right ventricular  size is normal. There is normal pulmonary artery systolic pressure. The  estimated right ventricular systolic pressure is 18.8 mmHg.  4. Left atrial size was severely dilated.  5. Degenerative mitral valve with mild-moderate mitral annular  calcification. DVI 0.51, MVA by continuity 1.7 cm2, however LVOT TVI  elevated, which may overestimate DVI and valve area due to hyperdynamic  state and high flow velocities. The mitral valve  is degenerative. Mild mitral valve regurgitation. Mild to moderate mitral  stenosis. The mean mitral valve gradient is 6.0 mmHg with average heart  rate of 77 bpm.  6. The inferior vena cava is normal in size with <50% respiratory  variability, suggesting right atrial pressure of 8 mmHg.   FINDINGS  Left Ventricle: Left ventricular ejection fraction, by estimation, is 65  to 70%. The left ventricle has normal function. The left ventricle has no  regional wall motion abnormalities. The average left ventricular global  longitudinal strain is 18.8 %. The  global longitudinal strain is normal. The left ventricular internal  cavity size was normal in size. There is moderate asymmetric left  ventricular hypertrophy of the basal-septal and and mild  LVH in other  segments. Left ventricular diastolic parameters  are consistent with Grade II diastolic dysfunction (pseudonormalization).  Elevated left atrial pressure.   Right Ventricle: The right ventricular size is normal. No increase in  right ventricular wall thickness. Right ventricular systolic function is  normal. There is normal pulmonary artery systolic pressure. The tricuspid  regurgitant velocity is 2.53 m/s, and  with an assumed right atrial pressure of 8 mmHg, the estimated right  ventricular systolic pressure is 41.6 mmHg.   Left Atrium: Left atrial size was severely dilated.   Right Atrium: Right atrial size was normal in size.   Pericardium: There is no evidence of pericardial effusion.   Mitral Valve: Degenerative mitral valve with mild-moderate mitral annular  calcification. DVI 0.51, MVA by continuity 1.7 cm2, however LVOT TVI  elevated, which may overestimate DVI and valve area due to hyperdynamic  state and high flow velocities. The  mitral valve is degenerative in appearance. Mild to moderate mitral  annular calcification. Mild mitral valve regurgitation. Mild to moderate  mitral valve stenosis. MV peak gradient, 12.7 mmHg. The mean mitral valve  gradient is 6.0 mmHg with average heart  rate of 77 bpm.   Tricuspid Valve: The tricuspid valve is normal in structure. Tricuspid  valve regurgitation is trivial. No evidence of tricuspid stenosis.   Aortic Valve: Very severe aortic valve stenosis. DI is 0.26, impacted by  hyperdynamic state and high flow velocities. The aortic valve is abnormal.  There is severe calcifcation of the aortic valve. Aortic valve  regurgitation is trivial.  Aortic  regurgitation PHT measures 382 msec. Very severe aortic valve stenosis.  Aortic valve mean gradient measures 69.0 mmHg. Aortic valve peak gradient  measures 112.4 mmHg. Aortic valve area, by VTI measures 0.81 cm.   Pulmonic Valve: The pulmonic valve was not well  visualized. Pulmonic valve  regurgitation is trivial. No evidence of pulmonic stenosis.   Aorta: The aortic root is normal in size and structure.   Venous: The inferior vena cava is normal in size with less than 50%  respiratory variability, suggesting right atrial pressure of 8 mmHg.   IAS/Shunts: No atrial level shunt detected by color flow Doppler.     LEFT VENTRICLE  PLAX 2D  LVIDd:     4.60 cm Diastology  LVIDs:     2.50 cm LV e' medial:  5.44 cm/s  LV PW:     1.10 cm LV E/e' medial: 30.2  LV IVS:    1.40 cm LV e' lateral:  4.57 cm/s  LVOT diam:   2.00 cm LV E/e' lateral: 36.0  LV SV:     99  LV SV Index:  49    2D Longitudinal Strain  LVOT Area:   3.14 cm 2D Strain GLS (A2C):  18.3 %             2D Strain GLS (A3C):  20.3 %             2D Strain GLS (A4C):  17.8 %             2D Strain GLS Avg:   18.8 %               3D Volume EF:             3D EF:    61 %             LV EDV:    154 ml             LV ESV:    60 ml             LV SV:    94 ml   RIGHT VENTRICLE  RV S prime:   17.30 cm/s  TAPSE (M-mode): 3.1 cm   LEFT ATRIUM       Index    RIGHT ATRIUM      Index  LA diam:    5.20 cm 2.58 cm/m RA Area:   12.90 cm  LA Vol (A2C):  133.0 ml 65.98 ml/m RA Volume:  27.20 ml 13.49 ml/m  LA Vol (A4C):  72.0 ml 35.72 ml/m  LA Biplane Vol: 102.0 ml 50.60 ml/m  AORTIC VALVE  AV Area (Vmax):  0.82 cm  AV Area (Vmean):  0.91 cm  AV Area (VTI):   0.81 cm  AV Vmax:      530.00 cm/s  AV Vmean:     345.000 cm/s  AV VTI:      1.221 m  AV Peak Grad:   112.4 mmHg  AV Mean Grad:   69.0 mmHg  LVOT Vmax:     139.00 cm/s  LVOT Vmean:    99.650 cm/s  LVOT VTI:     0.316 m  LVOT/AV VTI ratio: 0.26  AI PHT:      382 msec    AORTA  Ao  Root diam: 3.10 cm  Ao Asc diam: 3.70 cm   MITRAL VALVE        TRICUSPID VALVE  MV Area (PHT): cm  TR Peak grad:  25.6 mmHg  MV Peak grad: 12.7 mmHg  TR Vmax:    253.00 cm/s  MV Mean grad: 6.0 mmHg  MV Vmax:    1.78 m/s   SHUNTS  MV Vmean:   101.6 cm/s  Systemic VTI: 0.32 m  MV Decel Time: 302 msec   Systemic Diam: 2.00 cm  MV E velocity: 164.50 cm/s  MV A velocity: 168.50 cm/s  MV E/A ratio: 0.98   Cherlynn Kaiser MD  Electronically signed by Cherlynn Kaiser MD  Signature Date/Time: 01/15/2021/11:30:02 PM      RIGHT/LEFT HEART CATH AND CORONARY ANGIOGRAPHY    Conclusion   Fluoroscopy demonstrated severe aortic valve calcification with minimal movement and heavily calcified mitral annulus.  Right dominant coronary anatomy with widely patent normal coronary arteries.  Severe calcific aortic stenosis.  (Greater than 70 mmHg peak gradient, but due to catheter instability/"ejection by LV", typical recordings could not be made).    LVEDP 19 mmHg.  Moderate pulmonary with mean pressure 35 mmHg.  Pulmonary capillary wedge mean 22 mmHg.  Cardiac output 7.8 by Fick 7.4  RECOMMENDATIONS:   Referred to Dr. Cyndia Bent for surgical consultation as part of the multidisciplinary valve evaluation.   Recommendations  Antiplatelet/Anticoag Recommend Aspirin 81mg  daily for moderate CAD.   Surgeon Notes    01/29/2021 10:20 AM CV Procedure signed by Belva Crome, MD    Indications  Calcific aortic stenosis [I35.0 (ICD-10-CM)]   Procedural Details  Technical Details The right radial area was sterilely prepped and draped. Intravenous sedation with Versed and fentanyl was administered. 1% Xylocaine was infiltrated to achieve local analgesia. Using real-time vascular ultrasound, a double wall stick with an angiocath was utilized to obtain intra-arterial access. A VUS image was saved for the permanent record.The modified Seldinger technique was  used to place a 16F " Slender" sheath in the right radial artery. Weight based heparin was administered. Coronary angiography was done using 5 F catheters. Right coronary angiography was performed with a JR4. Left ventricular hemodymic recording was done using the JR 4 catheter.  That was inability to maintain stable left ventricular catheter position after wire removal.  The valve was crossed 3 times and on each occasion, after wire removal the Judkins right catheter would be ejected into the aorta after 1 beat.  Left coronary angiography was performed with a JL 3.5 cm.  Right heart catheterization was performed by exchanging a previously placed antecubital IV angio-cath for a 7 French Slender sheath. 1% Xylocaine was used to locally nesthetize the area around the IV site. The IV catheter was wired using an .018 guidewire. The modified Seldinger technique was used to place the 7 [place] sheath. Double glove technique was used to enhance sterility. After sheath insertion, right heart cath was performed using a 7 [place] Swan Ganz balloon tipped catheter and fluoroscopic guidance. Pressures were recorded in each chamber and in the pulmonary capillary wedge position.. The main pulmonary artery O2 saturation was sampled.   Hemostasis was achieved using a pneumatic band.  During this procedure the patient is administered a total of Versed 1.5 mg and Fentanyl 37.5 mcg to achieve and maintain moderate conscious sedation.  The patient's heart rate, blood pressure, and oxygen saturation are monitored continuously during the procedure. The period of conscious sedation is 33 minutes, of which I was present face-to-face 100% of this time. Estimated blood loss <50 mL.   During this procedure medications were administered to achieve and maintain moderate conscious sedation while  the patient's heart rate, blood pressure, and oxygen saturation were continuously monitored and I was present face-to-face 100% of this time.    Medications (Filter: Administrations occurring from 0855 to 1024 on 01/29/21)  Heparin (Porcine) in NaCl 1000-0.9 UT/500ML-% SOLN (mL) Total volume:  1,000 mL  Date/Time Rate/Dose/Volume Action   01/29/21 0907 500 mL Given   0907 500 mL Given    fentaNYL (SUBLIMAZE) injection (mcg) Total dose:  37.5 mcg  Date/Time Rate/Dose/Volume Action   01/29/21 0937 25 mcg Given   0944 12.5 mcg Given    midazolam (VERSED) injection (mg) Total dose:  1.5 mg  Date/Time Rate/Dose/Volume Action   01/29/21 0937 1 mg Given   0944 0.5 mg Given    lidocaine (PF) (XYLOCAINE) 1 % injection (mL) Total volume:  7 mL  Date/Time Rate/Dose/Volume Action   01/29/21 0938 2 mL Given   0939 5 mL Given    Radial Cocktail/Verapamil only (mL) Total volume:  10 mL  Date/Time Rate/Dose/Volume Action   01/29/21 0942 10 mL Given    heparin sodium (porcine) injection (Units) Total dose:  5,000 Units  Date/Time Rate/Dose/Volume Action   01/29/21 0955 5,000 Units Given    iohexol (OMNIPAQUE) 350 MG/ML injection (mL) Total volume:  45 mL  Date/Time Rate/Dose/Volume Action   01/29/21 1022 45 mL Given     Sedation Time  Sedation Time Physician-1: 32 minutes 37 seconds   Contrast  Medication Name Total Dose  iohexol (OMNIPAQUE) 350 MG/ML injection 45 mL    Radiation/Fluoro  Fluoro time: 7.2 (min) DAP: 14.7 (Gycm2) Cumulative Air Kerma: 284 (mGy)   Coronary Findings   Diagnostic Dominance: Right  No diagnostic findings have been documented.  Intervention   No interventions have been documented.  Right Heart  Right Heart Pressures Hemodynamic findings consistent with moderate pulmonary hypertension.  Right Atrium Right atrial pressure is elevated.   Left Heart  Left Ventricle LV end diastolic pressure is mildly elevated.   Coronary Diagrams   Diagnostic Dominance: Right    Intervention    Implants    No implant documentation for this case.    Syngo  Images  Show images for CARDIAC CATHETERIZATION  Images on Long Term Storage  Show images for Stech, Keerthana Vanrossum to Procedure Log  Procedure Log     Hemo Data  Flowsheet Row Most Recent Value  Fick Cardiac Output 7.77 L/min  Fick Cardiac Output Index 3.85 (L/min)/BSA  Thermal Cardiac Output 7.39 L/min  Thermal Cardiac Output Index 3.67 (L/min)/BSA  RA A Wave 12 mmHg  RA V Wave 10 mmHg  RA Mean 9 mmHg  RV Systolic Pressure 45 mmHg  RV Diastolic Pressure 4 mmHg  RV EDP 10 mmHg  PA Systolic Pressure 45 mmHg  PA Diastolic Pressure 21 mmHg  PA Mean 35 mmHg  PW A Wave 25 mmHg  PW V Wave 28 mmHg  PW Mean 22 mmHg  AO Systolic Pressure 295 mmHg  AO Diastolic Pressure 68 mmHg  AO Mean 95 mmHg  TPVR Index 9.54 HRUI  TSVR Index 25.9 HRUI  PVR SVR Ratio 0.15  TPVR/TSVR Ratio 0.37      Cardiac TAVR CT  TECHNIQUE: The patient was scanned on a [employer]. A 110 kV retrospective scan was triggered in the descending thoracic aorta at 111 HU's. Gantry rotation speed was 250 msecs and collimation was .6 mm. No beta blockade or nitro were given. The 3D data set was reconstructed in 5% intervals of the R-R cycle.  Systolic and diastolic phases were analyzed on a dedicated work station using MPR, MIP and VRT modes. The patient received 80 cc of contrast.  FINDINGS: Image quality: Excellent.  Noise artifact is: Limited.  Valve Morphology: The aortic valve is tricuspid. The leaflets and severely calcified with diffuse bulky calcifications. The leaflets demonstrate severely restricted motion in systole.  Aortic Valve Calcium score: 2253  Aortic annular dimension:  Phase assessed: 20%  Annular area: 401 mm2  Annular perimeter: 72.9 mm  Max diameter: 27.4 mm  Min diameter: 19.3 mm  Annular and subannular calcification: None.  Optimal coplanar projection: LAO 26 CAU 1  Coronary Artery Height above Annulus:  Left Main: 11.5 mm  Right  Coronary: 14.4 mm  Sinus of Valsalva Measurements:  Non-coronary: 29 mm  Right-coronary: 29 mm  Left-coronary: 31 mm  Sinus of Valsalva Height:  Non-coronary: 21.0 mm  Right-coronary: 19.3 mm  Left-coronary: 17.7 mm  Sinotubular Junction:  Ascending Thoracic Aorta:  Coronary Arteries: Normal coronary origin. Right dominance. The study was performed without use of NTG and is insufficient for plaque evaluation. Please refer to recent cardiac catheterization for coronary assessment. The following assessment was made:  Left main: Normal origin from the [facility]. Bifurcates to form the left anterior descending artery and left circumflex artery. There is no plaque or stenosis.  LAD: There is minimal calcified plaque (<25%) in the mid segment. The LAD gives off 2 diagonal branches without plaque or stenosis.  LCX: There is plaque or stenosis.  RCA: Normal origin from the [place]. There is minimal mixed density plaque (<25%). The RCA terminates as a patent PDA and PLV branch.  Cardiac Morphology:  Right Atrium: Right atrial size is within normal limits.  Right Ventricle: The right ventricular cavity is within normal limits.  Left Atrium: Left atrial size is normal in size with no left atrial appendage filling defect.  Left Ventricle: The ventricular cavity size is within normal limits. There are no stigmata of prior infarction. There is no abnormal filling defect. Normal left ventricular function, LVEF 82%. No regional wall motion abnormalities.  Pulmonary arteries: Normal in size without proximal filling defect.  Pulmonary veins: Normal pulmonary venous drainage.  Pericardium: Normal thickness with no significant effusion or calcium present.  Mitral Valve: The mitral valve is normal structure with moderate annular calcification.  Extra-cardiac findings: See attached radiology report for non-cardiac structures.  IMPRESSION: 1. 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  2. Annular measurements appropriate for 23 mm Edwards Sapien 3 TAVR (401 mm2).  3. No significant annular or subannular calcifications.  4. Sufficient coronary to annulus distance.  5. Optimal Fluoroscopic Angle for Delivery: LAO 26 CAU 1  6. 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  7. Moderate mitral annular calcification.  [place]ells T. Audie Box, MD   Electronically Signed   By: Eleonore Chiquito   On: 02/01/2021 13:20    CT ANGIOGRAPHY CHEST, ABDOMEN AND PELVIS  TECHNIQUE: Multidetector CT imaging through the chest, abdomen and pelvis was performed using the standard protocol during bolus administration of intravenous contrast. Multiplanar reconstructed images and MIPs were obtained and reviewed to evaluate the vascular anatomy.  CONTRAST:  162mL OMNIPAQUE IOHEXOL 350 MG/ML SOLN  COMPARISON:  No priors.  FINDINGS: CTA CHEST FINDINGS  Cardiovascular: Heart size is mildly enlarged. There is no significant pericardial fluid, thickening or pericardial calcification. Aortic atherosclerosis. No definite coronary artery calcifications. Severe thickening and calcification of the aortic valve. Severe calcifications of the mitral annulus.  Mediastinum/Lymph  Nodes: No pathologically enlarged mediastinal or hilar lymph nodes. Esophagus is unremarkable in appearance.  Lungs/Pleura: Right-sided Bochdalek's hernia. Scattered areas of mild linear scarring are noted in the lung bases bilaterally. No acute consolidative airspace disease. No pleural effusions. No suspicious appearing pulmonary nodules or masses are noted.  Musculoskeletal/Soft Tissues: There are no aggressive appearing lytic or blastic lesions noted in the visualized portions of the skeleton.  CTA ABDOMEN AND PELVIS FINDINGS  Hepatobiliary: No suspicious cystic or solid hepatic lesions. No intra or extrahepatic biliary ductal dilatation. Gallbladder  is normal in appearance.  Pancreas: No pancreatic mass. No pancreatic ductal dilatation. No pancreatic or peripancreatic fluid collections or inflammatory changes.  Spleen: Unremarkable.  Adrenals/Urinary Tract: Horseshoe kidney (normal anatomical variant). No suspicious renal lesions. No hydroureteronephrosis. Bilateral adrenal glands are normal in appearance. Urinary bladder is nearly decompressed, but otherwise unremarkable in appearance.  Stomach/Bowel: The appearance of the stomach is normal. No pathologic dilatation of small bowel or colon. A few scattered colonic diverticulae are noted, without surrounding inflammatory changes to suggest an acute diverticulitis at this time. The appendix is not confidently identified and may be surgically absent. Regardless, there are no inflammatory changes noted adjacent to the cecum to suggest the presence of an acute appendicitis at this time.  Vascular/Lymphatic: Aortic atherosclerosis, without evidence of aneurysm or dissection noted in the abdominal or pelvic vasculature. Vascular findings and measurements pertinent to potential TAVR procedure, as detailed the low. No lymphadenopathy noted in the abdomen or pelvis.  Reproductive: Status post hysterectomy. Right ovary is not confidently identified may be surgically absent or atrophic. Well-defined low-attenuation left adnexal lesion measuring 3.2 x 2.5 x 3.2 cm, likely a cyst.  Other: No significant volume of ascites.  No pneumoperitoneum.  Musculoskeletal: Status post PLIF at L4-L5. There are no aggressive appearing lytic or blastic lesions noted in the visualized portions of the skeleton.  VASCULAR MEASUREMENTS PERTINENT TO TAVR:  AORTA:  Minimal Aortic Diameter-12 x 14 mm  Severity of Aortic Calcification-moderate  RIGHT PELVIS:  Right Common Iliac Artery -  Minimal Diameter-9.7 x 9.3 mm  Tortuosity-mild  Calcification-mild  Right External  Iliac Artery -  Minimal Diameter-8.8 x 8.7 mm  Tortuosity-severe  Calcification-none  Right Common Femoral Artery -  Minimal Diameter-9.3 x 8.3 mm  Tortuosity-mild  Calcification-mild  LEFT PELVIS:  Left Common Iliac Artery -  Minimal Diameter-10.4 x 8.8 mm  Tortuosity-mild  Calcification-mild  Left External Iliac Artery -  Minimal Diameter-8.6 x 8.8 mm  Tortuosity-severe  Calcification-none  Left Common Femoral Artery -  Minimal Diameter-9.0 x 9.0 mm  Tortuosity-mild  Calcification-none  Review of the MIP images confirms the above findings.  IMPRESSION: 1. Vascular findings and measurements pertinent to potential TAVR procedure, as detailed above. 2. Severe thickening calcification of the aortic valve, compatible with reported clinical history of severe aortic stenosis. 3. Horseshoe kidney, normal anatomical variant, incidentally noted. 4. Colonic diverticulosis without evidence of acute diverticulitis at this time. 5. Additional incidental findings, as above.   Electronically Signed   By: Vinnie Langton M.D.   On: 02/02/2021 07:09    EKG: NSR w/out significant AV conduction delay (01/10/2021)    Impression:  Patient has stage D1 severe symptomatic aortic stenosis.  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  I have personally reviewed the patient's recent transthoracic echocardiogram,  diagnostic cardiac catheterization, CT angiograms, and baseline EKG.  Echocardiogram reveals hyperdynamic left ventricular systolic function with severe aortic stenosis.  The aortic valve appears trileaflet with severe thickening, calcification, and restricted leaflet mobility involving all 3 leaflets.  Peak velocity across aortic valve  measured greater than 5 m/s corresponding to mean transvalvular gradient estimated 69 mmHg and aortic valve area calculated only 0.81 cm by VTI.  The DVI was reported 0.26.  Diagnostic cardiac catheterization confirmed the presence of severe aortic stenosis and revealed normal coronary artery anatomy with no significant coronary artery disease.  Right heart pressures were mildly elevated.  Cardiac-gated CTA of the heart reveals anatomical characteristics consistent with aortic stenosis suitable for treatment by transcatheter aortic valve replacement without any significant complicating features although the patient does have relatively small size aortic annulus and aortic root.   CTA of the aorta and iliac vessels demonstrate what appears to be adequate pelvic vascular access to facilitate a transfemoral approach.  Baseline EKG reveals sinus rhythm without significant AV conduction delay.  Risks associated with conventional surgery would be somewhat elevated by the patient's obesity and the presence of small size aortic root which would likely require root enlargement or root replacement in order to avoid the development of patient prosthesis mismatch.  I favor transcatheter aortic valve replacement using self-expanding supravalvular transcatheter heart valve to minimize the risk of patient prosthesis mismatch.    Plan:  The patient and her husband were counseled at length regarding treatment alternatives for management of severe symptomatic aortic stenosis. Alternative approaches such as conventional aortic valve replacement, transcatheter aortic valve replacement, and continued medical therapy without intervention were compared and contrasted at length.  The risks associated with conventional surgical aortic valve replacement were discussed in detail, as were expectations for post-operative convalescence.  Issues specific to transcatheter aortic valve replacement were discussed including questions about  long term valve durability, the potential for paravalvular leak, possible increased risk of need for permanent pacemaker placement, and other technical complications related to the procedure itself.  Long-term prognosis with medical therapy was discussed. This discussion was placed in the context of the patient's own specific clinical presentation and past medical history.  All of their questions have been addressed.  The patient desires to proceed with transcatheter aortic valve replacement as soon as practical.  We tentatively plan to proceed with surgery on February 12, 2021.  Following the decision to proceed with transcatheter aortic valve replacement, a discussion has been held regarding what types of management strategies would be attempted intraoperatively in the event of life-threatening complications, including whether or not the patient would be considered a candidate for the use of cardiopulmonary bypass and/or conversion to open sternotomy for attempted surgical intervention.  The patient specifically requests that should a potentially life-threatening complication develop we would attempt emergency median sternotomy and/or other aggressive surgical procedures.  The patient has been advised of a variety of complications that might develop including but not limited to risks of death, stroke, paravalvular leak, aortic dissection or other major vascular complications, aortic annulus rupture, device embolization, cardiac rupture or perforation, mitral regurgitation, acute myocardial infarction, arrhythmia, heart block or bradycardia requiring permanent pacemaker placement, congestive heart failure, respiratory failure, renal failure, pneumonia, infection, other late complications related to structural valve deterioration or migration, or other complications that might ultimately cause a temporary or permanent loss of functional independence or other long term morbidity.  The patient provides full informed  consent for the procedure as described and all questions  were answered.      I spent in excess of 90 minutes during the conduct of this office consultation and >50% of this time involved direct face-to-face encounter with the patient for counseling and/or coordination of their care.      Valentina Gu. Roxy Manns, MD 02/04/2021 2:33 PM

## 2021-02-04 NOTE — Patient Instructions (Signed)
   Continue taking all current medications without change through the day before surgery.  Make sure to bring all of your medications with you when you come for your Pre-Admission Testing appointment at [facility] Short-Stay Department.  Have nothing to eat or drink after midnight the night before surgery.  On the morning of surgery do not take any medications  At your appointment for Pre-Admission Testing at the [facility] Short-Stay Department you will be asked to sign permission forms for your upcoming surgery.  By definition your signature on these forms implies that you and/or your designee provide full informed consent for your planned surgical procedure(s), that alternative treatment options have been discussed, that you understand and accept any and all potential risks, and that you have some understanding of what to expect for your post-operative convalescence.  For any major cardiac surgical procedure potential operative risks include but are not limited to at least some risk of death, stroke or other neurologic complication, myocardial infarction, congestive heart failure, respiratory failure, renal failure, bleeding requiring blood transfusion and/or reexploration, irregular heart rhythm, heart block or bradycardia requiring permanent pacemaker, pneumonia, pericardial effusion, pleural effusion, wound infection, pulmonary embolus or other thromboembolic complication, chronic pain, or other complications related to the specific procedure(s) performed.  For transcatheter aortic valve replacement additional risks include but are not limited to risk of paravalvular leak, valve embolization, valve thrombosis, aortic dissection, aortic rupture, ventricular septal defect or perforation, pericardial tamponade, injury of the abdominal aorta or its branches, and/or injury or occlusion of the arteries going to your arms or legs.  Please call to schedule a follow-up  appointment in our office prior to surgery if you have any unresolved questions about your planned surgical procedure, the associated risks, alternative treatment options, and/or expectations for your post-operative recovery.      

## 2021-02-04 NOTE — Addendum Note (Signed)
Addended by: Bobette Mo on: 02/04/2021 11:53 AM   Modules accepted: Orders

## 2021-02-04 NOTE — Therapy (Signed)
[place], [place], [address] Phone: [phone]   Fax:  [phone]  Physical Therapy Pre-TAVR Evaluation  Patient Details  Name: Katherine Waters MRN: [id number] Date of Birth: [birth year] Referring Provider (PT): Sherren Mocha, MD   Encounter Date: 02/04/2021   PT End of Session - 02/04/21 1114    Visit Number 1    Number of Visits 1    Date for PT Re-Evaluation 02/04/21    Authorization Type MCR    PT Start Time 1105    PT Stop Time 1140    PT Time Calculation (min) 35 min    Activity Tolerance Patient tolerated treatment well    Behavior During Therapy [employer] for tasks assessed/performed           Past Medical History:  Diagnosis Date  . Asthma   . Connective tissue disease ([place])   . Hyperlipidemia   . Hypertension   . Severe aortic stenosis     Past Surgical History:  Procedure Laterality Date  . CARDIAC CATHETERIZATION    . LUMBAR FUSION    . RIGHT/LEFT HEART CATH AND CORONARY ANGIOGRAPHY N/A 01/29/2021   Procedure: RIGHT/LEFT HEART CATH AND CORONARY ANGIOGRAPHY;  Surgeon: Belva Crome, MD;  Location: [facility];  Service: Cardiovascular;  Laterality: N/A;  . ROTATOR CUFF REPAIR      There were no vitals filed for this visit.    Subjective Assessment - 02/04/21 1106    Subjective Patient reports main symptom of shortness of breath. She lost 60 lbs last year and was walking up to 2 miles, and then had to take a break from the exercise while she was moving. Patient reports since the move, over the past 3-4 months, she has had trouble returning to walking due to shortness of breath. Patient also reports feeling winded going up/down stairs at home and with doing activities around the house.    Limitations Walking;House hold activities    How long can you sit comfortably? No limitation    How long can you walk comfortably? Approximatley 1 mile, more limited up hill and has to take  breaks    Patient Stated Goals Get heart better    Currently in Pain? No/denies              [facility] PT Assessment - 02/04/21 0001      Assessment   Medical Diagnosis Severe Aortic Stenosis    Referring Provider (PT) Sherren Mocha, MD    Onset Date/Surgical Date --   worsening over past 3-4 months   Hand Dominance Right    Next MD Visit 02/04/2021    Prior Therapy Yes (ankle)      Precautions   Precautions None      Restrictions   Weight Bearing Restrictions No      Balance Screen   Has the patient fallen in the past 6 months No    Has the patient had a decrease in activity level because of a fear of falling?  No    Is the patient reluctant to leave their home because of a fear of falling?  No      Home Social worker Private residence    Living Arrangements Children;Spouse/significant other    Type of [place] Two level      Prior Function   Level of [place] Retired    Leisure Walking  Cognition   Overall Cognitive Status Within Functional Limits for tasks assessed      Observation/Other Assessments   Observations Patient appears in no apparent distress    Focus on Therapeutic Outcomes (FOTO)  NA      Coordination   Gross Motor Movements are Fluid and Coordinated Yes      ROM / Strength   AROM / PROM / Strength AROM;Strength      AROM   Overall AROM Comments BUE and BLE grossly WFL      Strength   Overall Strength Comments BUE and BLE grossly 5/5 MMT    Strength Assessment Site Hand    Right/Left hand Right;Left    Right Hand Grip (lbs) 40, 55, 50    Left Hand Grip (lbs) 38, 35, 40      Transfers   Transfers Independent with all Transfers      Ambulation/Gait   Ambulation/Gait Yes    Ambulation/Gait Assistance 7: Independent    Gait Comments Slight coxalgic gait            [facility] Pre-Surgical Assessment - 02/04/21 0001    5 Meter Walk Test- trial 1 3 sec    5 Meter Walk  Test- trial 2 3 sec.     5 Meter Walk Test- trial 3 4 sec.   3/10 Modified Borg   5 meter walk test average 3.33 sec    4 Stage Balance Test tolerated for:  8 sec.    4 Stage Balance Test Position 3    Sit To Stand Test- trial 1 13 sec.    ADL/IADL Independent with: Bathing;Dressing;Meal prep;Finances    ADL/IADL Needs Assistance with: Valla Leaver work    ADL/IADL Fraility Index Vulnerable    Other comment 10/12 fraility score    6 Minute Walk- Baseline yes    BP (mmHg) 178/59    HR (bpm) 74    02 Sat (%RA) 97 %    Modified Borg Scale for Dyspnea 1- Very mild shortness of breath    Perceived Rate of Exertion (Borg) 7- Very, very light    6 Minute Walk Post Test yes    BP (mmHg) 156/73    HR (bpm) 88    02 Sat (%RA) 96 %    Modified Borg Scale for Dyspnea 4- somewhat severe    Perceived Rate of Exertion (Borg) 15- Hard    Aerobic Endurance Distance Walked 1065    Endurance additional comments no rest breaks required                    Objective measurements completed on examination: See above findings.               PT Education - 02/04/21 1113    Education Details Exam findings, energy conservation    Person(s) Educated Patient    Methods Explanation    Comprehension Verbalized understanding                       Plan - 02/04/21 1115    Clinical Impression Statement See below:    Personal Factors and Comorbidities Time since onset of injury/illness/exacerbation;Past/Current Experience    Examination-Activity Limitations Locomotion Level    Examination-Participation Restrictions Cleaning;Yard Work;Community Activity    Stability/Clinical Decision Making Stable/Uncomplicated    Clinical Decision Making Low    Rehab Potential Excellent    PT Frequency One time visit    PT Treatment/Interventions ADLs/Self Care Home Management;Therapeutic exercise;Gait training;Energy conservation  PT Next Visit Plan NA    PT Home Exercise Plan NA     Consulted and Agree with Plan of Care Patient           Clinical Impression Statement: Pt is a 70 yo female presenting to OP PT for evaluation prior to possible TAVR surgery due to severe aortic stenosis. Pt reports onset of shortness of breath approximately worsening 3-4 months ago. Symptoms are limiting walking for exercise and household tasks. Pt presents with good ROM and strength, moderate balance and is not at high fall risk 4 stage balance test, good walking speed and impaired aerobic endurance per 6 minute walk test. Pt ambulated a total of 1065 feet in 6 minute walk. Shortness of breath and RPE increased significantly with 6 minute walk test. Based on the Short Physical Performance Battery, patient has a frailty rating of 10/12 with </= 5/12 considered frail.   Patient demonstrates the following deficits and impairments:  Difficulty walking,Abnormal gait,Decreased activity tolerance,Cardiopulmonary status limiting activity  Visit Diagnosis: Other abnormalities of gait and mobility     Problem List Patient Active Problem List   Diagnosis Date Noted  . Severe aortic stenosis   . Hypertension   . Connective tissue disease ([place])   . Hyperlipidemia   . Asthma   . Aortic stenosis, severe 01/29/2021    Hilda Blades, PT, DPT, LAT, ATC 02/04/21  11:49 AM Phone: [phone] Fax: [facility] [address] [place], [place], [address] Phone: [phone]   Fax:  [phone]  Name: Katherine Waters MRN: [id number] Date of Birth: [birth year]

## 2021-02-05 ENCOUNTER — Other Ambulatory Visit: Payer: Self-pay

## 2021-02-05 DIAGNOSIS — I350 Nonrheumatic aortic (valve) stenosis: Secondary | ICD-10-CM

## 2021-02-07 ENCOUNTER — Other Ambulatory Visit (HOSPITAL_COMMUNITY): Payer: Medicare Other

## 2021-02-07 NOTE — Progress Notes (Signed)
Surgical Instructions    Your procedure is scheduled on Tuesday 02/12/2021.  Report to [facility] Main Entrance "A" at 05:30 A.M., then check in with the Admitting office.   Call this number if you have problems the morning of surgery:  [phone]    If you have any questions prior to your surgery date call [phone]: Open Monday-Friday 8am-4pm    Remember: Do not eat or drink after midnight the night before your surgery   Continue taking all medications without change through the day before surgery.   On the morning of surgery do not take any medications.          Do NOT Smoke (Tobacco/Vaping) or drink Alcohol 24 hours prior to your procedure  If you use a CPAP at night, you may bring all equipment for your overnight stay.   Contacts, glasses, hearing aids, dentures or partials may not be worn into surgery, please bring cases for these belongings   For patients admitted to the hospital, discharge time will be determined by your treatment team.   Patients discharged the day of surgery will not be allowed to drive home, and someone needs to stay with them for 24 hours.    Special instructions:   [place]- Preparing For Surgery  Before surgery, you can play an important role. Because skin is not sterile, your skin needs to be as free of germs as possible. You can reduce the number of germs on your skin by washing with CHG (chlorahexidine gluconate) Soap before surgery.  CHG is an antiseptic cleaner which kills germs and bonds with the skin to continue killing germs even after washing.    Oral Hygiene is also important to reduce your risk of infection.  Remember - BRUSH YOUR TEETH THE MORNING OF SURGERY WITH YOUR REGULAR TOOTHPASTE  Please do not use if you have an allergy to CHG or antibacterial soaps. If your skin becomes reddened/irritated stop using the CHG.  Do not shave (including legs and underarms) for at least 48 hours prior to first CHG shower. It is OK to shave  your face.  Please follow these instructions carefully.   You are going to shower with CHG soap 2 different times.  The NIGHT BEFORE SURGERY/PROCEDURE and then again the MORNING OF SURGERY/PROCEDURE   1. If you chose to wash your hair, wash your hair first as usual with your normal shampoo.  2. After you shampoo, rinse your hair and body thoroughly to remove the shampoo.  3. Wash Face and genitals (private parts) with your normal soap.   4. THEN Shower with CHG Soap.   5. Use CHG as you would any other liquid soap. You can apply CHG directly to the skin and wash gently with a pouf/sponge or a clean washcloth.   6. Apply the CHG Soap to your body ONLY FROM THE NECK DOWN.  Do not use on open wounds or open sores. Avoid contact with your eyes, ears, mouth and genitals (private parts). Wash Face and genitals (private parts)  with your normal soap.   7. Wash thoroughly, paying special attention to the area where your surgery will be performed.  8. Thoroughly rinse your body with warm water from the neck down.  9. DO NOT shower/wash with your normal soap after using and rinsing off the CHG Soap.  10. Pat yourself dry with a CLEAN TOWEL.  11. Wear CLEAN PAJAMAS to bed the night before surgery  12. Place CLEAN SHEETS on your bed the  night before your surgery  13. DO NOT SLEEP WITH PETS.   Day of Surgery: Shower with CHG soap as directed.  Do not shave 48 hours prior to surgery.  Men may shave face and neck.  Do not wear lotions, powders, perfumes/colognes, or deodorant.  Wear Clean/Comfortable clothing the morning of surgery  Remember to brush your teeth WITH YOUR REGULAR TOOTHPASTE.             Do not wear jewelry, make up, or nail polish  Do not bring valuables to the hospital.             [facility] is not responsible for any belongings or valuables.    Please read over the following fact sheets that you were given.

## 2021-02-08 ENCOUNTER — Encounter (HOSPITAL_COMMUNITY): Payer: Self-pay

## 2021-02-08 ENCOUNTER — Ambulatory Visit (HOSPITAL_COMMUNITY)
Admission: RE | Admit: 2021-02-08 | Discharge: 2021-02-08 | Disposition: A | Discharge status: Home / Self Care | Payer: Medicare Other | Source: Ambulatory Visit | Attending: Cardiovascular Disease | Admitting: Cardiovascular Disease

## 2021-02-08 ENCOUNTER — Other Ambulatory Visit: Payer: Self-pay

## 2021-02-08 ENCOUNTER — Encounter (HOSPITAL_COMMUNITY)
Admission: RE | Admit: 2021-02-08 | Discharge: 2021-02-08 | Disposition: A | Discharge status: Home / Self Care | Payer: Medicare Other | Source: Ambulatory Visit | Attending: Cardiovascular Disease | Admitting: Cardiovascular Disease

## 2021-02-08 DIAGNOSIS — R011 Cardiac murmur, unspecified: Secondary | ICD-10-CM | POA: Insufficient documentation

## 2021-02-08 DIAGNOSIS — Z87891 Personal history of nicotine dependence: Secondary | ICD-10-CM | POA: Insufficient documentation

## 2021-02-08 DIAGNOSIS — E785 Hyperlipidemia, unspecified: Secondary | ICD-10-CM | POA: Diagnosis not present

## 2021-02-08 DIAGNOSIS — Z791 Long term (current) use of non-steroidal anti-inflammatories (NSAID): Secondary | ICD-10-CM | POA: Insufficient documentation

## 2021-02-08 DIAGNOSIS — Z7901 Long term (current) use of anticoagulants: Secondary | ICD-10-CM | POA: Diagnosis not present

## 2021-02-08 DIAGNOSIS — I10 Essential (primary) hypertension: Secondary | ICD-10-CM | POA: Insufficient documentation

## 2021-02-08 DIAGNOSIS — M797 Fibromyalgia: Secondary | ICD-10-CM | POA: Insufficient documentation

## 2021-02-08 DIAGNOSIS — I350 Nonrheumatic aortic (valve) stenosis: Secondary | ICD-10-CM

## 2021-02-08 DIAGNOSIS — Z20822 Contact with and (suspected) exposure to covid-19: Secondary | ICD-10-CM | POA: Insufficient documentation

## 2021-02-08 DIAGNOSIS — Z01818 Encounter for other preprocedural examination: Secondary | ICD-10-CM | POA: Insufficient documentation

## 2021-02-08 DIAGNOSIS — Z79899 Other long term (current) drug therapy: Secondary | ICD-10-CM | POA: Insufficient documentation

## 2021-02-08 DIAGNOSIS — I658 Occlusion and stenosis of other precerebral arteries: Secondary | ICD-10-CM | POA: Diagnosis not present

## 2021-02-08 HISTORY — DX: Fibromyalgia: M79.7

## 2021-02-08 HISTORY — DX: Gastro-esophageal reflux disease without esophagitis: K21.9

## 2021-02-08 HISTORY — DX: Depression, unspecified: F32.A

## 2021-02-08 HISTORY — DX: Myoneural disorder, unspecified: G70.9

## 2021-02-08 HISTORY — DX: Other specified postprocedural states: Z98.890

## 2021-02-08 HISTORY — DX: Nausea with vomiting, unspecified: R11.2

## 2021-02-08 HISTORY — DX: Cardiac murmur, unspecified: R01.1

## 2021-02-08 HISTORY — DX: Unspecified osteoarthritis, unspecified site: M19.90

## 2021-02-08 LAB — BLOOD GAS, ARTERIAL
Acid-Base Excess: 2.9 mmol/L — ABNORMAL HIGH (ref 0.0–2.0)
Bicarbonate: 26.8 mmol/L (ref 20.0–28.0)
FIO2: 21
O2 Saturation: 97.7 %
Patient temperature: 37
pCO2 arterial: 40 mmHg (ref 32.0–48.0)
pH, Arterial: 7.441 (ref 7.350–7.450)
pO2, Arterial: 97.9 mmHg (ref 83.0–108.0)

## 2021-02-08 LAB — CBC
HCT: 41.9 % (ref 36.0–46.0)
Hemoglobin: 14 g/dL (ref 12.0–15.0)
MCH: 31.3 pg (ref 26.0–34.0)
MCHC: 33.4 g/dL (ref 30.0–36.0)
MCV: 93.7 fL (ref 80.0–100.0)
Platelets: 292 10*3/uL (ref 150–400)
RBC: 4.47 MIL/uL (ref 3.87–5.11)
RDW: 12.7 % (ref 11.5–15.5)
WBC: 7.2 10*3/uL (ref 4.0–10.5)
nRBC: 0 % (ref 0.0–0.2)

## 2021-02-08 LAB — URINALYSIS, ROUTINE W REFLEX MICROSCOPIC
Bacteria, UA: NONE SEEN
Bilirubin Urine: NEGATIVE
Glucose, UA: NEGATIVE mg/dL
Hgb urine dipstick: NEGATIVE
Ketones, ur: NEGATIVE mg/dL
Leukocytes,Ua: NEGATIVE
Nitrite: NEGATIVE
Protein, ur: NEGATIVE mg/dL
Specific Gravity, Urine: 1.019 (ref 1.005–1.030)
pH: 5 (ref 5.0–8.0)

## 2021-02-08 LAB — TYPE AND SCREEN
ABO/RH(D): O NEG
Antibody Screen: NEGATIVE

## 2021-02-08 LAB — COMPREHENSIVE METABOLIC PANEL
ALT: 20 U/L (ref 0–44)
AST: 22 U/L (ref 15–41)
Albumin: 4 g/dL (ref 3.5–5.0)
Alkaline Phosphatase: 59 U/L (ref 38–126)
Anion gap: 11 (ref 5–15)
BUN: 21 mg/dL (ref 8–23)
CO2: 25 mmol/L (ref 22–32)
Calcium: 9.5 mg/dL (ref 8.9–10.3)
Chloride: 103 mmol/L (ref 98–111)
Creatinine, Ser: 0.71 mg/dL (ref 0.44–1.00)
GFR, Estimated: >60 mL/min (ref >60)
Glucose, Bld: 84 mg/dL (ref 70–99)
Potassium: 3.6 mmol/L (ref 3.5–5.1)
Sodium: 139 mmol/L (ref 135–145)
Total Bilirubin: 0.8 mg/dL (ref 0.3–1.2)
Total Protein: 6.8 g/dL (ref 6.5–8.1)

## 2021-02-08 LAB — PROTIME-INR
INR: 1 (ref 0.8–1.2)
Prothrombin Time: 12.9 seconds (ref 11.4–15.2)

## 2021-02-08 LAB — SARS CORONAVIRUS 2 (TAT 6-24 HRS): SARS Coronavirus 2: NEGATIVE

## 2021-02-08 LAB — SURGICAL PCR SCREEN
MRSA, PCR: NEGATIVE
Staphylococcus aureus: NEGATIVE

## 2021-02-08 NOTE — Progress Notes (Signed)
PCP - Maude Leriche MD Cardiologist -   PPM/ICD -denies  -   Chest x-ray - 02/08/2021 EKG - 02/08/2021 Stress Test -01/12/18 ECHO - 01/15/21 Cardiac Cath -01/29/21   Sleep Study -2019  CPAP - does not use. States she threw it away.  Fasting Blood Sugar - Na  Checks Blood Sugar na_____ times a day  Blood Thinner Instructions:na Aspirin Instructions:Continue taking all meds until day before surgery  ERAS Protcol -na  PRE-SURGERY Ensure or G2- na  COVID TEST-02/08/2021    Anesthesia review: yes,cardiac history  Patient denies shortness of breath, fever, cough and chest pain at PAT appointment   All instructions explained to the patient, with a verbal understanding of the material. Patient agrees to go over the instructions while at home for a better understanding. Patient also instructed to self quarantine after being tested for COVID-19. The opportunity to ask questions was provided.

## 2021-02-11 MED ORDER — DEXMEDETOMIDINE HCL IN NACL 400 MCG/100ML IV SOLN
0.1000 ug/kg/h | INTRAVENOUS | Status: AC
  Administered 2021-02-12: 1 ug/kg/h via INTRAVENOUS
  Filled 2021-02-11: qty 100

## 2021-02-11 MED ORDER — POTASSIUM CHLORIDE 2 MEQ/ML IV SOLN
80.0000 meq | INTRAVENOUS | Status: DC
  Filled 2021-02-11: qty 40

## 2021-02-11 MED ORDER — NOREPINEPHRINE 4 MG/250ML-% IV SOLN
0.0000 ug/min | INTRAVENOUS | Status: AC
  Administered 2021-02-12: 6 ug/min via INTRAVENOUS
  Filled 2021-02-11: qty 250

## 2021-02-11 MED ORDER — SODIUM CHLORIDE 0.9 % IV SOLN
1.5000 g | INTRAVENOUS | Status: AC
  Administered 2021-02-12: 1.5 g via INTRAVENOUS
  Filled 2021-02-11: qty 1.5

## 2021-02-11 MED ORDER — MAGNESIUM SULFATE 50 % IJ SOLN
40.0000 meq | INTRAMUSCULAR | Status: DC
  Filled 2021-02-11: qty 9.85

## 2021-02-11 MED ORDER — SODIUM CHLORIDE 0.9 % IV SOLN
INTRAVENOUS | Status: DC
  Filled 2021-02-11: qty 30

## 2021-02-11 MED ORDER — VANCOMYCIN HCL 1500 MG/300ML IV SOLN
1500.0000 mg | INTRAVENOUS | Status: AC
  Administered 2021-02-12: 1500 mg via INTRAVENOUS
  Filled 2021-02-11: qty 300

## 2021-02-11 MED ORDER — VANCOMYCIN HCL 1250 MG/250ML IV SOLN
1250.0000 mg | INTRAVENOUS | Status: DC
  Filled 2021-02-11: qty 250

## 2021-02-11 NOTE — Anesthesia Preprocedure Evaluation (Addendum)
Anesthesia Evaluation  Patient identified by MRN, date of birth, ID band Patient awake    Reviewed: Allergy & Precautions, H&P , NPO status , Patient's Chart, lab work & pertinent test results  History of Anesthesia Complications (+) PONV and history of anesthetic complications  Airway Mallampati: II   Neck ROM: full    Dental   Pulmonary asthma , former smoker,    breath sounds clear to auscultation       Cardiovascular hypertension, + Valvular Problems/Murmurs AS  Rhythm:regular Rate:Normal  Severe AS. Mean PG 69 mmHg.  EF 60%   Neuro/Psych PSYCHIATRIC DISORDERS Depression  Neuromuscular disease    GI/Hepatic GERD  ,  Endo/Other  Morbid obesity  Renal/GU      Musculoskeletal  (+) Arthritis , Fibromyalgia -  Abdominal   Peds  Hematology   Anesthesia Other Findings   Reproductive/Obstetrics                            Anesthesia Physical Anesthesia Plan  ASA: III  Anesthesia Plan: MAC   Post-op Pain Management:    Induction: Intravenous  PONV Risk Score and Plan: 3 and Propofol infusion, Ondansetron and Treatment may vary due to age or medical condition  Airway Management Planned: Simple Face Mask  Additional Equipment:   Intra-op Plan:   Post-operative Plan:   Informed Consent: I have reviewed the patients History and Physical, chart, labs and discussed the procedure including the risks, benefits and alternatives for the proposed anesthesia with the patient or authorized representative who has indicated his/her understanding and acceptance.     Dental advisory given  Plan Discussed with: CRNA, Anesthesiologist and Surgeon  Anesthesia Plan Comments: (PAT note written 02/11/2021 by Myra Gianotti, PA-C. )       Anesthesia Quick Evaluation

## 2021-02-11 NOTE — Progress Notes (Signed)
Anesthesia Chart Review:  Case: [id number] Date/Time: 02/12/21 0715   Procedures:      TRANSCATHETER AORTIC VALVE REPLACEMENT, TRANSFEMORAL (N/A Chest)     TRANSESOPHAGEAL ECHOCARDIOGRAM (TEE) (N/A )   Anesthesia type: Monitor Anesthesia Care   Pre-op diagnosis: Severe Aortic Stenosis   Location: [place] OR ROOM 16 / [facility]   Surgeons: Sherren Mocha, MD    CT Surgeon: Darylene Price, MD   DISCUSSION: Patient is a 70 year old femae scheduled dfor the above procedure.  History includes former smoker (quit 10/20/74), post-operative N/V, asthma, HTN, HLD, murmur/severe AS, Raynaud's Syndrome, fibromyalgia, lumbar fusion, hysterectomy. Severe AS with widely patent coronaries on 01/29/21 cath. 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 BMI is consistent with morbid obesity.   02/08/21 presurgical COVID-19 test negative. Anesthesia team to evaluate on the day of surgery.    VS: BP 128/65   Pulse 82   Temp 36.8 C (Oral)   Resp 18   Ht 5\' 2"  (1.575 m)   Wt 102.3 kg   SpO2 100%   BMI 41.24 kg/m    PROVIDERS: Scifres, Earlie Server, PA-C is PCP  Eleonore Chiquito, MD is primary cardiologist. Sherren Mocha, MD is multidisciplinary heart valve team cardiologist.   LABS: Labs reviewed: Acceptable for surgery. (all labs ordered are listed, but only abnormal results are displayed)  Labs Reviewed  BLOOD GAS, ARTERIAL - Abnormal; Notable for the following components:      Result Value   Acid-Base Excess 2.9 (*)    Allens test (pass/fail) BRACHIAL ARTERY (*)    All other components within normal limits  SARS CORONAVIRUS 2 (TAT 6-24 HRS)  SURGICAL PCR SCREEN  CBC  COMPREHENSIVE METABOLIC PANEL  PROTIME-INR  URINALYSIS, ROUTINE W REFLEX MICROSCOPIC  TYPE AND SCREEN     IMAGES: CXR 02/08/21: FINDINGS: Mild scar or atelectasis in the bases. The heart, hila, mediastinum, lungs, and pleura are otherwise unremarkable. A density projects over the inferior posterior chest on the  lateral view was noted to represent a fat pad based on previous CT imaging. IMPRESSION: No significant abnormalities.  CTA chest/abd/pelvis 02/01/21: IMPRESSION: 1. Vascular findings and measurements pertinent to potential TAVR procedure, as detailed above. 2. Severe thickening calcification of the aortic valve, compatible with reported clinical history of severe aortic stenosis. 3. Horseshoe kidney, normal anatomical variant, incidentally noted. 4. Colonic diverticulosis without evidence of acute diverticulitis at this time. 5. Additional incidental findings, as above.[See full report]   EKG: 02/08/21: Normal sinus rhythm Possible Anterior infarct , age undetermined T wave abnormality, consider lateral ischemia Abnormal ECG No previous tracing Confirmed by Camnitz, Will [phone]) on 02/08/2021 8:56:07 PM   CV: Noted under Results Review tab and outlined in 02/04/21 note by Dr. Roxy Manns.  US Carotid 02/01/21: Summary:  Right Carotid: Velocities in the right ICA are consistent with a 1-39%  stenosis.  Left Carotid: Velocities in the left ICA are consistent with a 1-39%  stenosis.  Vertebrals: Bilateral vertebral arteries demonstrate antegrade flow.    Past Medical History:  Diagnosis Date  . Arthritis    osteoarthritis  . Asthma   . Connective tissue disease ([place])   . Depression   . Fibromyalgia   . GERD (gastroesophageal reflux disease)   . Heart murmur   . Hyperlipidemia   . Hypertension   . Neuromuscular disorder ([place])    Raynauds  . PONV (postoperative nausea and vomiting)   . Severe aortic stenosis     Past Surgical History:  Procedure Laterality  Date  . ABDOMINAL HYSTERECTOMY    . CARDIAC CATHETERIZATION    . CATARACT EXTRACTION, BILATERAL    . EYE SURGERY    . LUMBAR FUSION    . POSTERIOR TIBIAL TENDON REPAIR    . RIGHT/LEFT HEART CATH AND CORONARY ANGIOGRAPHY N/A 01/29/2021   Procedure: RIGHT/LEFT HEART CATH AND CORONARY ANGIOGRAPHY;  Surgeon: Belva Crome, MD;  Location: [facility];  Service: Cardiovascular;  Laterality: N/A;  . ROTATOR CUFF REPAIR    . TUBAL LIGATION      MEDICATIONS: . acetaminophen (TYLENOL) 500 MG tablet  . amLODipine (NORVASC) 10 MG tablet  . Apple Cider Vinegar 500 MG TABS  . Bioflavonoid Products (ESTER C PO)  . Cholecalciferol (VITAMIN D) 50 MCG (2000 UT) tablet  . Cyanocobalamin (B-12) 500 MCG SUBL  . DULoxetine (CYMBALTA) 60 MG capsule  . indapamide (LOZOL) 2.5 MG tablet  . Magnesium 100 MG CAPS  . meloxicam (MOBIC) 15 MG tablet  . omeprazole (PRILOSEC) 40 MG capsule  . rosuvastatin (CRESTOR) 10 MG tablet  . telmisartan (MICARDIS) 80 MG tablet  . Turmeric 500 MG CAPS   No current facility-administered medications for this encounter.   Derrill Memo ON 02/12/2021] cefUROXime (ZINACEF) 1.5 g in sodium chloride 0.9 % 100 mL IVPB  . [START ON 02/12/2021] dexmedetomidine (PRECEDEX) 400 MCG/100ML (4 mcg/mL) infusion  . [START ON 02/12/2021] heparin 30,000 units/NS 1000 mL solution for CELLSAVER  . [START ON 02/12/2021] magnesium sulfate (IV Push/IM) injection 40 mEq  . [START ON 02/12/2021] norepinephrine (LEVOPHED) 4mg  in 243mL premix infusion  . [START ON 02/12/2021] potassium chloride injection 80 mEq  . [START ON 02/12/2021] vancomycin (VANCOREADY) IVPB 1500 mg/300 mL    Myra Gianotti, PA-C Surgical Short Stay/Anesthesiology [facility] Phone [phone] [facility] Phone [phone] 02/11/2021 10:48 AM

## 2021-02-12 ENCOUNTER — Other Ambulatory Visit: Payer: Self-pay | Admitting: Physician Assistant

## 2021-02-12 ENCOUNTER — Inpatient Hospital Stay (HOSPITAL_COMMUNITY): Payer: Medicare Other | Admitting: Anesthesiology

## 2021-02-12 ENCOUNTER — Inpatient Hospital Stay (HOSPITAL_COMMUNITY)
Admission: RE | Admit: 2021-02-12 | Discharge: 2021-02-13 | DRG: 267 | Disposition: A | Discharge status: Home / Self Care | Payer: Medicare Other | Attending: Cardiovascular Disease | Admitting: Cardiovascular Disease

## 2021-02-12 ENCOUNTER — Encounter (HOSPITAL_COMMUNITY)
Admission: RE | Disposition: A | Discharge status: Home / Self Care | Payer: Self-pay | Source: Home / Self Care | Attending: Cardiovascular Disease

## 2021-02-12 ENCOUNTER — Other Ambulatory Visit: Payer: Self-pay

## 2021-02-12 ENCOUNTER — Ambulatory Visit (HOSPITAL_COMMUNITY): Payer: Medicare Other

## 2021-02-12 ENCOUNTER — Encounter (HOSPITAL_COMMUNITY): Payer: Self-pay | Admitting: Cardiovascular Disease

## 2021-02-12 DIAGNOSIS — Z882 Allergy status to sulfonamides status: Secondary | ICD-10-CM | POA: Diagnosis not present

## 2021-02-12 DIAGNOSIS — K219 Gastro-esophageal reflux disease without esophagitis: Secondary | ICD-10-CM | POA: Diagnosis present

## 2021-02-12 DIAGNOSIS — I350 Nonrheumatic aortic (valve) stenosis: Secondary | ICD-10-CM | POA: Diagnosis present

## 2021-02-12 DIAGNOSIS — F32A Depression, unspecified: Secondary | ICD-10-CM | POA: Diagnosis present

## 2021-02-12 DIAGNOSIS — M1990 Unspecified osteoarthritis, unspecified site: Secondary | ICD-10-CM | POA: Diagnosis present

## 2021-02-12 DIAGNOSIS — E785 Hyperlipidemia, unspecified: Secondary | ICD-10-CM | POA: Diagnosis present

## 2021-02-12 DIAGNOSIS — I442 Atrioventricular block, complete: Secondary | ICD-10-CM | POA: Diagnosis present

## 2021-02-12 DIAGNOSIS — Z888 Allergy status to other drugs, medicaments and biological substances status: Secondary | ICD-10-CM | POA: Diagnosis not present

## 2021-02-12 DIAGNOSIS — I700 Atherosclerosis of aorta: Secondary | ICD-10-CM | POA: Diagnosis present

## 2021-02-12 DIAGNOSIS — J45909 Unspecified asthma, uncomplicated: Secondary | ICD-10-CM | POA: Diagnosis present

## 2021-02-12 DIAGNOSIS — Z981 Arthrodesis status: Secondary | ICD-10-CM | POA: Diagnosis not present

## 2021-02-12 DIAGNOSIS — Z91040 Latex allergy status: Secondary | ICD-10-CM | POA: Diagnosis not present

## 2021-02-12 DIAGNOSIS — I7300 Raynaud's syndrome without gangrene: Secondary | ICD-10-CM | POA: Diagnosis present

## 2021-02-12 DIAGNOSIS — Z791 Long term (current) use of non-steroidal anti-inflammatories (NSAID): Secondary | ICD-10-CM

## 2021-02-12 DIAGNOSIS — M797 Fibromyalgia: Secondary | ICD-10-CM | POA: Diagnosis present

## 2021-02-12 DIAGNOSIS — Z79899 Other long term (current) drug therapy: Secondary | ICD-10-CM | POA: Diagnosis not present

## 2021-02-12 DIAGNOSIS — I10 Essential (primary) hypertension: Secondary | ICD-10-CM | POA: Diagnosis present

## 2021-02-12 DIAGNOSIS — Z8249 Family history of ischemic heart disease and other diseases of the circulatory system: Secondary | ICD-10-CM

## 2021-02-12 DIAGNOSIS — Z6841 Body Mass Index (BMI) 40.0 and over, adult: Secondary | ICD-10-CM | POA: Diagnosis not present

## 2021-02-12 DIAGNOSIS — Z006 Encounter for examination for normal comparison and control in clinical research program: Secondary | ICD-10-CM | POA: Diagnosis not present

## 2021-02-12 DIAGNOSIS — Z87891 Personal history of nicotine dependence: Secondary | ICD-10-CM | POA: Diagnosis not present

## 2021-02-12 DIAGNOSIS — Z952 Presence of prosthetic heart valve: Secondary | ICD-10-CM

## 2021-02-12 DIAGNOSIS — M359 Systemic involvement of connective tissue, unspecified: Secondary | ICD-10-CM | POA: Diagnosis present

## 2021-02-12 DIAGNOSIS — Z825 Family history of asthma and other chronic lower respiratory diseases: Secondary | ICD-10-CM

## 2021-02-12 DIAGNOSIS — Z809 Family history of malignant neoplasm, unspecified: Secondary | ICD-10-CM

## 2021-02-12 HISTORY — DX: Raynaud's syndrome without gangrene: I73.00

## 2021-02-12 HISTORY — PX: INTRAOPERATIVE TRANSTHORACIC ECHOCARDIOGRAM: SHX6523

## 2021-02-12 HISTORY — PX: ULTRASOUND GUIDANCE FOR VASCULAR ACCESS: SHX6516

## 2021-02-12 HISTORY — DX: Presence of prosthetic heart valve: Z95.2

## 2021-02-12 HISTORY — PX: TRANSCATHETER AORTIC VALVE REPLACEMENT, TRANSFEMORAL: SHX6400

## 2021-02-12 LAB — POCT I-STAT, CHEM 8
BUN: 24 mg/dL — ABNORMAL HIGH (ref 8–23)
BUN: 22 mg/dL (ref 8–23)
BUN: 22 mg/dL (ref 8–23)
BUN: 21 mg/dL (ref 8–23)
Calcium, Ion: 1.28 mmol/L (ref 1.15–1.40)
Calcium, Ion: 1.25 mmol/L (ref 1.15–1.40)
Calcium, Ion: 1.22 mmol/L (ref 1.15–1.40)
Calcium, Ion: 1.28 mmol/L (ref 1.15–1.40)
Chloride: 103 mmol/L (ref 98–111)
Chloride: 104 mmol/L (ref 98–111)
Chloride: 104 mmol/L (ref 98–111)
Chloride: 103 mmol/L (ref 98–111)
Creatinine, Ser: 0.6 mg/dL (ref 0.44–1.00)
Creatinine, Ser: 0.6 mg/dL (ref 0.44–1.00)
Creatinine, Ser: 0.6 mg/dL (ref 0.44–1.00)
Creatinine, Ser: 0.6 mg/dL (ref 0.44–1.00)
Glucose, Bld: 110 mg/dL — ABNORMAL HIGH (ref 70–99)
Glucose, Bld: 128 mg/dL — ABNORMAL HIGH (ref 70–99)
Glucose, Bld: 140 mg/dL — ABNORMAL HIGH (ref 70–99)
Glucose, Bld: 106 mg/dL — ABNORMAL HIGH (ref 70–99)
HCT: 36 % (ref 36.0–46.0)
HCT: 33 % — ABNORMAL LOW (ref 36.0–46.0)
HCT: 32 % — ABNORMAL LOW (ref 36.0–46.0)
HCT: 34 % — ABNORMAL LOW (ref 36.0–46.0)
Hemoglobin: 12.2 g/dL (ref 12.0–15.0)
Hemoglobin: 11.2 g/dL — ABNORMAL LOW (ref 12.0–15.0)
Hemoglobin: 10.9 g/dL — ABNORMAL LOW (ref 12.0–15.0)
Hemoglobin: 11.6 g/dL — ABNORMAL LOW (ref 12.0–15.0)
Potassium: 3.7 mmol/L (ref 3.5–5.1)
Potassium: 3.7 mmol/L (ref 3.5–5.1)
Potassium: 3.7 mmol/L (ref 3.5–5.1)
Potassium: 3.4 mmol/L — ABNORMAL LOW (ref 3.5–5.1)
Sodium: 141 mmol/L (ref 135–145)
Sodium: 141 mmol/L (ref 135–145)
Sodium: 141 mmol/L (ref 135–145)
Sodium: 143 mmol/L (ref 135–145)
TCO2: 27 mmol/L (ref 22–32)
TCO2: 27 mmol/L (ref 22–32)
TCO2: 26 mmol/L (ref 22–32)
TCO2: 24 mmol/L (ref 22–32)

## 2021-02-12 LAB — POCT I-STAT 7, (LYTES, BLD GAS, ICA,H+H)
Acid-Base Excess: 1 mmol/L (ref 0.0–2.0)
Bicarbonate: 27.1 mmol/L (ref 20.0–28.0)
Calcium, Ion: 1.27 mmol/L (ref 1.15–1.40)
HCT: 38 % (ref 36.0–46.0)
Hemoglobin: 12.9 g/dL (ref 12.0–15.0)
O2 Saturation: 100 %
Potassium: 3.6 mmol/L (ref 3.5–5.1)
Sodium: 142 mmol/L (ref 135–145)
TCO2: 29 mmol/L (ref 22–32)
pCO2 arterial: 46 mmHg (ref 32.0–48.0)
pH, Arterial: 7.379 (ref 7.350–7.450)
pO2, Arterial: 197 mmHg — ABNORMAL HIGH (ref 83.0–108.0)

## 2021-02-12 LAB — ABO/RH: ABO/RH(D): O NEG

## 2021-02-12 SURGERY — IMPLANTATION, AORTIC VALVE, TRANSCATHETER, FEMORAL APPROACH
Anesthesia: Monitor Anesthesia Care | Site: Groin

## 2021-02-12 MED ORDER — FENTANYL CITRATE (PF) 250 MCG/5ML IJ SOLN
INTRAMUSCULAR | Status: AC
  Filled 2021-02-12: qty 5

## 2021-02-12 MED ORDER — SODIUM CHLORIDE 0.9% FLUSH
3.0000 mL | Freq: Two times a day (BID) | INTRAVENOUS | Status: DC
  Administered 2021-02-12 – 2021-02-13 (×2): 3 mL via INTRAVENOUS

## 2021-02-12 MED ORDER — VANCOMYCIN HCL IN DEXTROSE 1-5 GM/200ML-% IV SOLN
1000.0000 mg | Freq: Once | INTRAVENOUS | Status: AC
  Administered 2021-02-12: 1000 mg via INTRAVENOUS
  Filled 2021-02-12: qty 200

## 2021-02-12 MED ORDER — PROPOFOL 500 MG/50ML IV EMUL
INTRAVENOUS | Status: DC | PRN
  Administered 2021-02-12: 10 ug/kg/min via INTRAVENOUS

## 2021-02-12 MED ORDER — PHENYLEPHRINE HCL-NACL 10-0.9 MG/250ML-% IV SOLN: 0.0000 ug/min | INTRAVENOUS | Status: DC

## 2021-02-12 MED ORDER — PHENYLEPHRINE HCL-NACL 20-0.9 MG/250ML-% IV SOLN: 0.0000 ug/min | INTRAVENOUS | Status: DC

## 2021-02-12 MED ORDER — OXYCODONE HCL 5 MG PO TABS: 5.0000 mg | ORAL_TABLET | ORAL | Status: DC | PRN

## 2021-02-12 MED ORDER — PHENYLEPHRINE 40 MCG/ML (10ML) SYRINGE FOR IV PUSH (FOR BLOOD PRESSURE SUPPORT)
PREFILLED_SYRINGE | INTRAVENOUS | Status: DC | PRN
  Administered 2021-02-12 (×3): 40 ug via INTRAVENOUS

## 2021-02-12 MED ORDER — MIDAZOLAM HCL 5 MG/5ML IJ SOLN
INTRAMUSCULAR | Status: DC | PRN
  Administered 2021-02-12: 2 mg via INTRAVENOUS

## 2021-02-12 MED ORDER — LIDOCAINE HCL 1 % IJ SOLN
INTRAMUSCULAR | Status: DC | PRN
  Administered 2021-02-12: 10 mL

## 2021-02-12 MED ORDER — SODIUM CHLORIDE 0.9 % IV SOLN: INTRAVENOUS | Status: DC

## 2021-02-12 MED ORDER — PHENYLEPHRINE HCL-NACL 10-0.9 MG/250ML-% IV SOLN
INTRAVENOUS | Status: DC | PRN
  Administered 2021-02-12: 20 ug/min via INTRAVENOUS

## 2021-02-12 MED ORDER — PANTOPRAZOLE SODIUM 40 MG PO TBEC
40.0000 mg | DELAYED_RELEASE_TABLET | Freq: Every day | ORAL | Status: DC
  Administered 2021-02-12 – 2021-02-13 (×2): 40 mg via ORAL
  Filled 2021-02-12 (×2): qty 1

## 2021-02-12 MED ORDER — ASPIRIN 81 MG PO CHEW
81.0000 mg | CHEWABLE_TABLET | Freq: Every day | ORAL | Status: DC
  Administered 2021-02-13: 81 mg via ORAL
  Filled 2021-02-12: qty 1

## 2021-02-12 MED ORDER — POTASSIUM CHLORIDE CRYS ER 20 MEQ PO TBCR
40.0000 meq | EXTENDED_RELEASE_TABLET | Freq: Once | ORAL | Status: AC
  Administered 2021-02-12: 40 meq via ORAL
  Filled 2021-02-12: qty 2

## 2021-02-12 MED ORDER — CHLORHEXIDINE GLUCONATE CLOTH 2 % EX PADS: 6.0000 | MEDICATED_PAD | Freq: Every day | CUTANEOUS | Status: DC

## 2021-02-12 MED ORDER — CHLORHEXIDINE GLUCONATE 4 % EX LIQD: 60.0000 mL | Freq: Once | CUTANEOUS | Status: DC

## 2021-02-12 MED ORDER — TRAMADOL HCL 50 MG PO TABS
50.0000 mg | ORAL_TABLET | ORAL | Status: DC | PRN
  Administered 2021-02-13: 100 mg via ORAL
  Filled 2021-02-12: qty 2

## 2021-02-12 MED ORDER — SODIUM CHLORIDE 0.9 % IV SOLN
1.5000 g | Freq: Two times a day (BID) | INTRAVENOUS | Status: DC
  Administered 2021-02-12 – 2021-02-13 (×3): 1.5 g via INTRAVENOUS
  Filled 2021-02-12 (×3): qty 1.5

## 2021-02-12 MED ORDER — SODIUM CHLORIDE 0.9 % IV SOLN: INTRAVENOUS | Status: AC

## 2021-02-12 MED ORDER — CHLORHEXIDINE GLUCONATE 4 % EX LIQD: 30.0000 mL | CUTANEOUS | Status: DC

## 2021-02-12 MED ORDER — PROPOFOL 10 MG/ML IV BOLUS
INTRAVENOUS | Status: DC | PRN
  Administered 2021-02-12 (×2): 10 mg via INTRAVENOUS

## 2021-02-12 MED ORDER — PROPOFOL 10 MG/ML IV BOLUS
INTRAVENOUS | Status: AC
  Filled 2021-02-12: qty 20

## 2021-02-12 MED ORDER — NITROGLYCERIN IN D5W 200-5 MCG/ML-% IV SOLN: 0.0000 ug/min | INTRAVENOUS | Status: DC

## 2021-02-12 MED ORDER — SODIUM CHLORIDE 0.9 % IV SOLN: 250.0000 mL | INTRAVENOUS | Status: DC | PRN

## 2021-02-12 MED ORDER — SODIUM CHLORIDE 0.9 % IV SOLN
INTRAVENOUS | Status: DC | PRN
  Administered 2021-02-12 (×2): 500 mL

## 2021-02-12 MED ORDER — ROSUVASTATIN CALCIUM 5 MG PO TABS
10.0000 mg | ORAL_TABLET | Freq: Every day | ORAL | Status: DC
  Administered 2021-02-12 – 2021-02-13 (×2): 10 mg via ORAL
  Filled 2021-02-12 (×2): qty 2

## 2021-02-12 MED ORDER — DULOXETINE HCL 60 MG PO CPEP
60.0000 mg | ORAL_CAPSULE | Freq: Every day | ORAL | Status: DC
  Administered 2021-02-12 – 2021-02-13 (×2): 60 mg via ORAL
  Filled 2021-02-12 (×2): qty 1

## 2021-02-12 MED ORDER — LACTATED RINGERS IV SOLN: INTRAVENOUS | Status: DC | PRN

## 2021-02-12 MED ORDER — CHLORHEXIDINE GLUCONATE 0.12 % MT SOLN
15.0000 mL | Freq: Once | OROMUCOSAL | Status: AC
  Administered 2021-02-12: 15 mL via OROMUCOSAL
  Filled 2021-02-12: qty 15

## 2021-02-12 MED ORDER — SODIUM CHLORIDE 0.9 % IV SOLN: 250.0000 mL | INTRAVENOUS | Status: DC

## 2021-02-12 MED ORDER — CLOPIDOGREL BISULFATE 75 MG PO TABS
75.0000 mg | ORAL_TABLET | Freq: Every day | ORAL | Status: DC
Start: 2021-02-13 — End: 2021-02-13
  Administered 2021-02-13: 75 mg via ORAL
  Filled 2021-02-12: qty 1

## 2021-02-12 MED ORDER — ONDANSETRON HCL 4 MG/2ML IJ SOLN: 4.0000 mg | Freq: Four times a day (QID) | INTRAMUSCULAR | Status: DC | PRN

## 2021-02-12 MED ORDER — MORPHINE SULFATE (PF) 2 MG/ML IV SOLN: 1.0000 mg | INTRAVENOUS | Status: DC | PRN

## 2021-02-12 MED ORDER — PROTAMINE SULFATE 10 MG/ML IV SOLN
INTRAVENOUS | Status: DC | PRN
  Administered 2021-02-12: 130 mg via INTRAVENOUS

## 2021-02-12 MED ORDER — ACETAMINOPHEN 650 MG RE SUPP: 650.0000 mg | Freq: Four times a day (QID) | RECTAL | Status: DC | PRN

## 2021-02-12 MED ORDER — HEPARIN SODIUM (PORCINE) 1000 UNIT/ML IJ SOLN
INTRAMUSCULAR | Status: DC | PRN
  Administered 2021-02-12: 13000 [IU] via INTRAVENOUS

## 2021-02-12 MED ORDER — IODIXANOL 320 MG/ML IV SOLN
INTRAVENOUS | Status: DC | PRN
  Administered 2021-02-12: 40 mL via INTRA_ARTERIAL

## 2021-02-12 MED ORDER — MIDAZOLAM HCL 2 MG/2ML IJ SOLN
INTRAMUSCULAR | Status: AC
  Filled 2021-02-12: qty 2

## 2021-02-12 MED ORDER — SODIUM CHLORIDE 0.9% FLUSH
3.0000 mL | INTRAVENOUS | Status: DC | PRN
  Administered 2021-02-13: 3 mL via INTRAVENOUS

## 2021-02-12 MED ORDER — FENTANYL CITRATE (PF) 100 MCG/2ML IJ SOLN
INTRAMUSCULAR | Status: DC | PRN
  Administered 2021-02-12: 50 ug via INTRAVENOUS

## 2021-02-12 MED ORDER — ACETAMINOPHEN 325 MG PO TABS
650.0000 mg | ORAL_TABLET | Freq: Four times a day (QID) | ORAL | Status: DC | PRN
  Administered 2021-02-13: 650 mg via ORAL
  Filled 2021-02-12: qty 2

## 2021-02-12 SURGICAL SUPPLY — 87 items
BAG DECANTER FOR FLEXI CONT (MISCELLANEOUS) IMPLANT
BAG SNAP BAND KOVER 36X36 (MISCELLANEOUS) ×5 IMPLANT
BALLN TRUE 18X4.5 (BALLOONS) ×5
BALLOON TRUE 18X4.5 (BALLOONS) ×4 IMPLANT
BLADE CLIPPER SURG (BLADE) ×5 IMPLANT
BLADE STERNUM SYSTEM 6 (BLADE) IMPLANT
BLADE SURG 10 STRL SS (BLADE) IMPLANT
CABLE ADAPT CONN TEMP 6FT (ADAPTER) ×5 IMPLANT
CANISTER SUCT 3000ML PPV (MISCELLANEOUS) IMPLANT
CATH DIAG EXPO 6F AL1 (CATHETERS) ×5 IMPLANT
CATH DIAG EXPO 6F VENT PIG 145 (CATHETERS) ×10 IMPLANT
CATH EXTERNAL FEMALE PUREWICK (CATHETERS) IMPLANT
CATH INFINITI 6F AL2 (CATHETERS) IMPLANT
CATH S G BIP PACING (CATHETERS) ×5 IMPLANT
CHLORAPREP W/TINT 26 (MISCELLANEOUS) ×5 IMPLANT
CLIP VESOCCLUDE MED 24/CT (CLIP) IMPLANT
CLIP VESOCCLUDE SM WIDE 24/CT (CLIP) IMPLANT
CLOSURE MYNX CONTROL 6F/7F (Vascular Products) ×5 IMPLANT
CNTNR URN SCR LID CUP LEK RST (MISCELLANEOUS) ×8 IMPLANT
CONT SPEC 4OZ STRL OR WHT (MISCELLANEOUS) ×2
COVER BACK TABLE 80X110 HD (DRAPES) IMPLANT
COVER WAND RF STERILE (DRAPES) IMPLANT
DECANTER SPIKE VIAL GLASS SM (MISCELLANEOUS) ×5 IMPLANT
DERMABOND ADVANCED (GAUZE/BANDAGES/DRESSINGS) ×1
DERMABOND ADVANCED .7 DNX12 (GAUZE/BANDAGES/DRESSINGS) ×4 IMPLANT
DEVICE CLOSURE PERCLS PRGLD 6F (VASCULAR PRODUCTS) ×12 IMPLANT
DRAPE INCISE IOBAN 66X45 STRL (DRAPES) IMPLANT
DRSG TEGADERM 4X4.75 (GAUZE/BANDAGES/DRESSINGS) ×10 IMPLANT
ELECT CAUTERY BLADE 6.4 (BLADE) IMPLANT
ELECT REM PT RETURN 9FT ADLT (ELECTROSURGICAL) ×10
ELECTRODE REM PT RTRN 9FT ADLT (ELECTROSURGICAL) ×8 IMPLANT
FELT TEFLON 6X6 (MISCELLANEOUS) IMPLANT
GAUZE SPONGE 4X4 12PLY STRL (GAUZE/BANDAGES/DRESSINGS) ×5 IMPLANT
GLOVE BIO SURGEON STRL SZ7.5 (GLOVE) IMPLANT
GLOVE BIO SURGEON STRL SZ8 (GLOVE) IMPLANT
GLOVE EUDERMIC 7 POWDERFREE (GLOVE) IMPLANT
GLOVE ORTHO TXT STRL SZ7.5 (GLOVE) IMPLANT
GOWN STRL REUS W/ TWL LRG LVL3 (GOWN DISPOSABLE) IMPLANT
GOWN STRL REUS W/ TWL XL LVL3 (GOWN DISPOSABLE) ×4 IMPLANT
GOWN STRL REUS W/TWL LRG LVL3 (GOWN DISPOSABLE)
GOWN STRL REUS W/TWL XL LVL3 (GOWN DISPOSABLE) ×1
GUIDEWIRE CNFDA BRKR CVD (WIRE) ×5 IMPLANT
GUIDEWIRE SAF TJ AMPL .035X180 (WIRE) ×5 IMPLANT
GUIDEWIRE SAFE TJ AMPLATZ EXST (WIRE) ×5 IMPLANT
INSERT FOGARTY SM (MISCELLANEOUS) IMPLANT
KIT BASIN OR (CUSTOM PROCEDURE TRAY) ×5 IMPLANT
KIT DILATOR VASC 18G NDL (KITS) ×5 IMPLANT
KIT HEART LEFT (KITS) ×5 IMPLANT
KIT SUCTION CATH 14FR (SUCTIONS) IMPLANT
KIT TURNOVER KIT B (KITS) ×5 IMPLANT
LOOP VESSEL MAXI BLUE (MISCELLANEOUS) IMPLANT
LOOP VESSEL MINI RED (MISCELLANEOUS) IMPLANT
NS IRRIG 1000ML POUR BTL (IV SOLUTION) ×5 IMPLANT
PACK ENDO MINOR (CUSTOM PROCEDURE TRAY) ×5 IMPLANT
PAD ARMBOARD 7.5X6 YLW CONV (MISCELLANEOUS) ×10 IMPLANT
PAD ELECT DEFIB RADIOL ZOLL (MISCELLANEOUS) ×5 IMPLANT
PENCIL BUTTON HOLSTER BLD 10FT (ELECTRODE) IMPLANT
PERCLOSE PROGLIDE 6F (VASCULAR PRODUCTS) ×15
POSITIONER HEAD DONUT 9IN (MISCELLANEOUS) ×5 IMPLANT
SET MICROPUNCTURE 5F STIFF (MISCELLANEOUS) ×5 IMPLANT
SHEATH BRITE TIP 7FR 35CM (SHEATH) ×5 IMPLANT
SHEATH PINNACLE 6F 10CM (SHEATH) ×5 IMPLANT
SHEATH PINNACLE 8F 10CM (SHEATH) ×5 IMPLANT
SLEEVE REPOSITIONING LENGTH 30 (MISCELLANEOUS) ×5 IMPLANT
STOPCOCK MORSE 400PSI 3WAY (MISCELLANEOUS) ×10 IMPLANT
SUT ETHIBOND X763 2 0 SH 1 (SUTURE) IMPLANT
SUT GORETEX CV 4 TH 22 36 (SUTURE) IMPLANT
SUT GORETEX CV4 TH-18 (SUTURE) IMPLANT
SUT MNCRL AB 3-0 PS2 18 (SUTURE) IMPLANT
SUT PROLENE 5 0 C 1 36 (SUTURE) IMPLANT
SUT PROLENE 6 0 C 1 30 (SUTURE) IMPLANT
SUT SILK  1 MH (SUTURE) ×2
SUT SILK 1 MH (SUTURE) ×8 IMPLANT
SUT VIC AB 2-0 CT1 27 (SUTURE)
SUT VIC AB 2-0 CT1 TAPERPNT 27 (SUTURE) IMPLANT
SUT VIC AB 2-0 CTX 36 (SUTURE) IMPLANT
SUT VIC AB 3-0 SH 8-18 (SUTURE) IMPLANT
SYR 50ML LL SCALE MARK (SYRINGE) ×5 IMPLANT
SYR BULB IRRIG 60ML STRL (SYRINGE) IMPLANT
SYR MEDRAD MARK V 150ML (SYRINGE) ×5 IMPLANT
TOWEL GREEN STERILE (TOWEL DISPOSABLE) ×10 IMPLANT
TRANSDUCER W/STOPCOCK (MISCELLANEOUS) ×10 IMPLANT
TRAY FOLEY SLVR 14FR TEMP STAT (SET/KITS/TRAYS/PACK) IMPLANT
TUBE SUCT INTRACARD DLP 20F (MISCELLANEOUS) IMPLANT
VALVE AORTIC EVOLUT PROPLUS 29 (Valve) ×5 IMPLANT
WIRE EMERALD 3MM-J .035X150CM (WIRE) ×5 IMPLANT
WIRE EMERALD 3MM-J .035X260CM (WIRE) ×5 IMPLANT

## 2021-02-12 NOTE — Op Note (Signed)
HEART AND VASCULAR CENTER   MULTIDISCIPLINARY HEART VALVE TEAM   TAVR OPERATIVE NOTE   Date of Procedure:  09/04/2020  Preoperative Diagnosis: Severe Aortic Stenosis   Postoperative Diagnosis: Same   Procedure:    Transcatheter Aortic Valve Replacement - Percutaneous Transfemoral Approach  Medtronic Evolut Pro-Plus (size 29 mm, serial # [id number])   Co-Surgeons:  Valentina Gu. Roxy Manns, MD and Sherren Mocha, MD  Anesthesiologist:  Albertha Ghee, MD  Echocardiographer:  Sanda Klein, MD  Pre-operative Echo Findings:  Severe aortic stenosis  Normal left ventricular systolic function  Post-operative Echo Findings:  Trace paravalvular leak  Normal/unchanged left ventricular systolic function  BRIEF CLINICAL NOTE AND INDICATIONS FOR SURGERY  Please see the complete operative note of Dr Roxy Manns.   During the course of the patient's preoperative work up they have been evaluated comprehensively by a multidisciplinary team of specialists coordinated through the [facility] in the [place] [facility].  They have been demonstrated to suffer from symptomatic severe aortic stenosis as noted above. The patient has been counseled extensively as to the relative risks and benefits of all options for the treatment of severe aortic stenosis including long term medical therapy, conventional surgery for aortic valve replacement, and transcatheter aortic valve replacement.  The patient has been independently evaluated in formal cardiac surgical consultation by Dr Roxy Manns, who deemed the patient appropriate for TAVR. Based upon review of all of the patient's preoperative diagnostic tests they are felt to be candidate for transcatheter aortic valve replacement using the transfemoral approach as an alternative to conventional surgery.    Following the decision to proceed with transcatheter aortic valve replacement, a discussion has been held regarding what types of  management strategies would be attempted intraoperatively in the event of life-threatening complications, including whether or not the patient would be considered a candidate for the use of cardiopulmonary bypass and/or conversion to open sternotomy for attempted surgical intervention.  The patient has been advised of a variety of complications that might develop peculiar to this approach including but not limited to risks of death, stroke, paravalvular leak, aortic dissection or other major vascular complications, aortic annulus rupture, device embolization, cardiac rupture or perforation, acute myocardial infarction, arrhythmia, heart block or bradycardia requiring permanent pacemaker placement, congestive heart failure, respiratory failure, renal failure, pneumonia, infection, other late complications related to structural valve deterioration or migration, or other complications that might ultimately cause a temporary or permanent loss of functional independence or other long term morbidity.  The patient provides full informed consent for the procedure as described and all questions were answered preoperatively.  DETAILS OF THE OPERATIVE PROCEDURE  PREPARATION:   The patient is brought to the operating room on the above mentioned date and central monitoring was established by the anesthesia team including placement of a radial arterial line. The patient is placed in the supine position on the operating table.  Intravenous antibiotics are administered. The patient is monitored closely throughout the procedure under conscious sedation.  Baseline transthoracic echocardiogram is performed. The patient's chest, abdomen, both groins, and both lower extremities are prepared and draped in a sterile manner. A time out procedure is performed.   PERIPHERAL ACCESS:   Using ultrasound guidance, femoral arterial and venous access is obtained with placement of 6 Fr sheaths on the left side.  [place] images are captured  and digitally stored in the patient's record. A pigtail diagnostic catheter was passed through the femoral arterial sheath under fluoroscopic guidance into the aortic  root (non-coronary cusp).  A temporary transvenous pacemaker catheter was passed through the femoral venous sheath under fluoroscopic guidance into the right ventricle.  The pacemaker was tested to ensure stable lead placement and pacemaker capture.   TRANSFEMORAL ACCESS:  A micropuncture technique is used to access the right femoral artery under fluoroscopic and ultrasound guidance.  [place] images are captured and digitally stored in the patient's chart. 2 Perclose devices are deployed at 10' and 2' positions to 'PreClose' the femoral artery.  The first Perclose device failed and an additional device had to be placed.  An 8 French sheath is placed and then an Amplatz Superstiff wire is advanced through the sheath. This is changed out for an 18 Fr Dry-Seal sheath after progressively dilating over the Superstiff wire.  An AL-1 catheter was used to direct a straight-tip exchange length wire across the native aortic valve into the left ventricle. This was exchanged out for a pigtail catheter and position was confirmed in the LV apex.  Simultaneous LV and Ao pressures were recorded.  The pigtail catheter was exchanged for a Confida wire in the LV apex.    BALLOON AORTIC VALVULOPLASTY:  This is performed with an 18 mm true balloon using rapid pacing.  The patient's hemodynamics recovered quickly with no complication.  TRANSCATHETER HEART VALVE DEPLOYMENT:  A Medtronic Evolut Pro-Plus transcatheter heart valve (size 29 mm) was prepared and crimped per manufacturer's guidelines, and the proper orientation of the valve is confirmed on the Medtronic delivery system. The valve was advanced through the introducer sheath and across the aortic arch over the Confida wire until it is positioned at the base of the pigtail catheter in the aortic valve annulus.  Using the fine tuning wheel, valve deployment begins until annular contact is made. Controlled ventricular pacing is performed. Once proper position is confirmed via aortic root angiograms in the cusp overlap and LAO projections, the valve is deployed to 80% where it is fully functional. The patient's hemodynamic recovery following valve deployment is good.  Final position is confirmed and the valve is slowly deployed over 30 seconds until both paddles are released. The delivery catheter and Confida wire are removed and the valve is assessed with echocardiography. Echo demostrated acceptable post-procedural gradients, stable mitral valve function, and trace aortic insufficiency.    PROCEDURE COMPLETION:  The sheath was removed and femoral artery closure is performed using the 2 previously deployed Perclose devices.  Protamine is administered once femoral arterial repair was complete. The site is clear with no evidence of bleeding or hematoma after the sutures are tightened. The  pigtail catheters are removed. Mynx closure is used for contralateral femoral arterial hemostasis for the 6 Fr sheath.  The patient required temporary pacing throughout the procedure because of complete AV block.  The temporary pacemaker is left in place at the completion of the procedure.  The patient tolerated the procedure well and is transported to the recovery area in stable condition. There were no immediate intraoperative complications. All sponge instrument and needle counts are verified correct at completion of the operation.   Sherren Mocha, MD 02/12/2021 6:30 PM

## 2021-02-12 NOTE — OR Nursing (Signed)
Temporary pacemaker in left groin. Rate 70, mA 10.0, 62cm

## 2021-02-12 NOTE — Op Note (Signed)
HEART AND VASCULAR CENTER   MULTIDISCIPLINARY HEART VALVE TEAM   TAVR OPERATIVE NOTE   Date of Procedure:  02/12/2021  Preoperative Diagnosis: Severe Aortic Stenosis   Postoperative Diagnosis: Same   Procedure:    Transcatheter Aortic Valve Replacement - Percutaneous Right Transfemoral Approach  Medtronic CoreValve Evolut ProPlus (size 29 mm, serial # [id number])   Co-Surgeons:  Valentina Gu. Roxy Manns, MD and Sherren Mocha, MD  Anesthesiologist:  Albertha Ghee, MD  Echocardiographer:  Sanda Klein, MD  Pre-operative Echo Findings:  Severe aortic stenosis  Normal left ventricular systolic function  Post-operative Echo Findings:  Trivial paravalvular leak  Normal left ventricular systolic function   BRIEF CLINICAL NOTE AND INDICATIONS FOR SURGERY  Patient is 70 year old morbidly obese female with history of aortic stenosis, hypertension, hyperlipidemia, and mixed connective tissue disease who has been referred for surgical consultation to discuss treatment options for management of severe symptomatic aortic stenosis.  The patient states that she has noted the presence of a heart murmur for many years.  She was followed for several years by a cardiologist in [place] with known diagnosis of aortic stenosis that had gradually progressed in severity.  She also has a long history of dyspnea on exertion.  Symptoms have progressed fairly dramatically over the last few months to the point where recently the patient gets short of breath with very low level activity and occasionally at rest.  She recently moved from [place] to [facility] and establish care with Dr. Farris Has.  Routine follow-up transthoracic echocardiogram performed January 15, 2021 confirmed the presence of severe aortic stenosis.  Peak velocity across aortic valve measured at size 5.3 m/s corresponding to mean transvalvular gradient estimated 69 mmHg and aortic valve area calculated 0.81 cm by VTI.  The  DVI was reported 0.26 with stroke-volume index 49.  Left ventricular systolic function was hyperdynamic with ejection fraction estimated 65 to 70%.  The patient was referred to the multidisciplinary heart valve clinic and has been evaluated previously by Dr. Burt Knack.  Diagnostic cardiac catheterization was performed January 29, 2021 and confirmed presence of severe aortic stenosis.  There was normal coronary artery anatomy with no flow-limiting coronary artery disease.  Right heart pressures were mildly elevated.  CT angiography was performed and the patient referred for surgical consultation.  During the course of the patient's preoperative work up they have been evaluated comprehensively by a multidisciplinary team of specialists coordinated through the [facility] in the [place] [facility].  They have been demonstrated to suffer from symptomatic severe aortic stenosis as noted above. The patient has been counseled extensively as to the relative risks and benefits of all options for the treatment of severe aortic stenosis including long term medical therapy, conventional surgery for aortic valve replacement, and transcatheter aortic valve replacement.  All questions have been answered, and the patient provides full informed consent for the operation as described.   DETAILS OF THE OPERATIVE PROCEDURE  PREPARATION:    The patient is brought to the operating room on the above mentioned date and central monitoring was established by the anesthesia team including placement of a central venous line and radial arterial line. The patient is placed in the supine position on the operating table.  Intravenous antibiotics are administered. The patient is monitored closely throughout the procedure under conscious sedation.  Baseline transthoracic echocardiogram was performed. The patient's chest, abdomen, both groins, and both lower extremities are prepared and draped in a  sterile manner. A  time out procedure is performed.   PERIPHERAL ACCESS:    Using the modified Seldinger technique, femoral arterial and venous access was obtained with placement of 6 Fr sheaths on the left side.  A pigtail diagnostic catheter was passed through the left arterial sheath under fluoroscopic guidance into the aortic root.  The pigtail catheter is positioned in the non-coronary sinus of Valsalva.  A temporary transvenous pacemaker catheter was passed through the left femoral venous sheath under fluoroscopic guidance into the right ventricle.  The pacemaker was tested to ensure stable lead placement and pacemaker capture. Aortic root angiography was performed in order to determine the optimal angiographic angle for valve deployment.   TRANSFEMORAL ACCESS:   Percutaneous transfemoral access and sheath placement was performed by Dr. Burt Knack using ultrasound guidance.  The right common femoral artery was cannulated using a micropuncture needle and appropriate location was verified using hand injection angiogram.  A pair of Abbott Perclose percutaneous closure devices were placed and a 6 French sheath replaced into the femoral artery.  The patient was heparinized systemically and ACT verified > 250 seconds.    A 18 Fr transfemoral Gore DrySeal sheath was introduced into the right common femoral artery after progressively dilating over an Amplatz superstiff wire. An AL-2 catheter was used to direct a straight-tip exchange length wire across the native aortic valve into the left ventricle. This was exchanged out for a pigtail catheter and position was confirmed in the LV apex. The pigtail catheter was exchanged for Confida wire in the LV apex.  Echocardiography was utilized to confirm appropriate wire position and no sign of entanglement in the mitral subvalvular apparatus.   BALLOON AORTIC VALVULOPLASTY:   Balloon aortic valvuloplasty was performed using a 18 mm valvuloplasty balloon.  Once  optimal position was achieved, BAV was done under rapid ventricular pacing. The patient recovered well hemodynamically.    TRANSCATHETER HEART VALVE DEPLOYMENT:   A Medtronic CoreValve Evolut ProPlus transcatheter heart valve (size29 mm, serial #[id number]) was prepared and crimped per manufacturer's guidelines, and the proper loading of the valve is confirmed on the [facility] Pro delivery system using flouroscopy. The valve and delivery system were advanced over the guidewire, through the iliac arteries and aorta, and advanced across the aortic arch using flouroscopy. The valve was carefully positioned across the aortic valve annulus. Once appropriate position of the valve has been confirmed by angiographic assessment, the valve is deployed gradually to 80%, at which time a second aortogram was performed to confirm the appropriate depth and position of deployment.  Once final position was confirmed, deployment was completed, the valve released, and the delivery system carefully removed from the aortic root. Valve function is assessed using echocardiography. There is felt to be trivial paravalvular leak and no central aortic insufficiency.  The patient's hemodynamic recovery following valve deployment is rapid and uneventful, although the patient remained in complete heart block.   PROCEDURE COMPLETION:   The deployment system is and guidewire were removed and femoral artery closure performed by securing the Perclose sutures.  Protamine was administered once femoral arterial repair was complete. The temporary pacemaker was secured in place.  The pigtail catheters and femoral sheaths were removed with manual pressure used for hemostasis.   The patient tolerated the procedure well and is transported to the surgical intensive care in stable condition. There were no immediate intraoperative complications. All sponge instrument and needle counts are verified correct at completion of the operation.   No blood  products were administered during  the operation.    Rexene Alberts, MD 02/12/2021 10:05 AM

## 2021-02-12 NOTE — Plan of Care (Signed)

## 2021-02-12 NOTE — Anesthesia Procedure Notes (Signed)
Procedure Name: MAC Date/Time: 02/12/2021 7:35 AM Performed by: Leonor Liv, CRNA Pre-anesthesia Checklist: Patient identified, Emergency Drugs available, Suction available, Patient being monitored and Timeout performed Patient Re-evaluated:Patient Re-evaluated prior to induction Oxygen Delivery Method: Simple face mask Placement Confirmation: positive ETCO2 Dental Injury: Teeth and Oropharynx as per pre-operative assessment

## 2021-02-12 NOTE — Interval H&P Note (Signed)
History and Physical Interval Note:  02/12/2021 5:44 AM  Katherine Waters  has presented today for surgery, with the diagnosis of Severe Aortic Stenosis.  The various methods of treatment have been discussed with the patient and family. After consideration of risks, benefits and other options for treatment, the patient has consented to  Procedure(s): TRANSCATHETER AORTIC VALVE REPLACEMENT, TRANSFEMORAL (N/A) TRANSESOPHAGEAL ECHOCARDIOGRAM (TEE) (N/A) as a surgical intervention.  The patient's history has been reviewed, patient examined, no change in status, stable for surgery.  I have reviewed the patient's chart and labs.  Questions were answered to the patient's satisfaction.     Sherren Mocha

## 2021-02-12 NOTE — Progress Notes (Signed)
  [place] VALVE TEAM  Patient doing well s/p TAVR. She is hemodynamically stable but requiring small dose of pressors. Groin sites stable, temp wire still in place on right. ECG with intrinsic rhythm with ILBBB. There is no high grade block. Pacer turned down to 40 bpm. Will watch rhythm and pull temp wire if HR remains stable.   Angelena Form PA-C  [facility]  Pager [phone]

## 2021-02-12 NOTE — Anesthesia Procedure Notes (Signed)
Arterial Line Insertion Start/End4/26/2022 6:50 AM, 02/12/2021 7:05 AM Performed by: Wilburn Cornelia, CRNA, CRNA  Patient location: Pre-op. Preanesthetic checklist: patient identified, IV checked, site marked, risks and benefits discussed, surgical consent, monitors and equipment checked, pre-op evaluation, timeout performed and anesthesia consent Lidocaine 1% used for infiltration Left, radial was placed Catheter size: 20 G Hand hygiene performed  and maximum sterile barriers used  Allen's test indicative of satisfactory collateral circulation Attempts: 1 Procedure performed without using ultrasound guided technique. Following insertion, dressing applied and Biopatch. Post procedure assessment: normal  Patient tolerated the procedure well with no immediate complications.

## 2021-02-12 NOTE — Transfer of Care (Signed)
Immediate Anesthesia Transfer of Care Note  Patient: Katherine Waters  Procedure(s) Performed: TRANSCATHETER AORTIC VALVE REPLACEMENT, TRANSFEMORAL (N/A Groin) INTRAOPERATIVE TRANSTHORACIC ECHOCARDIOGRAM (Left Chest) ULTRASOUND GUIDANCE FOR VASCULAR ACCESS (Bilateral Groin)  Patient Location: SICU  Anesthesia Type:MAC  Level of Consciousness: awake, alert  and oriented  Airway & Oxygen Therapy: Patient Spontanous Breathing and Patient connected to face mask oxygen  Post-op Assessment: Report given to RN, Post -op Vital signs reviewed and stable and Patient moving all extremities  Post vital signs: Reviewed and stable  Last Vitals:  Vitals Value Taken Time  BP    Temp    Pulse 72 02/12/21 1028  Resp 33 02/12/21 1028  SpO2 100 % 02/12/21 1028  Vitals shown include unvalidated device data.  Last Pain:  Vitals:   02/12/21 0605  PainSc: 0-No pain      Patients Stated Pain Goal: 3 (58/85/02 7741)  Complications: No complications documented.

## 2021-02-12 NOTE — Interval H&P Note (Signed)
History and Physical Interval Note:  02/12/2021 5:54 AM  Katherine Waters  has presented today for surgery, with the diagnosis of Severe Aortic Stenosis.  The various methods of treatment have been discussed with the patient and family. After consideration of risks, benefits and other options for treatment, the patient has consented to  Procedure(s): TRANSCATHETER AORTIC VALVE REPLACEMENT, TRANSFEMORAL (N/A) TRANSESOPHAGEAL ECHOCARDIOGRAM (TEE) (N/A) as a surgical intervention.  The patient's history has been reviewed, patient examined, no change in status, stable for surgery.  I have reviewed the patient's chart and labs.  Questions were answered to the patient's satisfaction.     Rexene Alberts

## 2021-02-12 NOTE — Progress Notes (Signed)
  Echocardiogram 2D Echocardiogram has been performed.  Katherine Waters 02/12/2021, 9:23 AM

## 2021-02-12 NOTE — Progress Notes (Signed)
Patient ID: Katherine Waters, female   DOB: 05/09/1951, 70 y.o.   MRN: [id number]  TCTS Evening Rounds:   Hemodynamically stable  Rhythm is sinus 76. Pacer on backup  Awake and alert Groin sites ok.  A/P:  Stable postop course. Continue current plans

## 2021-02-13 ENCOUNTER — Encounter (HOSPITAL_COMMUNITY): Payer: Self-pay | Admitting: Cardiovascular Disease

## 2021-02-13 ENCOUNTER — Inpatient Hospital Stay (HOSPITAL_COMMUNITY): Payer: Medicare Other

## 2021-02-13 ENCOUNTER — Inpatient Hospital Stay (INDEPENDENT_AMBULATORY_CARE_PROVIDER_SITE_OTHER): Payer: Medicare Other

## 2021-02-13 DIAGNOSIS — I350 Nonrheumatic aortic (valve) stenosis: Principal | ICD-10-CM

## 2021-02-13 DIAGNOSIS — Z952 Presence of prosthetic heart valve: Secondary | ICD-10-CM | POA: Diagnosis not present

## 2021-02-13 DIAGNOSIS — I442 Atrioventricular block, complete: Secondary | ICD-10-CM | POA: Diagnosis not present

## 2021-02-13 LAB — ECHOCARDIOGRAM COMPLETE
AR max vel: 2.04 cm2
AV Area VTI: 2.4 cm2
AV Area mean vel: 2.32 cm2
AV Mean grad: 9.3 mmHg
AV Peak grad: 17.9 mmHg
Ao pk vel: 2.11 m/s
Area-P 1/2: 1.98 cm2
Height: 62 in
P 1/2 time: 137.6 msec
P 1/2 time: 558 msec
S' Lateral: 2.5 cm
Weight: 3640.24 oz

## 2021-02-13 LAB — MAGNESIUM: Magnesium: 1.9 mg/dL (ref 1.7–2.4)

## 2021-02-13 LAB — BASIC METABOLIC PANEL
Anion gap: 9 (ref 5–15)
BUN: 12 mg/dL (ref 8–23)
CO2: 26 mmol/L (ref 22–32)
Calcium: 8.8 mg/dL — ABNORMAL LOW (ref 8.9–10.3)
Chloride: 102 mmol/L (ref 98–111)
Creatinine, Ser: 0.67 mg/dL (ref 0.44–1.00)
GFR, Estimated: >60 mL/min (ref >60)
Glucose, Bld: 112 mg/dL — ABNORMAL HIGH (ref 70–99)
Potassium: 3.8 mmol/L (ref 3.5–5.1)
Sodium: 137 mmol/L (ref 135–145)

## 2021-02-13 LAB — CBC
HCT: 36.2 % (ref 36.0–46.0)
Hemoglobin: 12 g/dL (ref 12.0–15.0)
MCH: 31.6 pg (ref 26.0–34.0)
MCHC: 33.1 g/dL (ref 30.0–36.0)
MCV: 95.3 fL (ref 80.0–100.0)
Platelets: 201 10*3/uL (ref 150–400)
RBC: 3.8 MIL/uL — ABNORMAL LOW (ref 3.87–5.11)
RDW: 12.9 % (ref 11.5–15.5)
WBC: 9 10*3/uL (ref 4.0–10.5)
nRBC: 0 % (ref 0.0–0.2)

## 2021-02-13 LAB — ECHOCARDIOGRAM LIMITED
AR max vel: 0.8 cm2
AV Area VTI: 0.8 cm2
AV Area mean vel: 0.82 cm2
AV Mean grad: 50.2 mmHg
AV Peak grad: 86.3 mmHg
Ao pk vel: 4.64 m/s
P 1/2 time: 84.4 msec

## 2021-02-13 MED ORDER — ASPIRIN 81 MG PO CHEW: 81.0000 mg | CHEWABLE_TABLET | Freq: Every day | ORAL | Status: AC

## 2021-02-13 MED ORDER — IRBESARTAN 300 MG PO TABS
300.0000 mg | ORAL_TABLET | Freq: Every day | ORAL | Status: DC
  Administered 2021-02-13: 300 mg via ORAL
  Filled 2021-02-13: qty 1

## 2021-02-13 MED ORDER — CLOPIDOGREL BISULFATE 75 MG PO TABS: 75.0000 mg | ORAL_TABLET | Freq: Every day | ORAL | 1 refills | Status: DC

## 2021-02-13 MED ORDER — PANTOPRAZOLE SODIUM 40 MG PO TBEC: 40.0000 mg | DELAYED_RELEASE_TABLET | Freq: Every day | ORAL | 1 refills | Status: DC

## 2021-02-13 MED ORDER — INDAPAMIDE 2.5 MG PO TABS
2.5000 mg | ORAL_TABLET | Freq: Every day | ORAL | Status: DC
  Administered 2021-02-13: 2.5 mg via ORAL
  Filled 2021-02-13: qty 1

## 2021-02-13 MED ORDER — AMLODIPINE BESYLATE 10 MG PO TABS
10.0000 mg | ORAL_TABLET | Freq: Every day | ORAL | Status: DC
  Administered 2021-02-13: 10 mg via ORAL
  Filled 2021-02-13: qty 1

## 2021-02-13 NOTE — Progress Notes (Signed)
AVS reviewed with patient, all questions answered.  Escorted to lobby via wheelchair and into vehicle with husband.

## 2021-02-13 NOTE — Discharge Summary (Addendum)
[facility] VALVE TEAM  Discharge Summary    Patient ID: Katherine Waters MRN: [id number]; DOB: 05-Oct-1951  Admit date: 02/12/2021 Discharge date: 02/13/2021  Primary Care Provider: Maude Leriche, PA-C  Primary Cardiologist: Dr. Audie Box / Dr. Burt Knack & Dr. Roxy Manns (TAVR)  Discharge Diagnoses    Principal Problem:   S/P TAVR (transcatheter aortic valve replacement) Active Problems:   Severe aortic stenosis   Hypertension   Connective tissue disease ([place])   Hyperlipidemia   Asthma   Raynaud disease   GERD (gastroesophageal reflux disease)   Fibromyalgia   Depression   Transient complete heart block (HCC)   Allergies Allergies  Allergen Reactions  . Dobutamine     Decreases heart rate   . Latex Rash  . Sulfa Antibiotics Rash    Diagnostic Studies/Procedures    TAVR OPERATIVE NOTE   Date of Procedure:09/04/2020  Preoperative Diagnosis:Severe Aortic Stenosis   Postoperative Diagnosis:Same   Procedure:   Transcatheter Aortic Valve Replacement - Percutaneous Transfemoral Approach Medtronic Evolut Pro-Plus (size 41mm, serial # [id number])  Co-Surgeons:Clarence H. Roxy Manns, MD and Sherren Mocha, MD  Anesthesiologist:Adam Marcie Bal, MD  Echocardiographer:Mihai Croitoru, MD  Pre-operative Echo Findings: ? Severe aortic stenosis ? Normalleft ventricular systolic function  Post-operative Echo Findings: ? Traceparavalvular leak ? Normal/unchangedleft ventricular systolic function  ____________________  Echo 02/13/21: echo pending formal read   History of Present Illness     Katherine Waters is a 70 y.o. 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 on 02/12/21 for planned TAVR.  The patient states that she has  noted the presence of a heart murmur for many years.  She was followed for several years by a cardiologist in [place] with known diagnosis of aortic stenosis that had gradually progressed in severity.  She also has a long history of dyspnea on exertion.  Symptoms have progressed fairly dramatically over the last few months to the point where recently the patient gets short of breath with very low level activity and occasionally at rest.  She recently moved from [place] to [facility] and establish care with Dr. Farris Has.  Routine follow-up transthoracic echocardiogram performed January 15, 2021 confirmed the presence of severe aortic stenosis.  Peak velocity across aortic valve measured at size 5.3 m/s corresponding to mean transvalvular gradient estimated 69 mmHg and aortic valve area calculated 0.81 cm by VTI.  The DVI was reported 0.26 with stroke-volume index 49.  Left ventricular systolic function was hyperdynamic with ejection fraction estimated 65 to 70%.  The patient was referred to the multidisciplinary heart valve clinic and has been evaluated previously by Dr. Burt Knack.  Diagnostic cardiac catheterization was performed January 29, 2021 and confirmed presence of severe aortic stenosis.  There was normal coronary artery anatomy with no flow-limiting coronary artery disease.  Right heart pressures were mildly elevated.  The patient has been evaluated by the multidisciplinary valve team and felt to have severe, symptomatic aortic stenosis and to be a suitable candidate for TAVR, which was set up for 02/12/21.   Hospital Course     Consultants: none  Severe AS:s/p successful TAVR with a 29 mm Evolut Pro + THV via the TF approach on 02/12/21. Post operative echo pending. Groin sites are stable. ECG with sinus and no high grade heart block. Started on aspirin and plavix. Plan for discharge home today Zio AT.   Transient CHB: pt developed  CHB during the procedure. Her temp wire was  left in place. She has not required the pacemaker since yesterday morning. Temp wire pulled. ECG shows narrow QRS and normal PR. Will plan to discharge home today with Zio AT.  HTN: BP elevated. Resumed on home meds.  GERD: Prilosec was changed to Protonix given potential drug drug interaction with Plavix  _____________  Discharge Vitals Blood pressure 139/85, pulse 79, temperature 98.4 F (36.9 C), temperature source Oral, resp. rate 14, height 5\' 2"  (1.575 m), weight 103.2 kg, SpO2 93 %.  Filed Weights   02/12/21 0555 02/13/21 0300  Weight: 102.3 kg 103.2 kg    GEN: Well nourished, well developed, in no acute distress. obese HEENT: Grossly normal.  Neck: Supple, no JVD or masses. Cardiac: RRR, very soft flow murmurs @ RUSB. No rubs, or gallops. No clubbing, cyanosis, edema.   Respiratory:  Respirations regular and unlabored, clear to auscultation bilaterally. GI: Soft, nontender, nondistended, BS + x 4. MS: no deformity or atrophy. Skin: warm and dry, no rash.  Groin sites clear without hematoma or ecchymosis  Neuro:  Strength and sensation are intact. Psych: AAOx3.  Normal affect  Labs & Radiologic Studies    CBC Recent Labs    02/12/21 1107 02/13/21 0148  WBC  --  9.0  HGB 11.6* 12.0  HCT 34.0* 36.2  MCV  --  95.3  PLT  --  123456   Basic Metabolic Panel Recent Labs    02/12/21 1107 02/13/21 0148  NA 143 137  K 3.4* 3.8  CL 103 102  CO2  --  26  GLUCOSE 106* 112*  BUN 21 12  CREATININE 0.60 0.67  CALCIUM  --  8.8*  MG  --  1.9   Liver Function Tests No results for input(s): AST, ALT, ALKPHOS, BILITOT, PROT, ALBUMIN in the last 72 hours. No results for input(s): LIPASE, AMYLASE in the last 72 hours. Cardiac Enzymes No results for input(s): CKTOTAL, CKMB, CKMBINDEX, TROPONINI in the last 72 hours. BNP Invalid input(s): POCBNP D-Dimer No results for input(s): DDIMER in the last 72 hours. Hemoglobin A1C No results for input(s): HGBA1C in the last 72  hours. Fasting Lipid Panel No results for input(s): CHOL, HDL, LDLCALC, TRIG, CHOLHDL, LDLDIRECT in the last 72 hours. Thyroid Function Tests No results for input(s): TSH, T4TOTAL, T3FREE, THYROIDAB in the last 72 hours.  Invalid input(s): FREET3 _____________  DG Chest 2 View  Result Date: 02/09/2021 CLINICAL DATA:  Pre-admit for TAVR EXAM: CHEST - 2 VIEW COMPARISON:  CT scan February 01, 2021 FINDINGS: Mild scar or atelectasis in the bases. The heart, hila, mediastinum, lungs, and pleura are otherwise unremarkable. A density projects over the inferior posterior chest on the lateral view was noted to represent a fat pad based on previous CT imaging. IMPRESSION: No significant abnormalities. Electronically Signed   By: Dorise Bullion III M.D   On: 02/09/2021 18:16   CARDIAC CATHETERIZATION  Result Date: 01/29/2021  Fluoroscopy demonstrated severe aortic valve calcification with minimal movement and heavily calcified mitral annulus.  Right dominant coronary anatomy with widely patent normal coronary arteries.  Severe calcific aortic stenosis.  (Greater than 70 mmHg peak gradient, but due to catheter instability/"ejection by LV", typical recordings could not be made).   LVEDP 19 mmHg.  Moderate pulmonary with mean pressure 35 mmHg.  Pulmonary capillary wedge mean 22 mmHg.  Cardiac output 7.8 by Fick 7.4 RECOMMENDATIONS:  Referred to Dr. Cyndia Bent for surgical consultation as part of the  multidisciplinary valve evaluation.  CT CORONARY MORPH W/CTA COR W/SCORE W/CA W/CM &/OR WO/CM  Addendum Date: 02/01/2021   ADDENDUM REPORT: 02/01/2021 13:20 CLINICAL DATA:  Severe Aortic Stenosis. EXAM: Cardiac TAVR CT TECHNIQUE: The patient was scanned on a [employer]. A 110 kV retrospective scan was triggered in the descending thoracic aorta at 111 HU's. Gantry rotation speed was 250 msecs and collimation was .6 mm. No beta blockade or nitro were given. The 3D data set was reconstructed in 5% intervals  of the R-R cycle. Systolic and diastolic phases were analyzed on a dedicated work station using MPR, MIP and VRT modes. The patient received 80 cc of contrast. FINDINGS: Image quality: Excellent. Noise artifact is: Limited. Valve Morphology: The aortic valve is tricuspid. The leaflets and severely calcified with diffuse bulky calcifications. The leaflets demonstrate severely restricted motion in systole. Aortic Valve Calcium score: 2253 Aortic annular dimension: Phase assessed: 20% Annular area: 401 mm2 Annular perimeter: 72.9 mm Max diameter: 27.4 mm Min diameter: 19.3 mm Annular and subannular calcification: None. Optimal coplanar projection: LAO 26 CAU 1 Coronary Artery Height above Annulus: Left Main: 11.5 mm Right Coronary: 14.4 mm Sinus of Valsalva Measurements: Non-coronary: 29 mm Right-coronary: 29 mm Left-coronary: 31 mm Sinus of Valsalva Height: Non-coronary: 21.0 mm Right-coronary: 19.3 mm Left-coronary: 17.7 mm Sinotubular Junction: Ascending Thoracic Aorta: Coronary Arteries: Normal coronary origin. Right dominance. The study was performed without use of NTG and is insufficient for plaque evaluation. Please refer to recent cardiac catheterization for coronary assessment. The following assessment was made: Left main: Normal origin from the [facility]. Bifurcates to form the left anterior descending artery and left circumflex artery. There is no plaque or stenosis. LAD: There is minimal calcified plaque (<25%) in the mid segment. The LAD gives off 2 diagonal branches without plaque or stenosis. LCX: There is plaque or stenosis. RCA: Normal origin from the [place]. There is minimal mixed density plaque (<25%). The RCA terminates as a patent PDA and PLV branch. Cardiac Morphology: Right Atrium: Right atrial size is within normal limits. Right Ventricle: The right ventricular cavity is within normal limits. Left Atrium: Left atrial size is normal in size with no left atrial appendage filling defect. Left Ventricle: The  ventricular cavity size is within normal limits. There are no stigmata of prior infarction. There is no abnormal filling defect. Normal left ventricular function, LVEF 82%. No regional wall motion abnormalities. Pulmonary arteries: Normal in size without proximal filling defect. Pulmonary veins: Normal pulmonary venous drainage. Pericardium: Normal thickness with no significant effusion or calcium present. Mitral Valve: The mitral valve is normal structure with moderate annular calcification. Extra-cardiac findings: See attached radiology report for non-cardiac structures. IMPRESSION: 1. Tricuspid aortic valve with severe aortic stenosis. 2. Annular measurements appropriate for 23 mm Edwards Sapien 3 TAVR (401 mm2). 3. No significant annular or subannular calcifications. 4. Sufficient coronary to annulus distance. 5. Optimal Fluoroscopic Angle for Delivery: LAO 26 CAU 1 6. 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 7. Moderate mitral annular calcification. [place] T. Audie Box, MD Electronically Signed   By: Eleonore Chiquito   On: 02/01/2021 13:20   Result Date: 02/01/2021 EXAM: OVER-READ INTERPRETATION  CT CHEST The following report is an over-read performed by radiologist Dr. Vinnie Langton of [facility], [place] on 02/01/2021. This over-read does not include interpretation of cardiac or coronary anatomy or pathology. The coronary calcium score/coronary CTA interpretation by the cardiologist is attached. COMPARISON:  None. FINDINGS: Extracardiac findings will be described separately on dictation  for contemporaneously obtained CT of the chest, abdomen and pelvis. IMPRESSION: Please see separate dictation for contemporaneously obtained CTA chest, abdomen and pelvis dated 02/01/2021 for full description of relevant extracardiac findings. Electronically Signed: By: Vinnie Langton M.D. On: 02/01/2021 10:48   CT ANGIO CHEST AORTA W/CM & OR WO/CM  Result Date: 02/02/2021 CLINICAL DATA:  70 year old female with history  of severe aortic stenosis. Preprocedural study prior to potential transcatheter aortic valve replacement (TAVR) procedure. EXAM: CT ANGIOGRAPHY CHEST, ABDOMEN AND PELVIS TECHNIQUE: Multidetector CT imaging through the chest, abdomen and pelvis was performed using the standard protocol during bolus administration of intravenous contrast. Multiplanar reconstructed images and MIPs were obtained and reviewed to evaluate the vascular anatomy. CONTRAST:  123mL OMNIPAQUE IOHEXOL 350 MG/ML SOLN COMPARISON:  No priors. FINDINGS: CTA CHEST FINDINGS Cardiovascular: Heart size is mildly enlarged. There is no significant pericardial fluid, thickening or pericardial calcification. Aortic atherosclerosis. No definite coronary artery calcifications. Severe thickening and calcification of the aortic valve. Severe calcifications of the mitral annulus. Mediastinum/Lymph Nodes: No pathologically enlarged mediastinal or hilar lymph nodes. Esophagus is unremarkable in appearance. Lungs/Pleura: Right-sided Bochdalek's hernia. Scattered areas of mild linear scarring are noted in the lung bases bilaterally. No acute consolidative airspace disease. No pleural effusions. No suspicious appearing pulmonary nodules or masses are noted. Musculoskeletal/Soft Tissues: There are no aggressive appearing lytic or blastic lesions noted in the visualized portions of the skeleton. CTA ABDOMEN AND PELVIS FINDINGS Hepatobiliary: No suspicious cystic or solid hepatic lesions. No intra or extrahepatic biliary ductal dilatation. Gallbladder is normal in appearance. Pancreas: No pancreatic mass. No pancreatic ductal dilatation. No pancreatic or peripancreatic fluid collections or inflammatory changes. Spleen: Unremarkable. Adrenals/Urinary Tract: Horseshoe kidney (normal anatomical variant). No suspicious renal lesions. No hydroureteronephrosis. Bilateral adrenal glands are normal in appearance. Urinary bladder is nearly decompressed, but otherwise  unremarkable in appearance. Stomach/Bowel: The appearance of the stomach is normal. No pathologic dilatation of small bowel or colon. A few scattered colonic diverticulae are noted, without surrounding inflammatory changes to suggest an acute diverticulitis at this time. The appendix is not confidently identified and may be surgically absent. Regardless, there are no inflammatory changes noted adjacent to the cecum to suggest the presence of an acute appendicitis at this time. Vascular/Lymphatic: Aortic atherosclerosis, without evidence of aneurysm or dissection noted in the abdominal or pelvic vasculature. Vascular findings and measurements pertinent to potential TAVR procedure, as detailed the low. No lymphadenopathy noted in the abdomen or pelvis. Reproductive: Status post hysterectomy. Right ovary is not confidently identified may be surgically absent or atrophic. Well-defined low-attenuation left adnexal lesion measuring 3.2 x 2.5 x 3.2 cm, likely a cyst. Other: No significant volume of ascites.  No pneumoperitoneum. Musculoskeletal: Status post PLIF at L4-L5. There are no aggressive appearing lytic or blastic lesions noted in the visualized portions of the skeleton. VASCULAR MEASUREMENTS PERTINENT TO TAVR: AORTA: Minimal Aortic Diameter-12 x 14 mm Severity of Aortic Calcification-moderate RIGHT PELVIS: Right Common Iliac Artery - Minimal Diameter-9.7 x 9.3 mm Tortuosity-mild Calcification-mild Right External Iliac Artery - Minimal Diameter-8.8 x 8.7 mm Tortuosity-severe Calcification-none Right Common Femoral Artery - Minimal Diameter-9.3 x 8.3 mm Tortuosity-mild Calcification-mild LEFT PELVIS: Left Common Iliac Artery - Minimal Diameter-10.4 x 8.8 mm Tortuosity-mild Calcification-mild Left External Iliac Artery - Minimal Diameter-8.6 x 8.8 mm Tortuosity-severe Calcification-none Left Common Femoral Artery - Minimal Diameter-9.0 x 9.0 mm Tortuosity-mild Calcification-none Review of the MIP images confirms the  above findings. IMPRESSION: 1. Vascular findings and measurements pertinent to potential TAVR procedure, as  detailed above. 2. Severe thickening calcification of the aortic valve, compatible with reported clinical history of severe aortic stenosis. 3. Horseshoe kidney, normal anatomical variant, incidentally noted. 4. Colonic diverticulosis without evidence of acute diverticulitis at this time. 5. Additional incidental findings, as above. Electronically Signed   By: Vinnie Langton M.D.   On: 02/02/2021 07:09   ECHOCARDIOGRAM COMPLETE  Result Date: 01/15/2021    ECHOCARDIOGRAM REPORT   Patient Name:   NIKAELA Sawtelle   Date of Exam: 01/15/2021 Medical Rec #:  [id number]     Height:       62.8 in Accession #:    [id number]    Weight:       222.0 lb Date of Birth:  02/22/1951     BSA:          2.016 m Patient Age:    54 years      BP:           152/80 mmHg Patient Gender: F             HR:           80 bpm. Exam Location:  [address] Procedure: 2D Echo, 3D Echo, Cardiac Doppler, Color Doppler and Strain Analysis Indications:    I35.0 Aortic Stenosis  History:        Patient has no prior history of Echocardiogram examinations.                 Aortic Valve Disease, Signs/Symptoms:Shortness of Breath; Risk                 Factors:Family History of Coronary Artery Disease, Hypertension,                 Dyslipidemia and Former Smoker. Prior Studies done in [place].  Sonographer:    Deliah Boston RDCS Referring Phys: [id number] No Name  1. Very severe aortic valve stenosis. DI is 0.26, impacted by hyperdynamic state and high flow velocities. The aortic valve is abnormal. There is severe calcifcation of the aortic valve. Aortic valve regurgitation is trivial. Very severe aortic valve stenosis. Aortic valve area, by VTI measures 0.81 cm. Aortic valve mean gradient measures 69.0 mmHg from right sternal border. Aortic valve Vmax measures 5.30 m/s.  2. Left ventricular ejection fraction, by  estimation, is 65 to 70%. The left ventricle has normal function. The left ventricle has no regional wall motion abnormalities. There is moderate asymmetric left ventricular hypertrophy of the basal-septal and and mild LVH in other segments. Left ventricular diastolic parameters are consistent with Grade II diastolic dysfunction (pseudonormalization). Elevated left atrial pressure. The average left ventricular global longitudinal strain is 18.8 %. The global longitudinal strain is normal.  3. Right ventricular systolic function is normal. The right ventricular size is normal. There is normal pulmonary artery systolic pressure. The estimated right ventricular systolic pressure is 38.7 mmHg.  4. Left atrial size was severely dilated.  5. Degenerative mitral valve with mild-moderate mitral annular calcification. DVI 0.51, MVA by continuity 1.7 cm2, however LVOT TVI elevated, which may overestimate DVI and valve area due to hyperdynamic state and high flow velocities. The mitral valve is degenerative. Mild mitral valve regurgitation. Mild to moderate mitral stenosis. The mean mitral valve gradient is 6.0 mmHg with average heart rate of 77 bpm.  6. The inferior vena cava is normal in size with <50% respiratory variability, suggesting right atrial pressure of 8 mmHg. FINDINGS  Left Ventricle: Left ventricular ejection fraction, by estimation, is  65 to 70%. The left ventricle has normal function. The left ventricle has no regional wall motion abnormalities. The average left ventricular global longitudinal strain is 18.8 %. The  global longitudinal strain is normal. The left ventricular internal cavity size was normal in size. There is moderate asymmetric left ventricular hypertrophy of the basal-septal and and mild LVH in other segments. Left ventricular diastolic parameters are consistent with Grade II diastolic dysfunction (pseudonormalization). Elevated left atrial pressure. Right Ventricle: The right ventricular size  is normal. No increase in right ventricular wall thickness. Right ventricular systolic function is normal. There is normal pulmonary artery systolic pressure. The tricuspid regurgitant velocity is 2.53 m/s, and  with an assumed right atrial pressure of 8 mmHg, the estimated right ventricular systolic pressure is 123XX123 mmHg. Left Atrium: Left atrial size was severely dilated. Right Atrium: Right atrial size was normal in size. Pericardium: There is no evidence of pericardial effusion. Mitral Valve: Degenerative mitral valve with mild-moderate mitral annular calcification. DVI 0.51, MVA by continuity 1.7 cm2, however LVOT TVI elevated, which may overestimate DVI and valve area due to hyperdynamic state and high flow velocities. The mitral valve is degenerative in appearance. Mild to moderate mitral annular calcification. Mild mitral valve regurgitation. 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 MV peak gradient, 12.7 mmHg. The mean mitral valve gradient is 6.0 mmHg with average heart  rate of 77 bpm. Tricuspid Valve: The tricuspid valve is normal in structure. Tricuspid valve regurgitation is trivial. No evidence of tricuspid stenosis. Aortic Valve: Very severe aortic valve stenosis. DI is 0.26, impacted by hyperdynamic state and high flow velocities. The aortic valve is abnormal. There is severe calcifcation of the aortic valve. Aortic valve regurgitation is trivial. Aortic regurgitation PHT measures 382 msec. Very severe aortic valve stenosis. Aortic valve mean gradient measures 69.0 mmHg. Aortic valve peak gradient measures 112.4 mmHg. Aortic valve area, by VTI measures 0.81 cm. Pulmonic Valve: The pulmonic valve was not well visualized. Pulmonic valve regurgitation is trivial. No evidence of pulmonic stenosis. Aorta: The aortic root is normal in size and structure. Venous: The inferior vena cava is normal in size with less than 50% respiratory variability, suggesting right atrial pressure of 8 mmHg. IAS/Shunts: No  atrial level shunt detected by color flow Doppler.  LEFT VENTRICLE PLAX 2D LVIDd:         4.60 cm  Diastology LVIDs:         2.50 cm  LV e' medial:    5.44 cm/s LV PW:         1.10 cm  LV E/e' medial:  30.2 LV IVS:        1.40 cm  LV e' lateral:   4.57 cm/s LVOT diam:     2.00 cm  LV E/e' lateral: 36.0 LV SV:         99 LV SV Index:   49       2D Longitudinal Strain LVOT Area:     3.14 cm 2D Strain GLS (A2C):   18.3 %                         2D Strain GLS (A3C):   20.3 %                         2D Strain GLS (A4C):   17.8 %  2D Strain GLS Avg:     18.8 %                          3D Volume EF:                         3D EF:        61 %                         LV EDV:       154 ml                         LV ESV:       60 ml                         LV SV:        94 ml RIGHT VENTRICLE RV S prime:     17.30 cm/s TAPSE (M-mode): 3.1 cm LEFT ATRIUM              Index       RIGHT ATRIUM           Index LA diam:        5.20 cm  2.58 cm/m  RA Area:     12.90 cm LA Vol (A2C):   133.0 ml 65.98 ml/m RA Volume:   27.20 ml  13.49 ml/m LA Vol (A4C):   72.0 ml  35.72 ml/m LA Biplane Vol: 102.0 ml 50.60 ml/m  AORTIC VALVE AV Area (Vmax):    0.82 cm AV Area (Vmean):   0.91 cm AV Area (VTI):     0.81 cm AV Vmax:           530.00 cm/s AV Vmean:          345.000 cm/s AV VTI:            1.221 m AV Peak Grad:      112.4 mmHg AV Mean Grad:      69.0 mmHg LVOT Vmax:         139.00 cm/s LVOT Vmean:        99.650 cm/s LVOT VTI:          0.316 m LVOT/AV VTI ratio: 0.26 AI PHT:            382 msec  AORTA Ao Root diam: 3.10 cm Ao Asc diam:  3.70 cm MITRAL VALVE                TRICUSPID VALVE MV Area (PHT): cm          TR Peak grad:   25.6 mmHg MV Peak grad:  12.7 mmHg    TR Vmax:        253.00 cm/s MV Mean grad:  6.0 mmHg MV Vmax:       1.78 m/s     SHUNTS MV Vmean:      101.6 cm/s   Systemic VTI:  0.32 m MV Decel Time: 302 msec     Systemic Diam: 2.00 cm MV E velocity: 164.50 cm/s MV A velocity: 168.50 cm/s MV  E/A ratio:  0.98 Cherlynn Kaiser MD Electronically signed by Cherlynn Kaiser MD Signature Date/Time: 01/15/2021/11:30:02 PM    Final    VAS US CAROTID  Result Date: 02/01/2021 Carotid Arterial Duplex Study Indications:  Nonrheumatic aortic valve stenosis. Risk Factors:      Hypertension, hyperlipidemia. Comparison Study:  no prior Performing Technologist: [name] RVS  Examination Guidelines: A complete evaluation includes B-mode imaging, spectral Doppler, color Doppler, and power Doppler as needed of all accessible portions of each vessel. Bilateral testing is considered an integral part of a complete examination. Limited examinations for reoccurring indications may be performed as noted.  Right Carotid Findings: +----------+--------+--------+--------+------------------+--------+           PSV cm/sEDV cm/sStenosisPlaque DescriptionComments +----------+--------+--------+--------+------------------+--------+ CCA Prox  99      14              heterogenous               +----------+--------+--------+--------+------------------+--------+ CCA Distal73      22              heterogenous               +----------+--------+--------+--------+------------------+--------+ ICA Prox  82      24      1-39%   heterogenous               +----------+--------+--------+--------+------------------+--------+ ICA Distal77      37                                         +----------+--------+--------+--------+------------------+--------+ ECA       123     17                                         +----------+--------+--------+--------+------------------+--------+ +----------+--------+-------+--------+-------------------+           PSV cm/sEDV cmsDescribeArm Pressure (mmHG) +----------+--------+-------+--------+-------------------+ [id number]                                         +----------+--------+-------+--------+-------------------+  +---------+--------+--+--------+--+---------+ VertebralPSV cm/s36EDV cm/s10Antegrade +---------+--------+--+--------+--+---------+  Left Carotid Findings: +----------+--------+--------+--------+------------------+--------+           PSV cm/sEDV cm/sStenosisPlaque DescriptionComments +----------+--------+--------+--------+------------------+--------+ CCA Prox  90      14              heterogenous               +----------+--------+--------+--------+------------------+--------+ CCA Distal69      12              heterogenous               +----------+--------+--------+--------+------------------+--------+ ICA Prox  38      14      1-39%   heterogenous               +----------+--------+--------+--------+------------------+--------+ ICA Distal61      24                                         +----------+--------+--------+--------+------------------+--------+ ECA       228     37                                         +----------+--------+--------+--------+------------------+--------+ +----------+--------+--------+--------+-------------------+  PSV cm/sEDV cm/sDescribeArm Pressure (mmHG) +----------+--------+--------+--------+-------------------+ [id number]                                          +----------+--------+--------+--------+-------------------+ +---------+--------+--+--------+-+---------+ VertebralPSV cm/s33EDV cm/s7Antegrade +---------+--------+--+--------+-+---------+   Summary: Right Carotid: Velocities in the right ICA are consistent with a 1-39% stenosis. Left Carotid: Velocities in the left ICA are consistent with a 1-39% stenosis. Vertebrals: Bilateral vertebral arteries demonstrate antegrade flow. *See table(s) above for measurements and observations.  Electronically signed by Servando Snare MD on 02/01/2021 at 2:27:59 PM.    Final    ECHOCARDIOGRAM LIMITED  Result Date: 02/12/2021    ECHOCARDIOGRAM LIMITED REPORT    Patient Name:   MARCIANA Keesling Date of Exam: 02/12/2021 Medical Rec #:  [id number]   Height:       62.0 in Accession #:    [id number]  Weight:       225.5 lb Date of Birth:  1951/03/13   BSA:          2.012 m Patient Age:    89 years    BP:           148/64 mmHg Patient Gender: F           HR:           75 bpm. Exam Location:  Inpatient Procedure: Limited Echo, Cardiac Doppler and Color Doppler Indications:     I35.0 Nonrheumatic aortic (valve) stenosis  History:         Patient has prior history of Echocardiogram examinations, most                  recent 01/15/2021. Aortic Valve Disease and Mitral Valve                  Disease; Risk Factors:Dyslipidemia and Hypertension. Severe                  aortic stenosis. Mitral stenosis.  Sonographer:     Roseanna Rainbow RDCS Referring Phys:  [place] Diagnosing Phys: Sanda Klein MD  Sonographer Comments: Technically difficult study due to poor echo windows and patient is morbidly obese. Image acquisition challenging due to patient body habitus. TAVR procedure. PREOPERATIVE STUDY Normal left ventricular systolic function. Estimated LVEF 60-65%. Poorly seen aortic valve with severe degenerative changes Severe calcific aortic stenosis. Mild aortic insufficiency. Peak gradient 86 mm Hg, mean gradient 50 mm Hg, dimensionless index 0.26, calculated area 0.8 cm (0.4 cm indexed for BSA). Trivial mitral insufficiency. No pericardial effusion. POSTOPERATIVE STUDY Hyperdynamic left ventricular systolic function. Estimated LVEF >75%. Well seated stent valve (TAVR). There is a trivial perivalvular leak, posteriorly. Peak gradient 17 mm Hg, mean gradient 9 mm Hg, dimensionless index 0.76, calculated area 2.38 cm (1.19 cm indexed for BSA). Acceleration time 89 ms. Mild central mitral insufficiency. No pericardial effusion. IMPRESSIONS  1. Left ventricular ejection fraction, by estimation, is 60 to 65%. The left ventricle has normal function. The left ventricle has no regional wall  motion abnormalities. There is mild concentric left ventricular hypertrophy. Left ventricular diastolic parameters are consistent with Grade II diastolic dysfunction (pseudonormalization). Elevated left atrial pressure.  2. Right ventricular systolic function is normal. The right ventricular size is normal.  3. Trivial mitral valve regurgitation. No evidence of mitral stenosis. Moderate mitral annular calcification.  4. The aortic valve has an indeterminate number of cusps. Aortic valve regurgitation is  not visualized. Severe aortic valve stenosis. FINDINGS  Left Ventricle: Left ventricular ejection fraction, by estimation, is 60 to 65%. The left ventricle has normal function. The left ventricle has no regional wall motion abnormalities. The left ventricular internal cavity size was normal in size. There is  mild concentric left ventricular hypertrophy. Left ventricular diastolic parameters are consistent with Grade II diastolic dysfunction (pseudonormalization). Elevated left atrial pressure. Right Ventricle: The right ventricular size is normal. No increase in right ventricular wall thickness. Right ventricular systolic function is normal. Right Atrium: Right atrial size was not well visualized. Pericardium: There is no evidence of pericardial effusion. Mitral Valve: Moderate mitral annular calcification. Trivial mitral valve regurgitation. No evidence of mitral valve stenosis. Tricuspid Valve: The tricuspid valve is normal in structure. Tricuspid valve regurgitation is not demonstrated. Aortic Valve: The aortic valve has an indeterminant number of cusps. Aortic valve regurgitation is not visualized. Severe aortic stenosis is present. Aortic valve mean gradient measures 50.2 mmHg. Aortic valve peak gradient measures 86.3 mmHg. Aortic valve area, by VTI measures 0.80 cm. Pulmonic Valve: The pulmonic valve was not well visualized. Pulmonic valve regurgitation is not visualized. Aorta: The aortic root and ascending  aorta are structurally normal, with no evidence of dilitation. IAS/Shunts: The interatrial septum was not well visualized. LEFT VENTRICLE PLAX 2D LVOT diam:     2.00 cm LV SV:         92 LV SV Index:   46 LVOT Area:     3.14 cm  AORTIC VALVE AV Area (Vmax):    0.80 cm AV Area (Vmean):   0.82 cm AV Area (VTI):     0.80 cm AV Vmax:           464.44 cm/s AV Vmean:          334.199 cm/s AV VTI:            1.153 m AV Peak Grad:      86.3 mmHg AV Mean Grad:      50.2 mmHg LVOT Vmax:         117.99 cm/s LVOT Vmean:        86.997 cm/s LVOT VTI:          0.294 m LVOT/AV VTI ratio: 0.26  SHUNTS Systemic VTI:  0.29 m Systemic Diam: 2.00 cm Sanda Klein MD Electronically signed by Sanda Klein MD Signature Date/Time: 02/12/2021/6:19:11 PM    Final    Structural Heart Procedure  Result Date: 02/12/2021 See surgical note for result.  CT ANGIO ABDOMEN PELVIS  W &/OR WO CONTRAST  Result Date: 02/02/2021 CLINICAL DATA:  70 year old female with history of severe aortic stenosis. Preprocedural study prior to potential transcatheter aortic valve replacement (TAVR) procedure. EXAM: CT ANGIOGRAPHY CHEST, ABDOMEN AND PELVIS TECHNIQUE: Multidetector CT imaging through the chest, abdomen and pelvis was performed using the standard protocol during bolus administration of intravenous contrast. Multiplanar reconstructed images and MIPs were obtained and reviewed to evaluate the vascular anatomy. CONTRAST:  159mL OMNIPAQUE IOHEXOL 350 MG/ML SOLN COMPARISON:  No priors. FINDINGS: CTA CHEST FINDINGS Cardiovascular: Heart size is mildly enlarged. There is no significant pericardial fluid, thickening or pericardial calcification. Aortic atherosclerosis. No definite coronary artery calcifications. Severe thickening and calcification of the aortic valve. Severe calcifications of the mitral annulus. Mediastinum/Lymph Nodes: No pathologically enlarged mediastinal or hilar lymph nodes. Esophagus is unremarkable in appearance.  Lungs/Pleura: Right-sided Bochdalek's hernia. Scattered areas of mild linear scarring are noted in the lung bases bilaterally. No acute consolidative airspace disease.  No pleural effusions. No suspicious appearing pulmonary nodules or masses are noted. Musculoskeletal/Soft Tissues: There are no aggressive appearing lytic or blastic lesions noted in the visualized portions of the skeleton. CTA ABDOMEN AND PELVIS FINDINGS Hepatobiliary: No suspicious cystic or solid hepatic lesions. No intra or extrahepatic biliary ductal dilatation. Gallbladder is normal in appearance. Pancreas: No pancreatic mass. No pancreatic ductal dilatation. No pancreatic or peripancreatic fluid collections or inflammatory changes. Spleen: Unremarkable. Adrenals/Urinary Tract: Horseshoe kidney (normal anatomical variant). No suspicious renal lesions. No hydroureteronephrosis. Bilateral adrenal glands are normal in appearance. Urinary bladder is nearly decompressed, but otherwise unremarkable in appearance. Stomach/Bowel: The appearance of the stomach is normal. No pathologic dilatation of small bowel or colon. A few scattered colonic diverticulae are noted, without surrounding inflammatory changes to suggest an acute diverticulitis at this time. The appendix is not confidently identified and may be surgically absent. Regardless, there are no inflammatory changes noted adjacent to the cecum to suggest the presence of an acute appendicitis at this time. Vascular/Lymphatic: Aortic atherosclerosis, without evidence of aneurysm or dissection noted in the abdominal or pelvic vasculature. Vascular findings and measurements pertinent to potential TAVR procedure, as detailed the low. No lymphadenopathy noted in the abdomen or pelvis. Reproductive: Status post hysterectomy. Right ovary is not confidently identified may be surgically absent or atrophic. Well-defined low-attenuation left adnexal lesion measuring 3.2 x 2.5 x 3.2 cm, likely a cyst. Other: No  significant volume of ascites.  No pneumoperitoneum. Musculoskeletal: Status post PLIF at L4-L5. There are no aggressive appearing lytic or blastic lesions noted in the visualized portions of the skeleton. VASCULAR MEASUREMENTS PERTINENT TO TAVR: AORTA: Minimal Aortic Diameter-12 x 14 mm Severity of Aortic Calcification-moderate RIGHT PELVIS: Right Common Iliac Artery - Minimal Diameter-9.7 x 9.3 mm Tortuosity-mild Calcification-mild Right External Iliac Artery - Minimal Diameter-8.8 x 8.7 mm Tortuosity-severe Calcification-none Right Common Femoral Artery - Minimal Diameter-9.3 x 8.3 mm Tortuosity-mild Calcification-mild LEFT PELVIS: Left Common Iliac Artery - Minimal Diameter-10.4 x 8.8 mm Tortuosity-mild Calcification-mild Left External Iliac Artery - Minimal Diameter-8.6 x 8.8 mm Tortuosity-severe Calcification-none Left Common Femoral Artery - Minimal Diameter-9.0 x 9.0 mm Tortuosity-mild Calcification-none Review of the MIP images confirms the above findings. IMPRESSION: 1. Vascular findings and measurements pertinent to potential TAVR procedure, as detailed above. 2. Severe thickening calcification of the aortic valve, compatible with reported clinical history of severe aortic stenosis. 3. Horseshoe kidney, normal anatomical variant, incidentally noted. 4. Colonic diverticulosis without evidence of acute diverticulitis at this time. 5. Additional incidental findings, as above. Electronically Signed   By: Vinnie Langton M.D.   On: 02/02/2021 07:09   Disposition   Pt is being discharged home today in good condition.  Follow-up Plans & Appointments     Follow-up Information    Eileen Stanford, PA-C. Go on 02/20/2021.   Specialties: Cardiology, Radiology Why: @ 2:30pm, please arrive at least 10 minutes early.  Contact information: [address] [place] [address] [phone]                Discharge Medications   Allergies as of 02/13/2021      Reactions    Dobutamine    Decreases heart rate    Latex Rash   Sulfa Antibiotics Rash      Medication List    STOP taking these medications   omeprazole 40 MG capsule Commonly known as: PRILOSEC Replaced by: pantoprazole 40 MG tablet     TAKE these medications   acetaminophen 500 MG tablet  Commonly known as: TYLENOL Take 1,000 mg by mouth See admin instructions. Take 1000 mg daily, may take a second 1000 mg dose as needed for pain   amLODipine 10 MG tablet Commonly known as: NORVASC Take 10 mg by mouth daily.   Apple Cider Vinegar 500 MG Tabs Take 500 mg by mouth daily.   aspirin 81 MG chewable tablet Chew 1 tablet (81 mg total) by mouth daily. Start taking on: February 14, 2021   B-12 500 MCG Subl Place 500 mcg under the tongue daily.   clopidogrel 75 MG tablet Commonly known as: PLAVIX Take 1 tablet (75 mg total) by mouth daily with breakfast. Start taking on: February 14, 2021   DULoxetine 60 MG capsule Commonly known as: CYMBALTA Take 60 mg by mouth daily.   ESTER C PO Take 1,000 mg by mouth daily.   indapamide 2.5 MG tablet Commonly known as: LOZOL Take 2.5 mg by mouth daily.   Magnesium 100 MG Caps Take 100 mg by mouth daily.   meloxicam 15 MG tablet Commonly known as: MOBIC Take 15 mg by mouth daily.   pantoprazole 40 MG tablet Commonly known as: PROTONIX Take 1 tablet (40 mg total) by mouth daily. Start taking on: February 14, 2021 Replaces: omeprazole 40 MG capsule   rosuvastatin 10 MG tablet Commonly known as: CRESTOR Take 10 mg by mouth daily.   telmisartan 80 MG tablet Commonly known as: MICARDIS Take 80 mg by mouth daily.   Turmeric 500 MG Caps Take 500 mg by mouth daily.   Vitamin D 50 MCG (2000 UT) tablet Take 2,000 Units by mouth daily.       Outstanding Labs/Studies   none  Duration of Discharge Encounter   Greater than 30 minutes including physician time.  Mable Fill, PA-C 02/13/2021, 12:30 PM [phone]  Patient  seen, examined. Available data reviewed. Agree with findings, assessment, and plan as outlined by Nell Range, PA-C.  The patient is independently interviewed and examined.  She is alert, oriented, in no distress.  The patient is sitting up in a chair at the bedside.  JVP is normal, lungs are clear bilaterally, heart is regular rate and rhythm with a grade 2/6 mid peaking systolic murmur at the right upper sternal border, abdomen is soft, obese, nontender, extremities have trace pretibial edema.  Bilateral groin sites are clear with no ecchymosis or hematoma.  Telemetry is reviewed and demonstrates normal sinus rhythm with no bradycardic events.  The patient's temporary transvenous pacemaker catheter was pulled this morning.  This morning's EKG demonstrates normal sinus rhythm with a QRS duration of 98 ms and a normal PR interval.  There is no evidence of any ongoing issue with heart block or significant conduction disease.  I think the patient is stable for hospital discharge today.  We discussed the importance of adherence to dual antiplatelet therapy with aspirin and clopidogrel for a period of 6 months.  Pantoprazole will be used instead of omeprazole to avoid interaction with clopidogrel.  The patient is advised to avoid driving for a period of at least 1 week.  Follow-up is arranged as above.  Her postoperative echo is reviewed with formal interpretation currently pending.  There is trivial paravalvular regurgitation and a normal transvalvular mean gradient of 10 mmHg.  Sherren Mocha, M.D. 02/13/2021 1:05 PM

## 2021-02-13 NOTE — Discharge Instructions (Signed)
Prilosec was changed to Protonix given potential drug drug interaction with Plavix. You can resume Prilosec when you finish your 6 months of Plavix therapy.    ACTIVITY AND EXERCISE . Daily activity and exercise are an important part of your recovery. People recover at different rates depending on their general health and type of valve procedure. . Most people recovering from TAVR feel better relatively quickly  . No lifting, pushing, pulling more than 10 pounds (examples to avoid: groceries, vacuuming, gardening, golfing):             - For one week with a procedure through the groin.             - For six weeks for procedures through the chest wall or neck. NOTE: You will typically see one of our providers 7-14 days after your procedure to discuss [place] the above activities.      DRIVING . Do not drive until you are seen for follow up and cleared by a provider. Generally, we ask patient to not drive for 1 week after their procedure. . If you have been told by your doctor in the past that you may not drive, you must talk with him/her before you begin driving again.   DRESSING . Groin site: you may leave the clear dressing over the site for up to one week or until it falls off.   HYGIENE . If you had a femoral (leg) procedure, you may take a shower when you return home. After the shower, pat the site dry. Do NOT use powder, oils or lotions in your groin area until the site has completely healed. . If you had a chest procedure, you may shower when you return home unless specifically instructed not to by your discharging practitioner.             - DO NOT scrub incision; pat dry with a towel.             - DO NOT apply any lotions, oils, powders to the incision.             - No tub baths / swimming for at least 2 weeks. . If you notice any fevers, chills, increased pain, swelling, bleeding or pus, please contact your doctor.   ADDITIONAL INFORMATION . If you are going to have an  upcoming dental procedure, please contact our office as you will require antibiotics ahead of time to prevent infection on your heart valve.    If you have any questions or concerns you can call the structural heart phone during normal business hours 8am-4pm. If you have an urgent need after hours or weekends please call [phone] to talk to the on call provider for general cardiology. If you have an emergency that requires immediate attention, please call 911.    After TAVR Checklist  Check  Test Description   Follow up appointment in 1-2 weeks  You will see our structural heart physician assistant, Nell Range. Your incision sites will be checked and you will be cleared to drive and resume all normal activities if you are doing well.     1 month echo and follow up  You will have an echo to check on your new heart valve and be seen back in the office by Nell Range. Many times the echo is not read by your appointment time, but Joellen Jersey will call you later that day or the following day to report your results.   Follow  up with your primary cardiologist You will need to be seen by your primary cardiologist in the following 3-6 months after your 1 month appointment in the valve clinic. Often times your Plavix or Aspirin will be discontinued during this time, but this is decided on a case by case basis.    1 year echo and follow up You will have another echo to check on your heart valve after 1 year and be seen back in the office by Nell Range. This your last structural heart visit.   Bacterial endocarditis prophylaxis  You will have to take antibiotics for the rest of your life before all dental procedures (even teeth cleanings) to protect your heart valve. Antibiotics are also required before some surgeries. Please check with your cardiologist before scheduling any surgeries. Also, please make sure to tell us if you have a penicillin allergy as you will require an alternative antibiotic.

## 2021-02-13 NOTE — Progress Notes (Signed)
Zio patch placed onto patient.  All instructions and information reviewed with patient, they verbalize understanding with no questions. 

## 2021-02-13 NOTE — Progress Notes (Signed)
  Echocardiogram 2D Echocardiogram has been performed.  Katherine Waters 02/13/2021, 12:26 PM

## 2021-02-13 NOTE — Progress Notes (Addendum)
CARDIAC REHAB PHASE I   PRE:  Rate/Rhythm: 87 SR  BP:  Sitting: 111/91      SaO2: 96 RA  MODE:  Ambulation: 190 ft   POST:  Rate/Rhythm: 102 ST  BP:  Sitting: 136/65    SaO2: 97 RA   Pt ambulated 177ft in hallway handheld assist with steady gait. Pt states improvement in her breathing, does not some weakness in her legs. Pt returned to recliner. Educated on site care, restrictions, and exercise guidelines. Will refer to CRP II [place].   [id number] Rufina Falco, RN BSN 02/13/2021 1:37 PM

## 2021-02-14 ENCOUNTER — Telehealth: Payer: Self-pay | Admitting: Physician Assistant

## 2021-02-14 MED FILL — Magnesium Sulfate Inj 50%: INTRAMUSCULAR | Qty: 10 | Status: AC

## 2021-02-14 MED FILL — Potassium Chloride Inj 2 mEq/ML: INTRAVENOUS | Qty: 40 | Status: AC

## 2021-02-14 MED FILL — Heparin Sodium (Porcine) Inj 1000 Unit/ML: INTRAMUSCULAR | Qty: 30 | Status: AC

## 2021-02-14 NOTE — Anesthesia Postprocedure Evaluation (Signed)
Anesthesia Post Note  Patient: Katherine Waters  Procedure(s) Performed: TRANSCATHETER AORTIC VALVE REPLACEMENT, TRANSFEMORAL (N/A Groin) INTRAOPERATIVE TRANSTHORACIC ECHOCARDIOGRAM (Left Chest) ULTRASOUND GUIDANCE FOR VASCULAR ACCESS (Bilateral Groin)     Patient location during evaluation: SICU Anesthesia Type: MAC Level of consciousness: awake and alert Pain management: pain level controlled Vital Signs Assessment: post-procedure vital signs reviewed and stable Respiratory status: spontaneous breathing, nonlabored ventilation, respiratory function stable and patient connected to nasal cannula oxygen Cardiovascular status: stable and blood pressure returned to baseline Postop Assessment: no apparent nausea or vomiting Anesthetic complications: no   No complications documented.  Last Vitals:  Vitals:   02/13/21 1200 02/13/21 1300  BP: 139/85   Pulse: 79   Resp: 14   Temp:  36.7 C  SpO2: 93%     Last Pain:  Vitals:   02/13/21 1300  TempSrc: Oral  PainSc:                  [name]

## 2021-02-14 NOTE — Telephone Encounter (Signed)
  [facility] VALVE TEAM   Patient contacted regarding discharge from [facility] on 4/27  Patient understands to follow up with provider Nell Range on 5/4 at [facility].  Patient understands discharge instructions? yes Patient understands medications and regimen? yes Patient understands to bring all medications to this visit? Yes  A little weak but overall doing okay.   Angelena Form PA-C  [facility]

## 2021-02-15 ENCOUNTER — Telehealth (HOSPITAL_COMMUNITY): Payer: Self-pay

## 2021-02-15 NOTE — Telephone Encounter (Signed)
Called patient to see if she is interested in the Cardiac Rehab Program. Patient expressed interest. Explained scheduling process and went over insurance, patient verbalized understanding. Will contact patient for scheduling once f/u has been completed. 

## 2021-02-15 NOTE — Telephone Encounter (Signed)
Pt insurance is active and benefits verified through Medicare A/B. Co-pay $0.00, DED $233.00/$233.00 met, out of pocket $0.00/$0.00 met, co-insurance 20%. No pre-authorization required. Passport, 02/15/21 @ 2:13PM, REF#[id number]  2ndary insurance is active and benefits verified through [employer]. Co-pay $0.00, DED $0.00/$0.00 met, out of pocket $0.00/$0.00 met, co-insurance 0%. No pre-authorization required. Passport, 02/15/21 @ 2:16PM, REF#[id number]  Will contact patient to see if she is interested in the Cardiac Rehab Program. If interested, patient will need to complete follow up appt. Once completed, patient will be contacted for scheduling upon review by the RN Navigator. 

## 2021-02-19 NOTE — Progress Notes (Signed)
[name]                                     Cardiology Office Note:    Date:  02/20/2021   ID:  Katherine Waters, DOB 1951/06/17, MRN [id number]  PCP:  Maude Leriche, [facility] Cardiologist:  Evalina Field, MD  / Dr. Burt Knack & Dr. Roxy Manns (TAVR)   [place] Electrophysiologist:  None   Referring MD: Scifres, Katherine Server, PA-C   [facility] s/p TAVR  History of Present Illness:    Katherine Waters is a 71 y.o. 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  The patient states that she Waters noted the presence of a heart murmur for many years. She was followed for several years by a cardiologist in [place] with known diagnosis of aortic stenosis that had gradually progressed in severity. She also Waters a long history of dyspnea on exertion. Symptoms have progressed fairly dramatically over the last few months to the point where recently the patient gets short of breath with very low level activity and occasionally at rest. She recently moved from [place] to [facility] and establish care with Dr. Farris Waters. Routine follow-up transthoracic echocardiogram performed January 15, 2021 confirmed the presence of severe aortic stenosis. Peak velocity across aortic valve measured at size 5.3 m/s corresponding to mean transvalvular gradient estimated 69 mmHg and aortic valve area calculated 0.81 cm by VTI. The DVI was reported 0.26 with stroke-volume index 49. Left ventricular systolic function was hyperdynamic with ejection fraction estimated 65 to 70%. The patient was referred to the multidisciplinary heart valve clinic and Waters been evaluated previously by Dr. Burt Knack. Diagnostic cardiac catheterization was performed January 29, 2021 and confirmed presence of severe aortic stenosis.There was normal  coronary artery anatomy with no flow-limiting coronary artery disease. Right heart pressures were mildly elevated.  She was evaluated by the multidisciplinary valve team and underwent successful TAVR with a82mm Evolut Pro +THV via the TF approach on 02/12/21. Procedure was c/b transient CHB. Temp wire was left in place but HR recovered with narrow QRS/PR. Post operative echo showed EF 60%, normally functioning TAVR with a mean gradient of 9.3 mmHg and no PVL. She was started on aspirin and plavix and discharged with a Zio Patch.  Today she presents to clinic for follow up. Here with husband. Felt a little depressed over the weekend bc still having some fatigue and dyspnea although it Waters improved some. No CP. No LE edema, orthopnea or PND. No dizziness or syncope. No blood in stool or urine. No palpitations but does feel her heart start to race when she does physical activity.    Past Medical History:  Diagnosis Date  . Arthritis    osteoarthritis  . Asthma   . Connective tissue disease ([place])   . Depression   . Fibromyalgia   . GERD (gastroesophageal reflux disease)   . Hyperlipidemia   . Hypertension   . PONV (postoperative nausea and vomiting)   . Raynaud disease   . 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 Burt Knack and Dr. Roxy Manns   . Severe aortic stenosis     Past  Surgical History:  Procedure Laterality Date  . ABDOMINAL HYSTERECTOMY    . CARDIAC CATHETERIZATION    . CATARACT EXTRACTION, BILATERAL    . EYE SURGERY    . INTRAOPERATIVE TRANSTHORACIC ECHOCARDIOGRAM Left 02/12/2021   Procedure: INTRAOPERATIVE TRANSTHORACIC ECHOCARDIOGRAM;  Surgeon: Sherren Mocha, MD;  Location: [place];  Service: Open Heart Surgery;  Laterality: Left;  . LUMBAR FUSION    . POSTERIOR TIBIAL TENDON REPAIR    . RIGHT/LEFT HEART CATH AND CORONARY ANGIOGRAPHY N/A 01/29/2021   Procedure: RIGHT/LEFT HEART CATH AND  CORONARY ANGIOGRAPHY;  Surgeon: Belva Crome, MD;  Location: [place] [facility];  Service: Cardiovascular;  Laterality: N/A;  . ROTATOR CUFF REPAIR    . TRANSCATHETER AORTIC VALVE REPLACEMENT, TRANSFEMORAL N/A 02/12/2021   Procedure: TRANSCATHETER AORTIC VALVE REPLACEMENT, TRANSFEMORAL;  Surgeon: Sherren Mocha, MD;  Location: [place];  Service: Open Heart Surgery;  Laterality: N/A;  . TUBAL LIGATION    . ULTRASOUND GUIDANCE FOR VASCULAR ACCESS Bilateral 02/12/2021   Procedure: ULTRASOUND GUIDANCE FOR VASCULAR ACCESS;  Surgeon: Sherren Mocha, MD;  Location: [place];  Service: Open Heart Surgery;  Laterality: Bilateral;    Current Medications: Current Meds  Medication Sig  . acetaminophen (TYLENOL) 500 MG tablet Take 1,000 mg by mouth See admin instructions. Take 1000 mg daily, may take a second 1000 mg dose as needed for pain  . amLODipine (NORVASC) 10 MG tablet Take 10 mg by mouth daily.  Marland Kitchen amoxicillin (AMOXIL) 500 MG tablet Take 4 tablets (2,000 mg total) one hour prior to all dental visits.  . Apple Cider Vinegar 500 MG TABS Take 500 mg by mouth daily.  Marland Kitchen aspirin 81 MG chewable tablet Chew 1 tablet (81 mg total) by mouth daily.  Marland Kitchen Bioflavonoid Products (ESTER C PO) Take 1,000 mg by mouth daily.  . Cholecalciferol (VITAMIN D) 50 MCG (2000 UT) tablet Take 2,000 Units by mouth daily.  . clopidogrel (PLAVIX) 75 MG tablet Take 1 tablet (75 mg total) by mouth daily with breakfast.  . Cyanocobalamin (B-12) 500 MCG SUBL Place 500 mcg under the tongue daily.  . DULoxetine (CYMBALTA) 60 MG capsule Take 60 mg by mouth daily.  . indapamide (LOZOL) 2.5 MG tablet Take 2.5 mg by mouth daily.  . Magnesium 100 MG CAPS Take 100 mg by mouth daily.  . meloxicam (MOBIC) 15 MG tablet Take 15 mg by mouth daily.  . pantoprazole (PROTONIX) 40 MG tablet Take 1 tablet (40 mg total) by mouth daily.  . rosuvastatin (CRESTOR) 10 MG tablet Take 10 mg by mouth daily.  Marland Kitchen telmisartan (MICARDIS) 80 MG tablet Take 80 mg by  mouth daily.  . Turmeric 500 MG CAPS Take 500 mg by mouth daily.     Allergies:   Dobutamine, Latex, and Sulfa antibiotics   Social History   Socioeconomic History  . Marital status: Married    Spouse name: Not on file  . Number of children: 1  . Years of education: Not on file  . Highest education level: Not on file  Occupational History  . Occupation: retired bookkeeper  Tobacco Use  . Smoking status: Former Smoker    Packs/day: 0.50    Years: 6.00    Pack years: 3.00    Quit date: 1976    Years since quitting: 46.3  . Smokeless tobacco: Never Used  Vaping Use  . Vaping Use: Never used  Substance and Sexual Activity  . Alcohol use: Never    Comment: occasional  . Drug use: Not  Currently  . Sexual activity: Not on file  Other Topics Concern  . Not on file  Social History Narrative  . Not on file   Social Determinants of Health   Financial Resource Strain: Not on file  Food Insecurity: Not on file  Transportation Needs: Not on file  Physical Activity: Not on file  Stress: Not on file  Social Connections: Not on file     Family History: The patient's family history includes COPD in her mother; Cancer in her sister; Cirrhosis in her brother; Heart attack (age of onset: 20) in her father; Heart disease in her brother.  ROS:   Please see the history of present illness.    All other systems reviewed and are negative.  EKGs/Labs/Other Studies Reviewed:    The following studies were reviewed today:   TAVR OPERATIVE NOTE   Date of Procedure:09/04/2020  Preoperative Diagnosis:Severe Aortic Stenosis   Postoperative Diagnosis:Same   Procedure:   Transcatheter Aortic Valve Replacement - Percutaneous Transfemoral Approach Medtronic Evolut Pro-Plus (size 52mm, serial # [id number])  Co-Surgeons:Clarence H. Roxy Manns, MD and Sherren Mocha,  MD  Anesthesiologist:Adam Marcie Bal, MD  Echocardiographer:Mihai Croitoru, MD  Pre-operative Echo Findings: ? Severe aortic stenosis ? Normalleft ventricular systolic function  Post-operative Echo Findings: ? Traceparavalvular leak ? Normal/unchangedleft ventricular systolic function  ____________________  Echo 02/13/21: IMPRESSIONS  1. Left ventricular ejection fraction, by estimation, is 60 to 65%. The  left ventricle Waters normal function. The left ventricle Waters no regional  wall motion abnormalities. There is mild concentric left ventricular  hypertrophy. Left ventricular diastolic  parameters are consistent with Grade I diastolic dysfunction (impaired  relaxation). Elevated left atrial pressure.  2. Right ventricular systolic function is normal. The right ventricular  size is normal. Tricuspid regurgitation signal is inadequate for assessing  PA pressure.  3. Left atrial size was moderately dilated.  4. The mitral valve is normal in structure. No evidence of mitral valve  regurgitation. Mild mitral stenosis. The mean mitral valve gradient is 5.8  mmHg with average heart rate of 75 bpm. Moderate to severe mitral annular  calcification.  5. The aortic valve Waters been repaired/replaced. Aortic valve  regurgitation is trivial. There is a 29 mm Medtronic stented (TAVR) valve  present in the aortic position. Procedure Date: 02/12/2021. Aortic valve  mean gradient measures 9.3 mmHg. Aortic valve  Vmax measures 2.11 m/s. Aortic valve acceleration time measures 77 msec.  6. The inferior vena cava is normal in size with greater than 50%  respiratory variability, suggesting right atrial pressure of 3 mmHg.    EKG:  EKG is  ordered today.  The ekg ordered today demonstrates sinus with PACs HR 86  Recent Labs: 01/10/2021: TSH 1.160 02/08/2021: ALT 20 02/13/2021: BUN 12; Creatinine, Ser 0.67; Hemoglobin 12.0; Magnesium 1.9; Platelets 201;  Potassium 3.8; Sodium 137  Recent Lipid Panel    Component Value Date/Time   CHOL 163 01/10/2021 1050   TRIG 98 01/10/2021 1050   HDL 70 01/10/2021 1050   CHOLHDL 2.3 01/10/2021 1050   LDLCALC 75 01/10/2021 1050     Risk Assessment/Calculations:       Physical Exam:    VS:  BP 130/66   Pulse 86   Ht 5\' 2"  (1.575 m)   Wt 223 lb 9.6 oz (101.4 kg)   SpO2 97%   BMI 40.90 kg/m     Wt Readings from Last 3 Encounters:  02/20/21 223 lb 9.6 oz (101.4 kg)  02/13/21 227 lb 8.2 oz (103.2  kg)  02/08/21 225 lb 8 oz (102.3 kg)     GEN:  Well nourished, well developed in no acute distress, obese HEENT: Normal NECK: No JVD; LYMPHATICS: No lymphadenopathy CARDIAC: RRR, soft flow murmur @ RUSB. No, rubs, gallops RESPIRATORY:  Clear to auscultation without rales, wheezing or rhonchi  ABDOMEN: Soft, non-tender, non-distended MUSCULOSKELETAL:  No edema; No deformity  SKIN: Warm and dry. Groin sites healed  NEUROLOGIC:  Alert and oriented x 3 PSYCHIATRIC:  Normal affect   ASSESSMENT:    1. S/P TAVR (transcatheter aortic valve replacement)   2. Transient complete heart block (HCC)   3. Primary hypertension   4. Other fatigue    PLAN:    In order of problems listed above:  Severe AS s/p TAVR:  Doing well as side from ongoing fatigue. Groin sites healing well. ECG with no HAVB. SBE prophylaxis discussed; I have RX'd amoxicillin. Continue on aspirin and plavix.  I will see her back in 1 month for echo and follow up.   Transient CHB: no high risk alerts on Zip patch and ECG stable today.   HTN: BP well controlled. No changes made.   Fatigue: I think she Waters a component of deconditioning. I have told her to focus on exercise and weight loss. I also encouraged participation in cardiac rehab  Medication Adjustments/Labs and Tests Ordered: Current medicines are reviewed at length with the patient today.  Concerns regarding medicines are outlined above.  Orders Placed This Encounter   Procedures  . EKG 12-Lead   Meds ordered this encounter  Medications  . amoxicillin (AMOXIL) 500 MG tablet    Sig: Take 4 tablets (2,000 mg total) one hour prior to all dental visits.    Dispense:  8 tablet    Refill:  12    Patient Instructions  Medication Instructions:  1) Your provider discussed the importance of taking an antibiotic prior to all dental visits to prevent damage to the heart valves from infection. You were given a prescription for AMOXICILLIN 2,000 mg to take one hour prior to any dental appointment.  *If you need a refill on your cardiac medications before your next appointment, please call your pharmacy*  Follow-Up: Please keep your appointments as scheduled!    Signed, Angelena Form, PA-C  02/20/2021 4:51 PM    [facility]

## 2021-02-20 ENCOUNTER — Ambulatory Visit: Payer: Medicare Other | Admitting: Physician Assistant

## 2021-02-20 ENCOUNTER — Other Ambulatory Visit: Payer: Self-pay

## 2021-02-20 ENCOUNTER — Encounter: Payer: Self-pay | Admitting: Physician Assistant

## 2021-02-20 ENCOUNTER — Ambulatory Visit (INDEPENDENT_AMBULATORY_CARE_PROVIDER_SITE_OTHER): Payer: Medicare Other | Admitting: Physician Assistant

## 2021-02-20 VITALS — BP 130/66 | HR 86 | Ht 62.0 in | Wt 223.6 lb

## 2021-02-20 DIAGNOSIS — I10 Essential (primary) hypertension: Secondary | ICD-10-CM

## 2021-02-20 DIAGNOSIS — I442 Atrioventricular block, complete: Secondary | ICD-10-CM | POA: Diagnosis not present

## 2021-02-20 DIAGNOSIS — R5383 Other fatigue: Secondary | ICD-10-CM | POA: Diagnosis not present

## 2021-02-20 DIAGNOSIS — Z952 Presence of prosthetic heart valve: Secondary | ICD-10-CM | POA: Diagnosis not present

## 2021-02-20 DIAGNOSIS — K219 Gastro-esophageal reflux disease without esophagitis: Secondary | ICD-10-CM

## 2021-02-20 MED ORDER — AMOXICILLIN 500 MG PO TABS: ORAL_TABLET | ORAL | 12 refills | Status: DC

## 2021-02-20 NOTE — Patient Instructions (Signed)
Medication Instructions:  1) Your provider discussed the importance of taking an antibiotic prior to all dental visits to prevent damage to the heart valves from infection. You were given a prescription for AMOXICILLIN 2,000 mg to take one hour prior to any dental appointment.  *If you need a refill on your cardiac medications before your next appointment, please call your pharmacy*  Follow-Up: Please keep your appointments as scheduled!

## 2021-02-21 ENCOUNTER — Ambulatory Visit: Payer: Medicare Other | Admitting: Physician Assistant

## 2021-03-07 ENCOUNTER — Ambulatory Visit: Payer: Medicare Other | Admitting: Physician Assistant

## 2021-03-13 ENCOUNTER — Ambulatory Visit: Payer: Medicare Other | Admitting: Physician Assistant

## 2021-03-13 ENCOUNTER — Encounter: Payer: Self-pay | Admitting: Physician Assistant

## 2021-03-13 ENCOUNTER — Ambulatory Visit (HOSPITAL_COMMUNITY): Payer: Medicare Other | Attending: Physician Assistant

## 2021-03-13 ENCOUNTER — Other Ambulatory Visit (HOSPITAL_COMMUNITY): Payer: Medicare Other

## 2021-03-13 ENCOUNTER — Other Ambulatory Visit: Payer: Self-pay

## 2021-03-13 ENCOUNTER — Ambulatory Visit (INDEPENDENT_AMBULATORY_CARE_PROVIDER_SITE_OTHER): Payer: Medicare Other | Admitting: Physician Assistant

## 2021-03-13 VITALS — BP 140/68 | HR 83 | Ht 62.0 in | Wt 226.4 lb

## 2021-03-13 DIAGNOSIS — Z952 Presence of prosthetic heart valve: Secondary | ICD-10-CM

## 2021-03-13 DIAGNOSIS — I442 Atrioventricular block, complete: Secondary | ICD-10-CM | POA: Diagnosis not present

## 2021-03-13 DIAGNOSIS — R5383 Other fatigue: Secondary | ICD-10-CM

## 2021-03-13 DIAGNOSIS — I10 Essential (primary) hypertension: Secondary | ICD-10-CM

## 2021-03-13 LAB — ECHOCARDIOGRAM COMPLETE
AV Mean grad: 6.3 mmHg
AV Peak grad: 13.4 mmHg
Ao pk vel: 1.83 m/s
Area-P 1/2: 2.16 cm2
P 1/2 time: 930 msec
S' Lateral: 1.6 cm

## 2021-03-13 MED ORDER — METOPROLOL SUCCINATE ER 25 MG PO TB24: 25.0000 mg | ORAL_TABLET | Freq: Every day | ORAL | 3 refills | Status: DC

## 2021-03-13 NOTE — Progress Notes (Signed)
[facility] [place]                                     Cardiology Office Note:    Date:  03/14/2021   ID:  Ariyanah Aguado, DOB 1951-04-28, MRN [id number]  PCP:  Maude Leriche, PA-C  [facility] Cardiologist:  Evalina Field, MD  / Dr. Burt Knack & Dr. Roxy Manns (TAVR)   [place] Electrophysiologist:  None   Referring MD: Scifres, Earlie Server, PA-C   1 month s/p TAVR  History of Present Illness:    Katherine Waters is a 70 y.o. 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  The patient states that she has noted the presence of a heart murmur for many years. She was followed for several years by a cardiologist in [place] with known diagnosis of aortic stenosis that had gradually progressed in severity. She also has a long history of dyspnea on exertion. Symptoms have progressed fairly dramatically over the last few months to the point where recently the patient gets short of breath with very low level activity and occasionally at rest. She recently moved from [place] to [facility] and establish care with Dr. Audie Box. Routine follow-up transthoracic echocardiogram performed January 15, 2021 confirmed the presence of severe aortic stenosis. Peak velocity across aortic valve measured at size 5.3 m/s corresponding to mean transvalvular gradient estimated 69 mmHg and aortic valve area calculated 0.81 cm by VTI. The DVI was reported 0.26 with stroke-volume index 49. Left ventricular systolic function was hyperdynamic with ejection fraction estimated 65 to 70%. The patient was referred to the multidisciplinary heart valve clinic and has been evaluated previously by Dr. Burt Knack. Diagnostic cardiac catheterization was performed January 29, 2021 and confirmed presence of severe aortic stenosis.There was normal  coronary artery anatomy with no flow-limiting coronary artery disease. Right heart pressures were mildly elevated.  She was evaluated by the multidisciplinary valve team and underwent successful TAVR with a37mm Evolut Pro +THV via the TF approach on 02/12/21. Procedure was c/b transient CHB. Temp wire was left in place but HR recovered with narrow QRS/PR. Post operative echo showed EF 60%, normally functioning TAVR with a mean gradient of 9.3 mmHg and no PVL. She was started on aspirin and plavix and discharged with a Zio Patch, which did not show any recurrent HAVB but did have some short runs of NSVT and SVTs.   Today she presents to clinic for follow up. Feeling a bit better than last visit. Still having some fatigue and shortness of breath. Dyspnea is better but not gone. She also feels like her heart races sometimes. She has been walking at least a half mile each day. She does feel a little discouraged thinking that she would be feeling better with valve surgery.    Past Medical History:  Diagnosis Date  . Arthritis    osteoarthritis  . Asthma   . Connective tissue disease ([place])   . Depression   . Fibromyalgia   . GERD (gastroesophageal reflux disease)   . Hyperlipidemia   . Hypertension   . PONV (postoperative nausea and vomiting)   . Raynaud disease   . 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 Burt Knack and Dr. Roxy Manns   . Severe aortic stenosis     Past Surgical History:  Procedure Laterality Date  . ABDOMINAL HYSTERECTOMY    . CARDIAC CATHETERIZATION    . CATARACT EXTRACTION, BILATERAL    . EYE SURGERY    . INTRAOPERATIVE TRANSTHORACIC ECHOCARDIOGRAM Left 02/12/2021   Procedure: INTRAOPERATIVE TRANSTHORACIC ECHOCARDIOGRAM;  Surgeon: Sherren Mocha, MD;  Location: [place] [facility];  Service: Open Heart Surgery;  Laterality: Left;  . LUMBAR FUSION    . POSTERIOR TIBIAL TENDON REPAIR    .  RIGHT/LEFT HEART CATH AND CORONARY ANGIOGRAPHY N/A 01/29/2021   Procedure: RIGHT/LEFT HEART CATH AND CORONARY ANGIOGRAPHY;  Surgeon: Belva Crome, MD;  Location: [place] [facility];  Service: Cardiovascular;  Laterality: N/A;  . ROTATOR CUFF REPAIR    . TRANSCATHETER AORTIC VALVE REPLACEMENT, TRANSFEMORAL N/A 02/12/2021   Procedure: TRANSCATHETER AORTIC VALVE REPLACEMENT, TRANSFEMORAL;  Surgeon: Sherren Mocha, MD;  Location: [place];  Service: Open Heart Surgery;  Laterality: N/A;  . TUBAL LIGATION    . ULTRASOUND GUIDANCE FOR VASCULAR ACCESS Bilateral 02/12/2021   Procedure: ULTRASOUND GUIDANCE FOR VASCULAR ACCESS;  Surgeon: Sherren Mocha, MD;  Location: [place];  Service: Open Heart Surgery;  Laterality: Bilateral;    Current Medications: Current Meds  Medication Sig  . acetaminophen (TYLENOL) 500 MG tablet Take 1,000 mg by mouth See admin instructions. Take 1000 mg daily, may take a second 1000 mg dose as needed for pain  . amLODipine (NORVASC) 10 MG tablet Take 10 mg by mouth daily.  Marland Kitchen amoxicillin (AMOXIL) 500 MG tablet Take 4 tablets (2,000 mg total) one hour prior to all dental visits.  . Apple Cider Vinegar 500 MG TABS Take 500 mg by mouth daily.  Marland Kitchen aspirin 81 MG chewable tablet Chew 1 tablet (81 mg total) by mouth daily.  Marland Kitchen Bioflavonoid Products (ESTER C PO) Take 1,000 mg by mouth daily.  . Cholecalciferol (VITAMIN D) 50 MCG (2000 UT) tablet Take 2,000 Units by mouth daily.  . clopidogrel (PLAVIX) 75 MG tablet Take 1 tablet (75 mg total) by mouth daily with breakfast.  . Cyanocobalamin (B-12) 500 MCG SUBL Place 500 mcg under the tongue daily.  . DULoxetine (CYMBALTA) 60 MG capsule Take 60 mg by mouth daily.  . indapamide (LOZOL) 2.5 MG tablet Take 2.5 mg by mouth daily.  . Magnesium 100 MG CAPS Take 100 mg by mouth daily.  . meloxicam (MOBIC) 15 MG tablet Take 15 mg by mouth daily.  . metoprolol succinate (TOPROL XL) 25 MG 24 hr tablet Take 1 tablet (25 mg total) by mouth daily.  .  pantoprazole (PROTONIX) 40 MG tablet Take 1 tablet (40 mg total) by mouth daily.  . rosuvastatin (CRESTOR) 10 MG tablet Take 10 mg by mouth daily.  Marland Kitchen telmisartan (MICARDIS) 80 MG tablet Take 80 mg by mouth daily.  . Turmeric 500 MG CAPS Take 500 mg by mouth daily.     Allergies:   Dobutamine, Latex, and Sulfa antibiotics   Social History   Socioeconomic History  . Marital status: Married    Spouse name: Not on file  . Number of children: 1  . Years of education: Not on file  . Highest education level: Not on file  Occupational History  . Occupation: retired bookkeeper  Tobacco Use  . Smoking status: Former Smoker    Packs/day: 0.50    Years: 6.00    Pack years: 3.00    Quit date: 1976  Years since quitting: 46.4  . Smokeless tobacco: Never Used  Vaping Use  . Vaping Use: Never used  Substance and Sexual Activity  . Alcohol use: Never    Comment: occasional  . Drug use: Not Currently  . Sexual activity: Not on file  Other Topics Concern  . Not on file  Social History Narrative  . Not on file   Social Determinants of Health   Financial Resource Strain: Not on file  Food Insecurity: Not on file  Transportation Needs: Not on file  Physical Activity: Not on file  Stress: Not on file  Social Connections: Not on file     Family History: The patient's family history includes COPD in her mother; Cancer in her sister; Cirrhosis in her brother; Heart attack (age of onset: 20) in her father; Heart disease in her brother.  ROS:   Please see the history of present illness.    All other systems reviewed and are negative.  EKGs/Labs/Other Studies Reviewed:    The following studies were reviewed today:   TAVR OPERATIVE NOTE   Date of Procedure:02/12/21  Preoperative Diagnosis:Severe Aortic Stenosis   Postoperative Diagnosis:Same   Procedure:   Transcatheter Aortic Valve Replacement - Percutaneous Transfemoral  Approach Medtronic Evolut Pro-Plus (size 58mm, serial # [id number])  Co-Surgeons:Clarence H. Roxy Manns, MD and Sherren Mocha, MD  Anesthesiologist:Adam Marcie Bal, MD  Echocardiographer:Mihai Croitoru, MD  Pre-operative Echo Findings: ? Severe aortic stenosis ? Normalleft ventricular systolic function  Post-operative Echo Findings: ? Traceparavalvular leak ? Normal/unchangedleft ventricular systolic function  ____________________  Echo 02/13/21: IMPRESSIONS  1. Left ventricular ejection fraction, by estimation, is 60 to 65%. The  left ventricle has normal function. The left ventricle has no regional  wall motion abnormalities. There is mild concentric left ventricular  hypertrophy. Left ventricular diastolic  parameters are consistent with Grade I diastolic dysfunction (impaired  relaxation). Elevated left atrial pressure.  2. Right ventricular systolic function is normal. The right ventricular  size is normal. Tricuspid regurgitation signal is inadequate for assessing  PA pressure.  3. Left atrial size was moderately dilated.  4. The mitral valve is normal in structure. No evidence of mitral valve  regurgitation. Mild mitral stenosis. The mean mitral valve gradient is 5.8  mmHg with average heart rate of 75 bpm. Moderate to severe mitral annular  calcification.  5. The aortic valve has been repaired/replaced. Aortic valve  regurgitation is trivial. There is a 29 mm Medtronic stented (TAVR) valve  present in the aortic position. Procedure Date: 02/12/2021. Aortic valve  mean gradient measures 9.3 mmHg. Aortic valve  Vmax measures 2.11 m/s. Aortic valve acceleration time measures 77 msec.  6. The inferior vena cava is normal in size with greater than 50%  respiratory variability, suggesting right atrial pressure of 3 mmHg.   ________________________  Echo 03/13/21 IMPRESSIONS  1. Left  ventricular ejection fraction, by estimation, is >75%. The left  ventricle has hyperdynamic function. The left ventricle has no regional  wall motion abnormalities. There is severe left ventricular hypertrophy.  Left ventricular diastolic  parameters are consistent with Grade I diastolic dysfunction (impaired  relaxation). Elevated left atrial pressure.  2. Right ventricular systolic function is normal. The right ventricular  size is normal.  3. Left atrial size was moderately dilated.  4. The mitral valve is normal in structure. No evidence of mitral valve  regurgitation. No evidence of mitral stenosis. Severe mitral annular  calcification.  5. The aortic valve has been repaired/replaced. Aortic valve  regurgitation is  not visualized. No aortic stenosis is present. There is a  29 mm CoreValve-Evolut Pro prosthetic (TAVR) valve present in the aortic  position. Procedure Date: 02/12/21. Echo  findings are consistent with normal structure and function of the aortic  valve prosthesis. Aortic valve mean gradient measures 6.3 mmHg. Aortic  valve Vmax measures 1.83 m/s.  6. The inferior vena cava is normal in size with greater than 50%  respiratory variability, suggesting right atrial pressure of 3 mmHg.   EKG:  EKG is NOT ordered today.   Recent Labs: 01/10/2021: TSH 1.160 02/08/2021: ALT 20 02/13/2021: BUN 12; Creatinine, Ser 0.67; Hemoglobin 12.0; Magnesium 1.9; Platelets 201; Potassium 3.8; Sodium 137  Recent Lipid Panel    Component Value Date/Time   CHOL 163 01/10/2021 1050   TRIG 98 01/10/2021 1050   HDL 70 01/10/2021 1050   CHOLHDL 2.3 01/10/2021 1050   LDLCALC 75 01/10/2021 1050     Risk Assessment/Calculations:       Physical Exam:    VS:  BP 140/68   Pulse 83   Ht 5\' 2"  (1.575 m)   Wt 226 lb 6.4 oz (102.7 kg)   SpO2 97%   BMI 41.41 kg/m     Wt Readings from Last 3 Encounters:  03/13/21 226 lb 6.4 oz (102.7 kg)  02/20/21 223 lb 9.6 oz (101.4 kg)  02/13/21  227 lb 8.2 oz (103.2 kg)     GEN:  Well nourished, well developed in no acute distress, obese HEENT: Normal NECK: No JVD; LYMPHATICS: No lymphadenopathy CARDIAC: RRR, soft flow murmur @ RUSB. No, rubs, gallops RESPIRATORY:  Clear to auscultation without rales, wheezing or rhonchi  ABDOMEN: Soft, non-tender, non-distended MUSCULOSKELETAL:  No edema; No deformity  SKIN: Warm and dry. Groin sites healed  NEUROLOGIC:  Alert and oriented x 3 PSYCHIATRIC:  Normal affect   ASSESSMENT:    1. S/P TAVR (transcatheter aortic valve replacement)   2. Transient complete heart block (HCC)   3. Primary hypertension   4. Other fatigue    PLAN:    In order of problems listed above:  Severe AS s/p TAVR: echo today shows EF >75%, normally functioning TAVR with a mean gradient of 6 mm hg and no PVL. She has NYHA Yeargan II symptoms, mostly of dyspnea and fatigue.  SBE prophylaxis discussed; she has amoxicillin. Continue on aspirin and plavix. Stop plavix after 6 months of therapy (07/2021). I will see her back in 1 year with an echo.  Transient CHB: Zio patch did not show any recurrent HAVB, but did show several short runs of NSVT and SVT. She complains of palpitations and feeling like her hear is racing. Will start Toprol XL 25mg  daily   HTN: BP borderline elevated today. As above, start Toprol XL 25mg  daily for NSVT/SVT on monitor and subjective palpitations.   Fatigue: I think she has a component of deconditioning. I have told her to focus on exercise and weight loss. I also encouraged participation in cardiac rehab  Medication Adjustments/Labs and Tests Ordered: Current medicines are reviewed at length with the patient today.  Concerns regarding medicines are outlined above.  No orders of the defined types were placed in this encounter.  Meds ordered this encounter  Medications  . metoprolol succinate (TOPROL XL) 25 MG 24 hr tablet    Sig: Take 1 tablet (25 mg total) by mouth daily.     Dispense:  90 tablet    Refill:  3    Patient Instructions  Medication  Instructions:  1) START TOPROL (metoprolol succinate) 25 mg daily 2) STOP PLAVIX (clopidogrel) around 08/14/21 when your pills run out *If you need a refill on your cardiac medications before your next appointment, please call your pharmacy*  Follow-Up: Please keep your appointment as scheduled with Dr. Audie Box.  We will call you to arrange your 1 year TAVR visits.    Signed, Angelena Form, PA-C  03/14/2021 8:41 AM    [facility]

## 2021-03-13 NOTE — Patient Instructions (Signed)
Medication Instructions:  1) START TOPROL (metoprolol succinate) 25 mg daily 2) STOP PLAVIX (clopidogrel) around 08/14/21 when your pills run out *If you need a refill on your cardiac medications before your next appointment, please call your pharmacy*  Follow-Up: Please keep your appointment as scheduled with Dr. Audie Box.  We will call you to arrange your 1 year TAVR visits.

## 2021-03-22 ENCOUNTER — Telehealth (HOSPITAL_COMMUNITY): Payer: Self-pay

## 2021-03-22 ENCOUNTER — Encounter (HOSPITAL_COMMUNITY): Payer: Self-pay

## 2021-03-22 NOTE — Telephone Encounter (Signed)
Attempted to call patient in regards to Cardiac Rehab - LM on VM Mailed letter 

## 2021-04-11 NOTE — Telephone Encounter (Signed)
No response from pt.  Closed referral  

## 2021-06-10 ENCOUNTER — Telehealth (HOSPITAL_COMMUNITY): Payer: Self-pay

## 2021-06-10 NOTE — Telephone Encounter (Signed)
Pt called back wanting to schedule for cardiac rehab. Pt will come in for orientation on 07/18/2021'@1'$ :15pm and attend the 1:45pm exercise time.   Mailed packet.

## 2021-07-14 NOTE — Progress Notes (Signed)
Cardiology Office Note:   Date:  07/15/2021  NAME:  Katherine Waters    MRN: [id number] DOB:  27-Mar-1951   PCP:  Maude Leriche, PA-C  Cardiologist:  Evalina Field, MD  Electrophysiologist:  None   Referring MD: Maude Leriche, PA-C   Chief Complaint  Patient presents with   Follow-up   History of Present Illness:   Katherine Waters is a 70 y.o. female with a hx of severe AS s/p TAVR, COPD, Osa, obesity, HTN who presents for follow-up. Recent TAVR and doing well. Transient CHB but recovered after the procedure.   She reports she has not felt much better since her TAVR.  She is still getting short of breath with activity.  She is on a thiazide diuretic, indapamide.  Blood pressure 140/76.  Her weight is stable at 224 pounds.  BMI 41.  She reports that she has not been able to exercise much due to back pain.  She is also been diagnosed with inflammatory arthritis and is on certain medications.  She has taken herself off Crestor.  She was having cramping and noticed improvement.  I informed her that this is okay.  She had normal coronary arteries on cardiac cath.  She denies any chest pain symptoms.  She does have sleep apnea but does not wear the mask.  Reports she cannot tolerate it.  She has been on several rounds of prednisone.  Reports some improvement.  She will see her rheumatologist.  Back pain appears to be different.  She has a stop date of Plavix on 08/14/2021.  She understands she needs antibiotics before any dental work.  She and her husband are getting used to the [place] area.  They moved from [place].  They moved to be closer to her son and granddaughter.  Reports buying a house recently which was stressful.  Seems to be settling in nicely.  Denies any significant chest pain symptoms.  I did review her left heart catheterization.  She had elevated LVEDP at the time of heart cath.  I wonder if HFpEF could be contributing.  We will check a BNP to see if this is.  Regardless the treatment  will be weight loss.  She also has plans start cardiac rehab.  I think this is a great idea.  I believe this will help her immensely.  Problem List 1. Aortic stenosis s/p TAVR -normal coronaries 01/29/2021 -29 mm Evolut Pro 02/12/2021 2. HTN 3. HLD 4. OSA, mild 5. COPD, mild 6. Inflammatory arthritis  -on biologics   Past Medical History: Past Medical History:  Diagnosis Date   Arthritis    osteoarthritis   Asthma    Connective tissue disease ([place])    Depression    Fibromyalgia    GERD (gastroesophageal reflux disease)    Hyperlipidemia    Hypertension    PONV (postoperative nausea and vomiting)    Raynaud disease    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   s/p TAVR w/ a 29 mm Medtronic Evolut Pro + via the TF approach by Dr. Burt Knack and Dr. Roxy Manns    Severe aortic stenosis     Past Surgical History: Past Surgical History:  Procedure Laterality Date   ABDOMINAL HYSTERECTOMY     CARDIAC CATHETERIZATION     CATARACT EXTRACTION, BILATERAL     EYE SURGERY     INTRAOPERATIVE TRANSTHORACIC ECHOCARDIOGRAM Left 02/12/2021   Procedure: INTRAOPERATIVE TRANSTHORACIC ECHOCARDIOGRAM;  Surgeon: Sherren Mocha, MD;  Location: [facility];  Service: Open Heart  Surgery;  Laterality: Left;   LUMBAR FUSION     POSTERIOR TIBIAL TENDON REPAIR     RIGHT/LEFT HEART CATH AND CORONARY ANGIOGRAPHY N/A 01/29/2021   Procedure: RIGHT/LEFT HEART CATH AND CORONARY ANGIOGRAPHY;  Surgeon: Belva Crome, MD;  Location: [facility];  Service: Cardiovascular;  Laterality: N/A;   ROTATOR CUFF REPAIR     TRANSCATHETER AORTIC VALVE REPLACEMENT, TRANSFEMORAL N/A 02/12/2021   Procedure: TRANSCATHETER AORTIC VALVE REPLACEMENT, TRANSFEMORAL;  Surgeon: Sherren Mocha, MD;  Location: [facility];  Service: Open Heart Surgery;  Laterality: N/A;   TUBAL LIGATION     ULTRASOUND GUIDANCE FOR VASCULAR ACCESS Bilateral 02/12/2021   Procedure: ULTRASOUND GUIDANCE FOR VASCULAR ACCESS;  Surgeon: Sherren Mocha, MD;  Location: [facility];  Service: Open Heart Surgery;  Laterality: Bilateral;    Current Medications: Current Meds  Medication Sig   acetaminophen (TYLENOL) 500 MG tablet Take 1,000 mg by mouth See admin instructions. Take 1000 mg daily, may take a second 1000 mg dose as needed for pain   amLODipine (NORVASC) 10 MG tablet Take 10 mg by mouth daily.   amoxicillin (AMOXIL) 500 MG tablet Take 4 tablets (2,000 mg total) one hour prior to all dental visits.   APPLE CIDER VINEGAR PO Take 800 mg by mouth daily.   aspirin 81 MG chewable tablet Chew 1 tablet (81 mg total) by mouth daily.   Bioflavonoid Products (ESTER C PO) Take 1,000 mg by mouth daily.   Cholecalciferol (DIALYVITE VITAMIN D 5000) 125 MCG (5000 UT) capsule Take 5,000 Units by mouth daily.   clopidogrel (PLAVIX) 75 MG tablet Take 1 tablet (75 mg total) by mouth daily with breakfast.   Cyanocobalamin (B-12) 1000 MCG SUBL Place 1,000 mcg under the tongue daily.   DULoxetine (CYMBALTA) 60 MG capsule Take 60 mg by mouth daily.   indapamide (LOZOL) 2.5 MG tablet Take 2.5 mg by mouth daily.   leflunomide (ARAVA) 20 MG tablet Take 20 mg by mouth daily.   Magnesium Glycinate 100 MG CAPS Take 100 mg by mouth daily.   meloxicam (MOBIC) 15 MG tablet Take 15 mg by mouth daily.   pantoprazole (PROTONIX) 40 MG tablet Take 1 tablet (40 mg total) by mouth daily.   predniSONE (DELTASONE) 5 MG tablet Take 5 mg by mouth daily.   telmisartan (MICARDIS) 80 MG tablet Take 80 mg by mouth daily.   TURMERIC-GINGER PO Take 1 tablet by mouth daily.     Allergies:    Dobutamine, Latex, and Sulfa antibiotics   Social History: Social History   Socioeconomic History   Marital status: Married    Spouse name: Not on file   Number of children: 1   Years of education: Not on file   Highest education level: Not on file  Occupational History   Occupation: retired bookkeeper  Tobacco Use   Smoking status: Former    Packs/day: 0.50    Years: 6.00     Pack years: 3.00    Types: Cigarettes    Quit date: 1976    Years since quitting: 46.7   Smokeless tobacco: Never  Vaping Use   Vaping Use: Never used  Substance and Sexual Activity   Alcohol use: Never    Comment: occasional   Drug use: Not Currently   Sexual activity: Not on file  Other Topics Concern   Not on file  Social History Narrative   Not on file   Social Determinants of Health   Financial Resource Strain: Not on  file  Food Insecurity: Not on file  Transportation Needs: Not on file  Physical Activity: Not on file  Stress: Not on file  Social Connections: Not on file     Family History: The patient's family history includes COPD in her mother; Cancer in her sister; Cirrhosis in her brother; Heart attack (age of onset: 46) in her father; Heart disease in her brother.  ROS:   All other ROS reviewed and negative. Pertinent positives noted in the HPI.     EKGs/Labs/Other Studies Reviewed:   The following studies were personally reviewed by me today:   TTE 03/13/2021  1. Left ventricular ejection fraction, by estimation, is >75%. The left  ventricle has hyperdynamic function. The left ventricle has no regional  wall motion abnormalities. There is severe left ventricular hypertrophy.  Left ventricular diastolic  parameters are consistent with Grade I diastolic dysfunction (impaired  relaxation). Elevated left atrial pressure.   2. Right ventricular systolic function is normal. The right ventricular  size is normal.   3. Left atrial size was moderately dilated.   4. The mitral valve is normal in structure. No evidence of mitral valve  regurgitation. No evidence of mitral stenosis. Severe mitral annular  calcification.   5. The aortic valve has been repaired/replaced. Aortic valve  regurgitation is not visualized. No aortic stenosis is present. There is a  29 mm CoreValve-Evolut Pro prosthetic (TAVR) valve present in the aortic  position. Procedure Date:  02/12/21. Echo  findings are consistent with normal structure and function of the aortic  valve prosthesis. Aortic valve mean gradient measures 6.3 mmHg. Aortic  valve Vmax measures 1.83 m/s.   6. The inferior vena cava is normal in size with greater than 50%  respiratory variability, suggesting right atrial pressure of 3 mmHg.   Recent Labs: 01/10/2021: TSH 1.160 02/08/2021: ALT 20 02/13/2021: BUN 12; Creatinine, Ser 0.67; Hemoglobin 12.0; Magnesium 1.9; Platelets 201; Potassium 3.8; Sodium 137   Recent Lipid Panel    Component Value Date/Time   CHOL 163 01/10/2021 1050   TRIG 98 01/10/2021 1050   HDL 70 01/10/2021 1050   CHOLHDL 2.3 01/10/2021 1050   LDLCALC 75 01/10/2021 1050    Physical Exam:   VS:  BP 140/76   Pulse 96   Ht 5\' 2"  (1.575 m)   Wt 224 lb 6.4 oz (101.8 kg)   SpO2 97%   BMI 41.04 kg/m    Wt Readings from Last 3 Encounters:  07/15/21 224 lb 6.4 oz (101.8 kg)  03/13/21 226 lb 6.4 oz (102.7 kg)  02/20/21 223 lb 9.6 oz (101.4 kg)    General: Well nourished, well developed, in no acute distress Head: Atraumatic, normal size  Eyes: PEERLA, EOMI  Neck: Supple, no JVD Endocrine: No thryomegaly Cardiac: Normal S1, S2; RRR; no murmurs, rubs, or gallops Lungs: Clear to auscultation bilaterally, no wheezing, rhonchi or rales  Abd: Soft, nontender, no hepatomegaly  Ext: No edema, pulses 2+ Musculoskeletal: No deformities, BUE and BLE strength normal and equal Skin: Warm and dry, no rashes   Neuro: Alert and oriented to person, place, time, and situation, CNII-XII grossly intact, no focal deficits  Psych: Normal mood and affect   ASSESSMENT:   Maydell Knoebel is a 70 y.o. female who presents for the following: 1. SOB (shortness of breath)   2. Obesity, morbid, BMI 40.0-49.9 ([place])   3. S/P TAVR (transcatheter aortic valve replacement)   4. Severe aortic stenosis   5. Primary hypertension  PLAN:   1. SOB (shortness of breath) 2. Obesity, morbid, BMI 40.0-49.9  ([place]) -She continues to report shortness of breath with exertion.  Cardiovascular examination is normal.  Lungs are clear.  There is no evidence of congestive heart failure on today's examination.  -She did have elevated wedge pressure as well as LVEDP on heart cath.  I believe she likely has an element of HFpEF.  She will continue on her indapamide as this is a diuretic.  I have encouraged her to remain active.  We will check a BNP today to see if she needs to be on a stronger diuretic.  I really believe weight loss will help her.  Her BMI is 41.  She has plans start cardiac rehab.  For now we will continue with her indapamide.  We may adjust diuretics if her BNP is elevated. -Her hemodynamics could also been worsened in the setting of severe AS.  I would expect this to have been improved.  Again I think her weight is contributing a lot.  She reports that she is lost 60 pounds in the past and felt immensely better.  I have encouraged her to do the same.  3. S/P TAVR (transcatheter aortic valve replacement) 4. Severe aortic stenosis -Status post 29 mm evolute pro on 02/12/2021.  Most recent echo shows normal function of the valve.  She will continue aspirin and Plavix through 08/14/2021.  After that date she will start aspirin monotherapy indefinitely.  She will continue with antibiotics for any dental work.  Overall everything seems to be functioning well.  5. Primary hypertension -140/76.  Continue current medications.  Disposition: Return in about 6 months (around 01/12/2022).  Medication Adjustments/Labs and Tests Ordered: Current medicines are reviewed at length with the patient today.  Concerns regarding medicines are outlined above.  Orders Placed This Encounter  Procedures   Brain natriuretic peptide    No orders of the defined types were placed in this encounter.   Patient Instructions  Medication Instructions:  STOP PLAVIX (CLOPIDOGREL) ON October 26th 2022 PLEASE CONTINUE ASPIRIN  81MG  INDEFINITELY  *If you need a refill on your cardiac medications before your next appointment, please call your pharmacy*  Lab Work: BNP- TODAY  If you have labs (blood work) drawn today and your tests are completely normal, you will receive your results only by: [place] (if you have MyChart) OR A paper copy in the mail If you have any lab test that is abnormal or we need to change your treatment, we will call you to review the results.  Follow-Up: At [facility], you and your health needs are our priority.  As part of our continuing mission to provide you with exceptional heart care, we have created designated Provider Care Teams.  These Care Teams include your primary Cardiologist (physician) and Advanced Practice Providers (APPs -  Physician Assistants and Nurse Practitioners) who all work together to provide you with the care you need, when you need it.  Your next appointment:   6 month(s)  The format for your next appointment:   In Person  Provider:   Eleonore Chiquito, MD   Time Spent with Patient: I have spent a total of 35 minutes with patient reviewing hospital notes, telemetry, EKGs, labs and examining the patient as well as establishing an assessment and plan that was discussed with the patient.  > 50% of time was spent in direct patient care.  Signed, Addison Naegeli. Audie Box, MD, [facility]  [address], [place], [place] [address] [phone]  07/15/2021 10:43 AM

## 2021-07-15 ENCOUNTER — Ambulatory Visit (INDEPENDENT_AMBULATORY_CARE_PROVIDER_SITE_OTHER): Payer: Medicare Other | Admitting: Cardiovascular Disease

## 2021-07-15 ENCOUNTER — Encounter: Payer: Self-pay | Admitting: Cardiovascular Disease

## 2021-07-15 ENCOUNTER — Other Ambulatory Visit: Payer: Self-pay

## 2021-07-15 VITALS — BP 140/76 | HR 96 | Ht 62.0 in | Wt 224.4 lb

## 2021-07-15 DIAGNOSIS — I350 Nonrheumatic aortic (valve) stenosis: Secondary | ICD-10-CM

## 2021-07-15 DIAGNOSIS — Z952 Presence of prosthetic heart valve: Secondary | ICD-10-CM | POA: Diagnosis not present

## 2021-07-15 DIAGNOSIS — I10 Essential (primary) hypertension: Secondary | ICD-10-CM

## 2021-07-15 DIAGNOSIS — R0602 Shortness of breath: Secondary | ICD-10-CM

## 2021-07-15 DIAGNOSIS — E669 Obesity, unspecified: Secondary | ICD-10-CM

## 2021-07-15 NOTE — Patient Instructions (Addendum)
Medication Instructions:  STOP PLAVIX (CLOPIDOGREL) ON October 26th 2022 PLEASE CONTINUE ASPIRIN 81MG  INDEFINITELY  *If you need a refill on your cardiac medications before your next appointment, please call your pharmacy*  Lab Work: BNP- TODAY  If you have labs (blood work) drawn today and your tests are completely normal, you will receive your results only by: [place] (if you have MyChart) OR A paper copy in the mail If you have any lab test that is abnormal or we need to change your treatment, we will call you to review the results.  Follow-Up: At [facility], you and your health needs are our priority.  As part of our continuing mission to provide you with exceptional heart care, we have created designated Provider Care Teams.  These Care Teams include your primary Cardiologist (physician) and Advanced Practice Providers (APPs -  Physician Assistants and Nurse Practitioners) who all work together to provide you with the care you need, when you need it.  Your next appointment:   6 month(s)  The format for your next appointment:   In Person  Provider:   Eleonore Chiquito, MD

## 2021-07-16 ENCOUNTER — Other Ambulatory Visit: Payer: Self-pay | Admitting: Physician Assistant

## 2021-07-16 ENCOUNTER — Telehealth (HOSPITAL_COMMUNITY): Payer: Self-pay

## 2021-07-16 ENCOUNTER — Ambulatory Visit
Admission: RE | Admit: 2021-07-16 | Discharge: 2021-07-16 | Disposition: A | Discharge status: Home / Self Care | Payer: Medicare Other | Source: Ambulatory Visit | Attending: Physician Assistant | Admitting: Physician Assistant

## 2021-07-16 DIAGNOSIS — M5441 Lumbago with sciatica, right side: Secondary | ICD-10-CM

## 2021-07-16 LAB — BRAIN NATRIURETIC PEPTIDE: BNP: 60.2 pg/mL (ref 0.0–100.0)

## 2021-07-17 ENCOUNTER — Telehealth (HOSPITAL_COMMUNITY): Payer: Self-pay

## 2021-07-18 ENCOUNTER — Inpatient Hospital Stay (HOSPITAL_COMMUNITY): Admission: RE | Admit: 2021-07-18 | Payer: Medicare Other | Source: Ambulatory Visit

## 2021-07-22 ENCOUNTER — Ambulatory Visit (HOSPITAL_COMMUNITY): Payer: Medicare Other

## 2021-07-24 ENCOUNTER — Ambulatory Visit (HOSPITAL_COMMUNITY): Payer: Medicare Other

## 2021-07-26 ENCOUNTER — Ambulatory Visit (HOSPITAL_COMMUNITY): Payer: Medicare Other

## 2021-07-29 ENCOUNTER — Ambulatory Visit (HOSPITAL_COMMUNITY): Payer: Medicare Other

## 2021-07-31 ENCOUNTER — Ambulatory Visit (HOSPITAL_COMMUNITY): Payer: Medicare Other

## 2021-08-02 ENCOUNTER — Ambulatory Visit (HOSPITAL_COMMUNITY): Payer: Medicare Other

## 2021-08-05 ENCOUNTER — Ambulatory Visit (HOSPITAL_COMMUNITY): Payer: Medicare Other

## 2021-08-06 ENCOUNTER — Other Ambulatory Visit: Payer: Self-pay | Admitting: Rehabilitation

## 2021-08-06 DIAGNOSIS — M4326 Fusion of spine, lumbar region: Secondary | ICD-10-CM

## 2021-08-07 ENCOUNTER — Ambulatory Visit (HOSPITAL_COMMUNITY): Payer: Medicare Other

## 2021-08-07 ENCOUNTER — Other Ambulatory Visit: Payer: Self-pay | Admitting: Physician Assistant

## 2021-08-09 ENCOUNTER — Ambulatory Visit (HOSPITAL_COMMUNITY): Payer: Medicare Other

## 2021-08-12 ENCOUNTER — Ambulatory Visit (HOSPITAL_COMMUNITY): Payer: Medicare Other

## 2021-08-12 IMAGING — CT CT ANGIO CHEST
1 of 4 series · 16 of 29 positions shown · IV contrast (omnipaque)
Comparison: No priors.

CLINICAL DATA: 69-year-old female with history of severe aortic
stenosis. Preprocedural study prior to potential transcatheter
aortic valve replacement (TAVR) procedure.

EXAM:
CT ANGIOGRAPHY CHEST, ABDOMEN AND PELVIS
TECHNIQUE: Multidetector CT imaging through the chest, abdomen and pelvis was
performed using the standard protocol during bolus administration of
intravenous contrast. Multiplanar reconstructed images and MIPs were
obtained and reviewed to evaluate the vascular anatomy.
CONTRAST:  100mL OMNIPAQUE IOHEXOL 350 MG/ML SOLN

[Series 1150: findings · 0.44mm/px · 16 of 50 slices shown]
[im 3/50  lung]
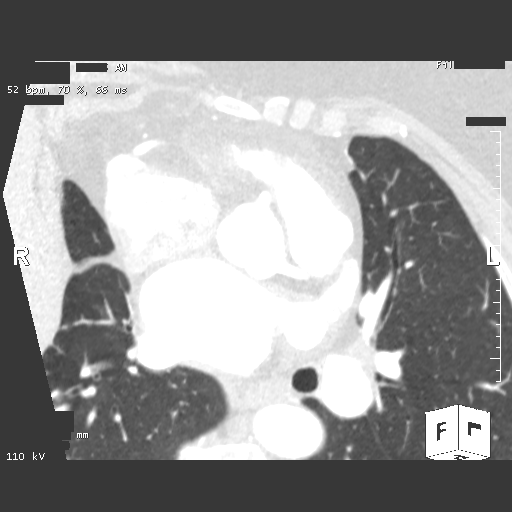
[im 6/50  mediastinal]
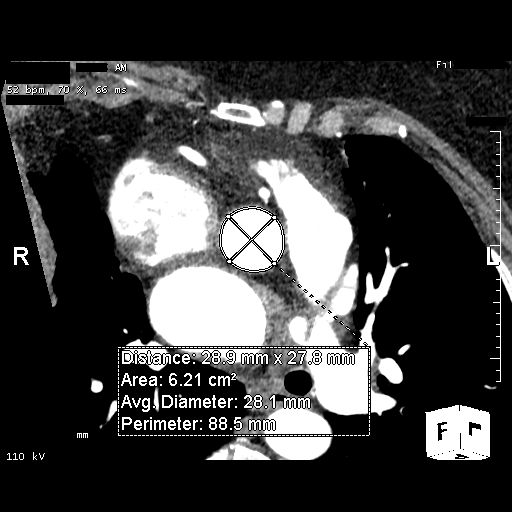
[im 9/50  lung]
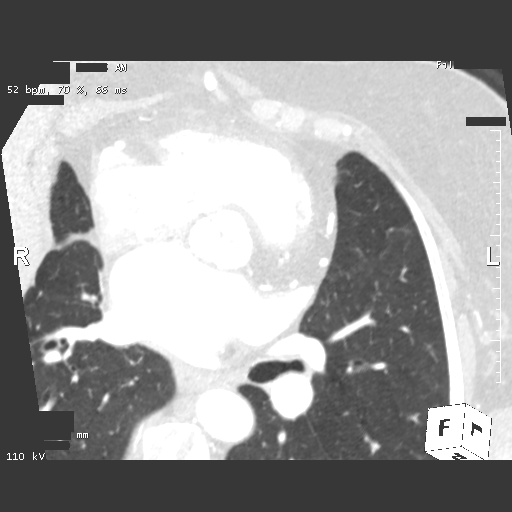
[im 12/50  mediastinal]
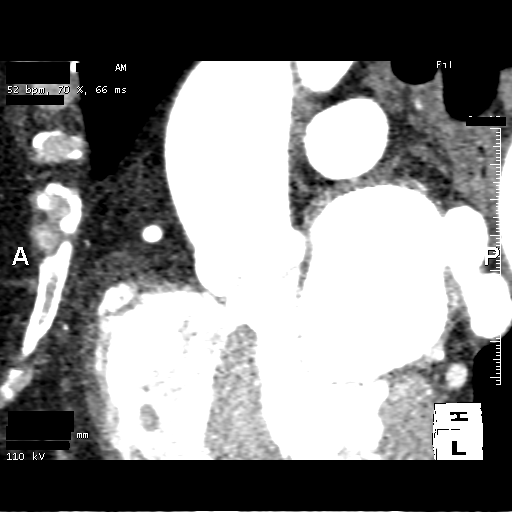
[im 15/50  lung]
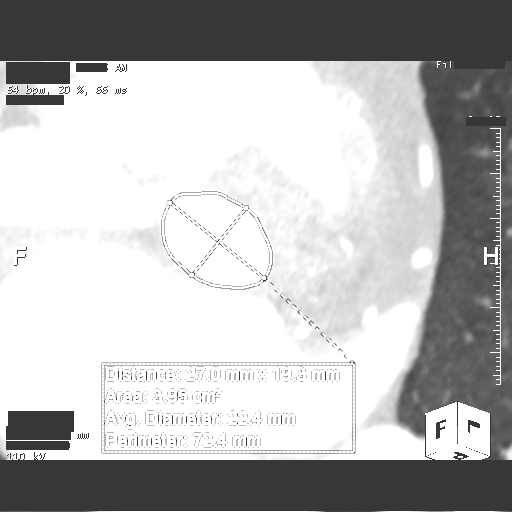
[im 18/50  mediastinal]
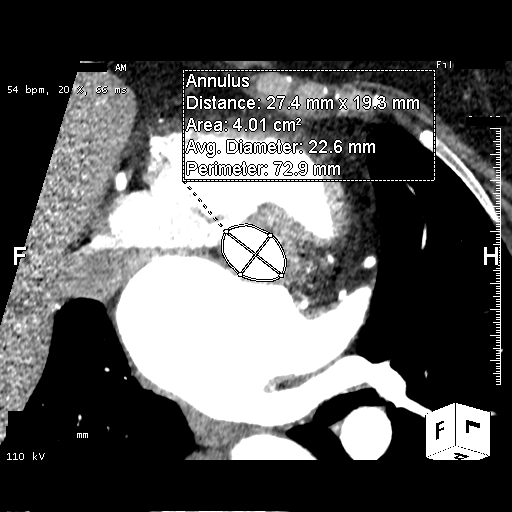
[im 21/50  lung]
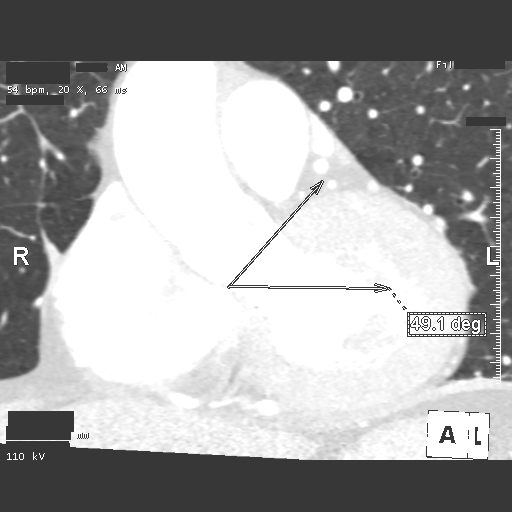
[im 24/50  mediastinal]
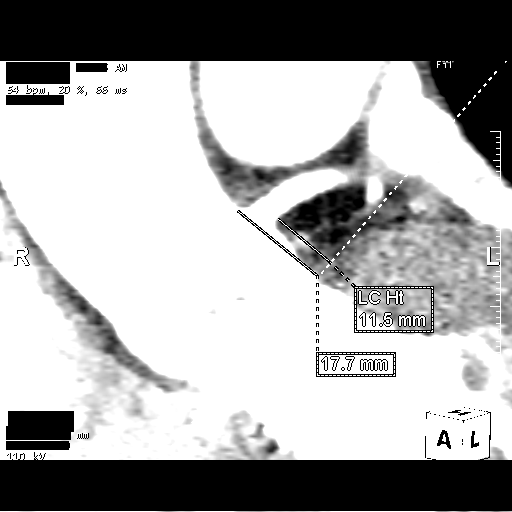
[im 25/50  lung]
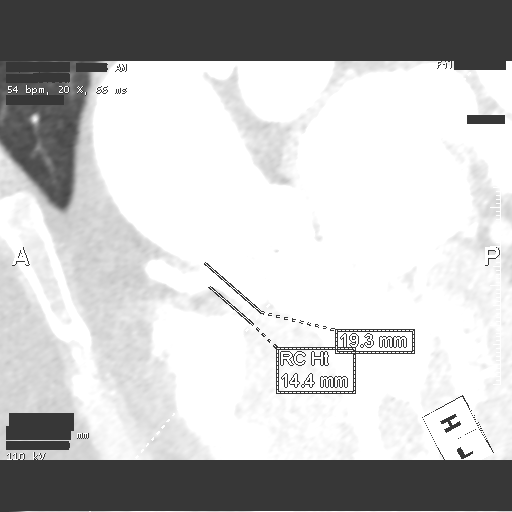
[im 26/50  mediastinal]
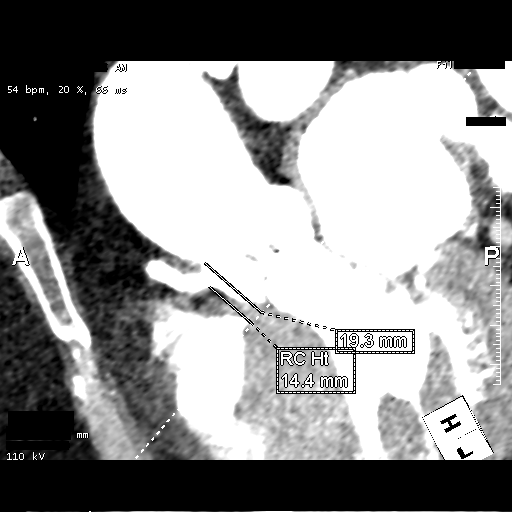
[im 32/50  lung]
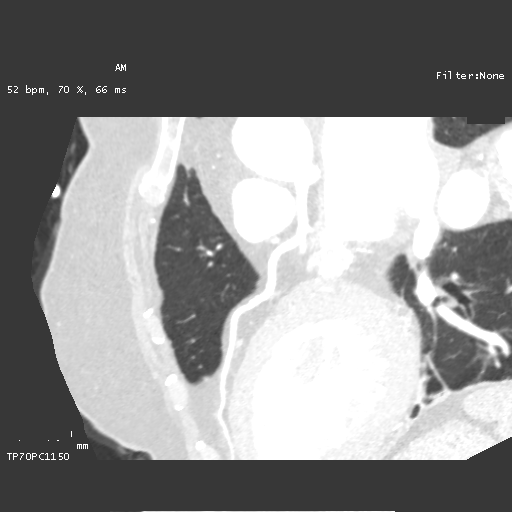
[im 35/50  mediastinal]
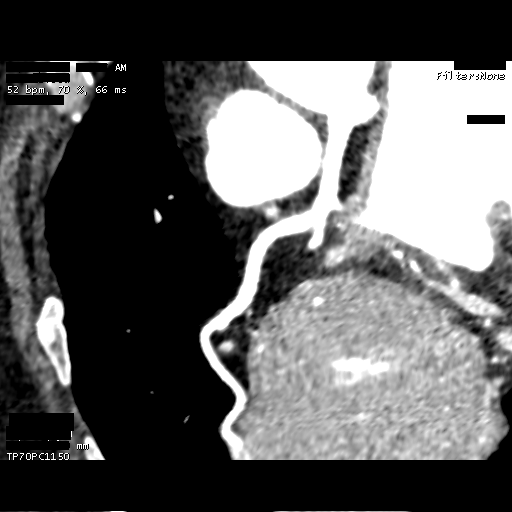
[im 38/50  lung]
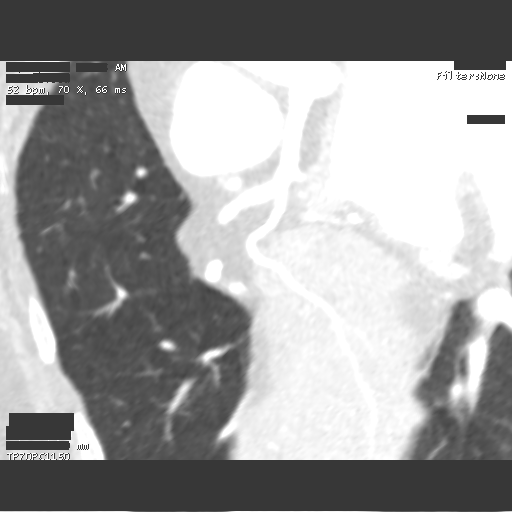
[im 41/50  mediastinal]
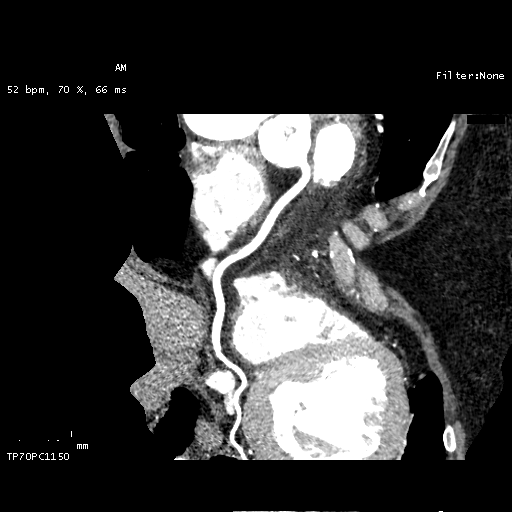
[im 44/50  lung]
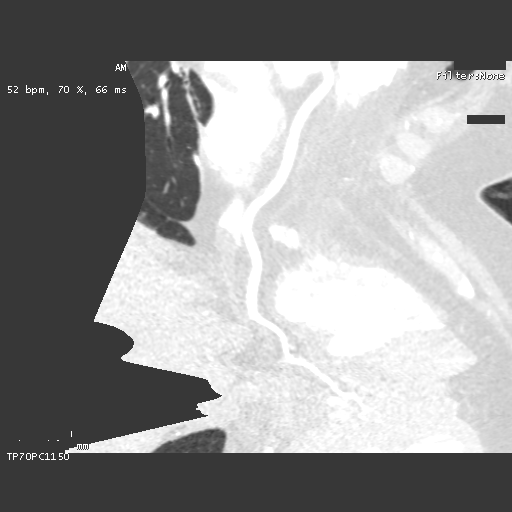
[im 47/50  mediastinal]
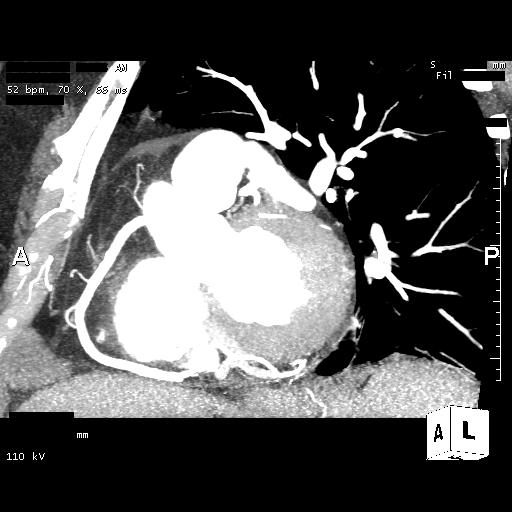

[16 of 29 positions shown; findings below may reference images not displayed]

FINDINGS: CTA CHEST FINDINGS

Cardiovascular: Heart size is mildly enlarged. There is no
significant pericardial fluid, thickening or pericardial
calcification. Aortic atherosclerosis. No definite coronary artery
calcifications. Severe thickening and calcification of the aortic
valve. Severe calcifications of the mitral annulus.

Mediastinum/Lymph Nodes: No pathologically enlarged mediastinal or
hilar lymph nodes. Esophagus is unremarkable in appearance.

Lungs/Pleura: Right-sided Bochdalek's hernia. Scattered areas of
mild linear scarring are noted in the lung bases bilaterally. No
acute consolidative airspace disease. No pleural effusions. No
suspicious appearing pulmonary nodules or masses are noted.

Musculoskeletal/Soft Tissues: There are no aggressive appearing
lytic or blastic lesions noted in the visualized portions of the
skeleton.

CTA ABDOMEN AND PELVIS FINDINGS

Hepatobiliary: No suspicious cystic or solid hepatic lesions. No
intra or extrahepatic biliary ductal dilatation. Gallbladder is
normal in appearance.

Pancreas: No pancreatic mass. No pancreatic ductal dilatation. No
pancreatic or peripancreatic fluid collections or inflammatory
changes.

Spleen: Unremarkable.

Adrenals/Urinary Tract: Horseshoe kidney (normal anatomical
variant). No suspicious renal lesions. No hydroureteronephrosis.
Bilateral adrenal glands are normal in appearance. Urinary bladder
is nearly decompressed, but otherwise unremarkable in appearance.

Stomach/Bowel: The appearance of the stomach is normal. No
pathologic dilatation of small bowel or colon. A few scattered
colonic diverticulae are noted, without surrounding inflammatory
changes to suggest an acute diverticulitis at this time. The
appendix is not confidently identified and may be surgically absent.
Regardless, there are no inflammatory changes noted adjacent to the
cecum to suggest the presence of an acute appendicitis at this time.

Vascular/Lymphatic: Aortic atherosclerosis, without evidence of
aneurysm or dissection noted in the abdominal or pelvic vasculature.
Vascular findings and measurements pertinent to potential TAVR
procedure, as detailed the low. No lymphadenopathy noted in the
abdomen or pelvis.

Reproductive: Status post hysterectomy. Right ovary is not
confidently identified may be surgically absent or atrophic.
Well-defined low-attenuation left adnexal lesion measuring 3.2 x
x 3.2 cm, likely a cyst.

Other: No significant volume of ascites.  No pneumoperitoneum.

Musculoskeletal: Status post PLIF at L4-L5. There are no aggressive
appearing lytic or blastic lesions noted in the visualized portions
of the skeleton.

VASCULAR MEASUREMENTS PERTINENT TO TAVR:

AORTA:

Minimal Aortic 4iameter-PT x 14 mm

Severity of Aortic Calcification-moderate

RIGHT PELVIS:

Right Common Iliac Artery -

Minimal Uiameter-A.L x 9.3 mm

Tortuosity-mild

Calcification-mild

Right External Iliac Artery -

Minimal Siameter-4.4 x 8.7 mm

Tortuosity-severe

Calcification-none

Right Common Femoral Artery -

Minimal Riameter-R.5 x 8.3 mm

Tortuosity-mild

Calcification-mild

LEFT PELVIS:

Left Common Iliac Artery -

Minimal 0iameter-RI.M x 8.8 mm

Tortuosity-mild

Calcification-mild

Left External Iliac Artery -

Minimal Uiameter-4.8 x 8.8 mm

Tortuosity-severe

Calcification-none

Left Common Femoral Artery -

Minimal 8iameter-1.5 x 9.0 mm

Tortuosity-mild

Calcification-none

Review of the MIP images confirms the above findings.
IMPRESSION: 1. Vascular findings and measurements pertinent to potential TAVR
procedure, as detailed above.
2. Severe thickening calcification of the aortic valve, compatible
with reported clinical history of severe aortic stenosis.
3. Horseshoe kidney, normal anatomical variant, incidentally noted.
4. Colonic diverticulosis without evidence of acute diverticulitis
at this time.
5. Additional incidental findings, as above.

## 2021-08-14 ENCOUNTER — Ambulatory Visit (HOSPITAL_COMMUNITY): Payer: Medicare Other

## 2021-08-16 ENCOUNTER — Ambulatory Visit (HOSPITAL_COMMUNITY): Payer: Medicare Other

## 2021-08-16 ENCOUNTER — Other Ambulatory Visit: Payer: Self-pay

## 2021-08-16 ENCOUNTER — Ambulatory Visit
Admission: RE | Admit: 2021-08-16 | Discharge: 2021-08-16 | Disposition: A | Discharge status: Home / Self Care | Payer: Medicare Other | Source: Ambulatory Visit | Attending: Rehabilitation | Admitting: Rehabilitation

## 2021-08-16 DIAGNOSIS — M4326 Fusion of spine, lumbar region: Secondary | ICD-10-CM

## 2021-08-16 MED ORDER — GADOBENATE DIMEGLUMINE 529 MG/ML IV SOLN
20.0000 mL | Freq: Once | INTRAVENOUS | Status: AC | PRN
  Administered 2021-08-16: 20 mL via INTRAVENOUS

## 2021-08-19 ENCOUNTER — Ambulatory Visit (HOSPITAL_COMMUNITY): Payer: Medicare Other

## 2021-08-21 ENCOUNTER — Ambulatory Visit (HOSPITAL_COMMUNITY): Payer: Medicare Other

## 2021-08-23 ENCOUNTER — Ambulatory Visit (HOSPITAL_COMMUNITY): Payer: Medicare Other

## 2021-08-26 ENCOUNTER — Ambulatory Visit (HOSPITAL_COMMUNITY): Payer: Medicare Other

## 2021-08-27 ENCOUNTER — Other Ambulatory Visit: Payer: Self-pay | Admitting: Physician Assistant

## 2021-08-27 DIAGNOSIS — N949 Unspecified condition associated with female genital organs and menstrual cycle: Secondary | ICD-10-CM

## 2021-08-28 ENCOUNTER — Ambulatory Visit (HOSPITAL_COMMUNITY): Payer: Medicare Other

## 2021-08-30 ENCOUNTER — Ambulatory Visit (HOSPITAL_COMMUNITY): Payer: Medicare Other

## 2021-09-02 ENCOUNTER — Ambulatory Visit (HOSPITAL_COMMUNITY): Payer: Medicare Other

## 2021-09-04 ENCOUNTER — Ambulatory Visit (HOSPITAL_COMMUNITY): Payer: Medicare Other

## 2021-09-04 ENCOUNTER — Telehealth (HOSPITAL_COMMUNITY): Payer: Self-pay

## 2021-09-04 NOTE — Telephone Encounter (Signed)
Called and spoke with pt in regards to CR, pt stated she is not ready to schedule right now due to continue back issues.   Closed referral

## 2021-09-05 ENCOUNTER — Ambulatory Visit
Admission: RE | Admit: 2021-09-05 | Discharge: 2021-09-05 | Disposition: A | Discharge status: Home / Self Care | Payer: Medicare Other | Source: Ambulatory Visit | Attending: Physician Assistant | Admitting: Physician Assistant

## 2021-09-05 DIAGNOSIS — N949 Unspecified condition associated with female genital organs and menstrual cycle: Secondary | ICD-10-CM

## 2021-09-06 ENCOUNTER — Ambulatory Visit (HOSPITAL_COMMUNITY): Payer: Medicare Other

## 2021-09-09 ENCOUNTER — Ambulatory Visit (HOSPITAL_COMMUNITY): Payer: Medicare Other

## 2021-09-11 ENCOUNTER — Ambulatory Visit (HOSPITAL_COMMUNITY): Payer: Medicare Other

## 2021-10-22 ENCOUNTER — Telehealth (HOSPITAL_COMMUNITY): Payer: Self-pay | Admitting: Physician Assistant

## 2021-11-07 ENCOUNTER — Telehealth (HOSPITAL_COMMUNITY): Payer: Self-pay

## 2021-11-07 NOTE — Telephone Encounter (Signed)
Called patient to see if she was interested in participating in the Cardiac Rehab Program. Patient stated yes. Patient will come in for orientation on 11/21/21 @ 1030AM and will attend the 1030AM exercise [place].   Tourist information centre manager.

## 2021-11-20 ENCOUNTER — Telehealth (HOSPITAL_COMMUNITY): Payer: Self-pay | Admitting: *Deleted

## 2021-11-21 ENCOUNTER — Other Ambulatory Visit: Payer: Self-pay

## 2021-11-21 ENCOUNTER — Encounter (HOSPITAL_COMMUNITY)
Admission: RE | Admit: 2021-11-21 | Discharge: 2021-11-21 | Disposition: A | Discharge status: Home / Self Care | Payer: Medicare Other | Source: Ambulatory Visit | Attending: Cardiovascular Disease | Admitting: Cardiovascular Disease

## 2021-11-21 ENCOUNTER — Encounter (HOSPITAL_COMMUNITY): Payer: Self-pay

## 2021-11-21 ENCOUNTER — Encounter: Payer: Self-pay | Admitting: Student

## 2021-11-21 VITALS — BP 134/76 | HR 75 | Ht 62.5 in | Wt 239.9 lb

## 2021-11-21 DIAGNOSIS — Z952 Presence of prosthetic heart valve: Secondary | ICD-10-CM | POA: Insufficient documentation

## 2021-11-21 NOTE — Progress Notes (Signed)
Cardiac Individual Treatment Plan  Patient Details  Name: Katherine Waters MRN: [id number] Date of Birth: 1950-12-18 Referring Provider:   Flowsheet Row CARDIAC REHAB PHASE II ORIENTATION from 11/21/2021 in [place]  Referring Provider Eleonore Chiquito, MD       Initial Encounter Date:  Flowsheet Row CARDIAC REHAB PHASE II ORIENTATION from 11/21/2021 in [place]  Date 11/21/21       Visit Diagnosis: 02/12/21 S/P TAVR   Patient's Home Medications on Admission:  Current Outpatient Medications:    acetaminophen (TYLENOL) 500 MG tablet, Take 1,000 mg by mouth See admin instructions. Take 2 tablets (1000 mg) by mouth scheduled in the morning daily, may take an additional dose (1000 mg) as needed for pain, Disp: , Rfl:    amLODipine (NORVASC) 10 MG tablet, Take 10 mg by mouth in the morning., Disp: , Rfl:    amoxicillin (AMOXIL) 500 MG tablet, Take 4 tablets (2,000 mg total) one hour prior to all dental visits., Disp: 8 tablet, Rfl: 12   APPLE CIDER VINEGAR PO, Take 1,600 mg by mouth daily., Disp: , Rfl:    aspirin 81 MG chewable tablet, Chew 1 tablet (81 mg total) by mouth daily., Disp: , Rfl:    Bioflavonoid Products (ESTER C PO), Take 1,000 mg by mouth in the morning., Disp: , Rfl:    Cholecalciferol (DIALYVITE VITAMIN D 5000) 125 MCG (5000 UT) capsule, Take 5,000 Units by mouth in the morning., Disp: , Rfl:    Cyanocobalamin (B-12) 1000 MCG SUBL, Place 1,000 mcg under the tongue daily., Disp: , Rfl:    DULoxetine (CYMBALTA) 60 MG capsule, Take 60 mg by mouth in the morning., Disp: , Rfl:    indapamide (LOZOL) 2.5 MG tablet, Take 2.5 mg by mouth in the morning., Disp: , Rfl:    MAGNESIUM GLYCINATE PO, Take 200 mg by mouth in the morning., Disp: , Rfl:    meloxicam (MOBIC) 15 MG tablet, Take 15 mg by mouth daily., Disp: , Rfl:    Multiple Vitamins-Minerals (HAIR/SKIN/NAILS/BIOTIN) TABS, Take 1 tablet by mouth in the morning., Disp:  , Rfl:    pantoprazole (PROTONIX) 40 MG tablet, TAKE 1 TABLET BY MOUTH EVERY DAY, Disp: 90 tablet, Rfl: 1   telmisartan (MICARDIS) 80 MG tablet, Take 80 mg by mouth in the morning., Disp: , Rfl:    TURMERIC-GINGER PO, Take 1 tablet by mouth daily., Disp: , Rfl:    metoprolol succinate (TOPROL XL) 25 MG 24 hr tablet, Take 1 tablet (25 mg total) by mouth daily. 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 Burt Knack and Dr. Roxy Manns    Severe aortic stenosis     Tobacco Use: Social History   Tobacco Use  Smoking Status Former   Packs/day: 0.50   Years: 6.00   Pack years: 3.00   Types: Cigarettes   Quit date: 1976   Years since quitting: 47.1  Smokeless Tobacco  Never    Labs: Recent Review Flowsheet Data     Labs for ITP Cardiac and Pulmonary Rehab Latest Ref Rng & Units 02/12/2021 02/12/2021 02/12/2021 02/12/2021 02/12/2021   Cholestrol 100 - 199 mg/dL - - - - -   LDLCALC 0 - 99 mg/dL - - - - -   HDL >39 mg/dL - - - - -   Trlycerides 0 - 149 mg/dL - - - - -   PHART 7.350 - 7.450 - 7.379 - - -   PCO2ART 32.0 - 48.0 mmHg - 46.0 - - -   HCO3 20.0 - 28.0 mmol/L - 27.1 - - -   TCO2 22 - 32 mmol/L 27 29 27 26 24    O2SAT % - 100.0 - - -       Capillary Blood Glucose: No results found for: GLUCAP   Exercise Target Goals: Exercise Program Goal: Individual exercise prescription set using results from initial 6 min walk test and THRR while considering  patients activity barriers and safety.   Exercise Prescription  Goal: Starting with aerobic activity 30 plus minutes a day, 3 days per week for initial exercise prescription. Provide home exercise prescription and guidelines that participant acknowledges understanding prior to discharge.  Activity Barriers & Risk Stratification:  Activity Barriers & Cardiac Risk Stratification - 11/21/21 1540       Activity Barriers & Cardiac Risk Stratification   Activity Barriers Arthritis;Back Problems;Neck/Spine Problems;Joint Problems;Deconditioning;Muscular Weakness;Shortness of Breath;Balance Concerns    Cardiac Risk Stratification High             6 Minute Walk:  6 Minute Walk     Row Name 11/21/21 1158         6 Minute Walk   Phase Initial     Distance 894 feet     Walk Time 6 minutes     # of Rest Breaks 0     MPH 1.69     METS 1.62     RPE 11     Perceived Dyspnea  2     VO2 Peak 5.66     Symptoms Yes (comment)     Comments SOB, RPD = 2     Resting HR 87 bpm     Resting BP 134/76     Resting Oxygen Saturation  97 %     Exercise Oxygen Saturation  during 6 min walk 92 %     Max Ex. HR 109 bpm     Max Ex. BP 164/80     2 Minute Post BP 146/78       Interval HR   1 Minute HR --  No data     2 Minute HR --  No data     3 Minute HR 104     4 Minute HR 105     5 Minute HR 103     6 Minute HR 108     Interval Heart Rate? Yes       Interval Oxygen   Interval Oxygen? Yes     Baseline Oxygen Saturation % 97 %     1 Minute Oxygen Saturation % --  No value     1 Minute Liters of Oxygen 0 L     2 Minute Oxygen Saturation % --  No value     2 Minute Liters of Oxygen 0 L     3 Minute Oxygen Saturation % 91 %     3 Minute Liters of Oxygen 0 L  4 Minute Oxygen Saturation % 91 %     4 Minute Liters of Oxygen 0 L     5 Minute Oxygen Saturation % 91 %     5 Minute Liters of Oxygen 0 L     6 Minute Oxygen Saturation % 92 %     6 Minute Liters of Oxygen 0 L     2 Minute Post Oxygen Saturation % 98 %     2 Minute Post Liters of Oxygen 0  L              Oxygen Initial Assessment:   Oxygen Re-Evaluation:   Oxygen Discharge (Final Oxygen Re-Evaluation):   Initial Exercise Prescription:  Initial Exercise Prescription - 11/21/21 1500       Date of Initial Exercise RX and Referring Provider   Date 11/21/21    Referring Provider Eleonore Chiquito, MD    Expected Discharge Date 01/17/22      T5 Nustep   Level 1    SPM 70    Minutes 25    METs 1.6      Prescription Details   Frequency (times per week) 3    Duration Progress to 30 minutes of continuous aerobic without signs/symptoms of physical distress      Intensity   THRR 40-80% of Max Heartrate 60-119    Ratings of Perceived Exertion 11-13    Perceived Dyspnea 0-4      Progression   Progression Continue progressive overload as per policy without signs/symptoms or physical distress.      Resistance Training   Training Prescription Yes    Weight 2 lbs    Reps 10-15             Perform Capillary Blood Glucose checks as needed.  Exercise Prescription Changes:   Exercise Comments:   Exercise Goals and Review:   Exercise Goals     Row Name 11/21/21 1542             Exercise Goals   Increase Physical Activity Yes       Intervention Provide advice, education, support and counseling about physical activity/exercise needs.;Develop an individualized exercise prescription for aerobic and resistive training based on initial evaluation findings, risk stratification, comorbidities and participant's personal goals.       Expected Outcomes Short Term: Attend rehab on a regular basis to increase amount of physical activity.;Long Term: Add in home exercise to make exercise part of routine and to increase amount of physical activity.;Long Term: Exercising regularly at least 3-5 days a week.       Increase Strength and Stamina Yes       Intervention Provide advice, education, support and counseling about physical activity/exercise needs.;Develop an  individualized exercise prescription for aerobic and resistive training based on initial evaluation findings, risk stratification, comorbidities and participant's personal goals.       Expected Outcomes Short Term: Increase workloads from initial exercise prescription for resistance, speed, and METs.;Short Term: Perform resistance training exercises routinely during rehab and add in resistance training at home;Long Term: Improve cardiorespiratory fitness, muscular endurance and strength as measured by increased METs and functional capacity (6MWT)       Able to understand and use rate of perceived exertion (RPE) scale Yes       Intervention Provide education and explanation on how to use RPE scale       Expected Outcomes Short Term: Able to use RPE daily in rehab to express subjective intensity level;Long Term:  Able to use RPE to guide intensity level when exercising independently       Knowledge and understanding of Target Heart Rate Range (THRR) Yes       Intervention Provide education and explanation of THRR including how the numbers were predicted and where they are located for reference       Expected Outcomes Short Term: Able to state/look up THRR;Short Term: Able to use daily as guideline for intensity in rehab;Long Term: Able to use THRR to govern intensity when exercising independently       Understanding of Exercise Prescription Yes       Intervention Provide education, explanation, and written materials on patient's individual exercise prescription       Expected Outcomes Long Term: Able to explain home exercise prescription to exercise independently;Short Term: Able to explain program exercise prescription                Exercise Goals Re-Evaluation :    Discharge Exercise Prescription (Final Exercise Prescription Changes):   Nutrition:  Target Goals: Understanding of nutrition guidelines, daily intake of sodium 1500mg , cholesterol 200mg , calories 30% from fat and 7% or less  from saturated fats, daily to have 5 or more servings of fruits and vegetables.  Biometrics:  Pre Biometrics - 11/21/21 1030       Pre Biometrics   Waist Circumference 48.5 inches    Hip Circumference 55.5 inches    Waist to Hip Ratio 0.87 %    Triceps Skinfold 62 mm    % Body Fat 57.2 %    Grip Strength 23 kg    Flexibility --   No performed due to law back problems   Single Leg Stand 1 seconds              Nutrition Therapy Plan and Nutrition Goals:   Nutrition Assessments:  MEDIFICTS Score Key: ?70 Need to make dietary changes  40-70 Heart Healthy Diet ? 40 Therapeutic Level Cholesterol Diet   Picture Your Plate Scores: <34 Unhealthy dietary pattern with much room for improvement. 41-50 Dietary pattern unlikely to meet recommendations for good health and room for improvement. 51-60 More healthful dietary pattern, with some room for improvement.  >60 Healthy dietary pattern, although there may be some specific behaviors that could be improved.    Nutrition Goals Re-Evaluation:   Nutrition Goals Discharge (Final Nutrition Goals Re-Evaluation):   Psychosocial: Target Goals: Acknowledge presence or absence of significant depression and/or stress, maximize coping skills, provide positive support system. Participant is able to verbalize types and ability to use techniques and skills needed for reducing stress and depression.  Initial Review & Psychosocial Screening:  Initial Psych Review & Screening - 11/21/21 1654       Initial Review   Current issues with Current Depression;History of Depression;Current Stress Concerns    Source of Stress Concerns Chronic Illness;Unable to participate in former interests or hobbies;Unable to perform yard/household activities    Comments Shreena is currently taking an antidepressant she says is not working.      Family Dynamics   Good Support System? Yes   Lavonna has her husband and son for support.     Barriers    Psychosocial barriers to participate in program The patient should benefit from training in stress management and relaxation.      Screening Interventions   Interventions Encouraged to exercise;To provide support and resources with identified psychosocial needs;Provide feedback about the scores to participant    Expected Outcomes Short Term  goal: Utilizing psychosocial counselor, staff and physician to assist with identification of specific Stressors or current issues interfering with healing process. Setting desired goal for each stressor or current issue identified.;Long Term Goal: Stressors or current issues are controlled or eliminated.             Quality of Life Scores:  Quality of Life - 11/21/21 1533       Quality of Life   Select Quality of Life      Quality of Life Scores   Health/Function Pre 8.9 %    Socioeconomic Pre 24.75 %    Psych/Spiritual Pre 13.71 %    Family Pre 12 %    GLOBAL Pre 13.27 %            Scores of 19 and below usually indicate a poorer quality of life in these areas.  A difference of  2-3 points is a clinically meaningful difference.  A difference of 2-3 points in the total score of the Quality of Life Index has been associated with significant improvement in overall quality of life, self-image, physical symptoms, and general health in studies assessing change in quality of life.  PHQ-9: Recent Review Flowsheet Data     Depression screen [facility] 2/9 11/21/2021   Decreased Interest 0   Down, Depressed, Hopeless 1   PHQ - 2 Score 1      Interpretation of Total Score  Total Score Depression Severity:  1-4 = Minimal depression, 5-9 = Mild depression, 10-14 = Moderate depression, 15-19 = Moderately severe depression, 20-27 = Severe depression   Psychosocial Evaluation and Intervention:   Psychosocial Re-Evaluation:   Psychosocial Discharge (Final Psychosocial Re-Evaluation):   Vocational Rehabilitation: Provide vocational rehab assistance  to qualifying candidates.   Vocational Rehab Evaluation & Intervention:  Vocational Rehab - 11/21/21 1657       Initial Vocational Rehab Evaluation & Intervention   Assessment shows need for Vocational Rehabilitation No   Dianna is retired and does not need vocational rehab at this time            Education: Education Goals: Education classes will be provided on a weekly basis, covering required topics. Participant will state understanding/return demonstration of topics presented.  Learning Barriers/Preferences:  Learning Barriers/Preferences - 11/21/21 1534       Learning Barriers/Preferences   Learning Barriers Sight   wears rearing glasses   Learning Preferences Video;Pictoral;Computer/Internet             Education Topics: Hypertension, Hypertension Reduction -Define heart disease and high blood pressure. Discus how high blood pressure affects the body and ways to reduce high blood pressure.   Exercise and Your Heart -Discuss why it is important to exercise, the FITT principles of exercise, normal and abnormal responses to exercise, and how to exercise safely.   Angina -Discuss definition of angina, causes of angina, treatment of angina, and how to decrease risk of having angina.   Cardiac Medications -Review what the following cardiac medications are used for, how they affect the body, and side effects that may occur when taking the medications.  Medications include Aspirin, Beta blockers, calcium channel blockers, ACE Inhibitors, angiotensin receptor blockers, diuretics, digoxin, and antihyperlipidemics.   Congestive Heart Failure -Discuss the definition of CHF, how to live with CHF, the signs and symptoms of CHF, and how keep track of weight and sodium intake.   Heart Disease and Intimacy -Discus the effect sexual activity has on the heart, how changes occur during intimacy as we  age, and safety during sexual activity.   Smoking Cessation /  COPD -Discuss different methods to quit smoking, the health benefits of quitting smoking, and the definition of COPD.   Nutrition I: Fats -Discuss the types of cholesterol, what cholesterol does to the heart, and how cholesterol levels can be controlled.   Nutrition II: Labels -Discuss the different components of food labels and how to read food label   Heart Parts/Heart Disease and PAD -Discuss the anatomy of the heart, the pathway of blood circulation through the heart, and these are affected by heart disease.   Stress I: Signs and Symptoms -Discuss the causes of stress, how stress may lead to anxiety and depression, and ways to limit stress.   Stress II: Relaxation -Discuss different types of relaxation techniques to limit stress.   Warning Signs of Stroke / TIA -Discuss definition of a stroke, what the signs and symptoms are of a stroke, and how to identify when someone is having stroke.   Knowledge Questionnaire Score:  Knowledge Questionnaire Score - 11/21/21 1534       Knowledge Questionnaire Score   Pre Score 21/24             Core Components/Risk Factors/Patient Goals at Admission:  Personal Goals and Risk Factors at Admission - 11/21/21 1534       Core Components/Risk Factors/Patient Goals on Admission    Weight Management Yes;Obesity;Weight Loss    Intervention Weight Management: Develop a combined nutrition and exercise program designed to reach desired caloric intake, while maintaining appropriate intake of nutrient and fiber, sodium and fats, and appropriate energy expenditure required for the weight goal.;Weight Management: Provide education and appropriate resources to help participant work on and attain dietary goals.;Weight Management/Obesity: Establish reasonable short term and long term weight goals.;Obesity: Provide education and appropriate resources to help participant work on and attain dietary goals.    Admit Weight 239 lb 13.8 oz (108.8 kg)     Expected Outcomes Short Term: Continue to assess and modify interventions until short term weight is achieved;Long Term: Adherence to nutrition and physical activity/exercise program aimed toward attainment of established weight goal;Weight Maintenance: Understanding of the daily nutrition guidelines, which includes 25-35% calories from fat, 7% or less cal from saturated fats, less than 200mg  cholesterol, less than 1.5gm of sodium, & 5 or more servings of fruits and vegetables daily;Weight Loss: Understanding of general recommendations for a balanced deficit meal plan, which promotes 1-2 lb weight loss per week and includes a negative energy balance of [phone] kcal/d;Understanding recommendations for meals to include 15-35% energy as protein, 25-35% energy from fat, 35-60% energy from carbohydrates, less than 200mg  of dietary cholesterol, 20-35 gm of total fiber daily;Understanding of distribution of calorie intake throughout the day with the consumption of 4-5 meals/snacks    Hypertension Yes    Intervention Provide education on lifestyle modifcations including regular physical activity/exercise, weight management, moderate sodium restriction and increased consumption of fresh fruit, vegetables, and low fat dairy, alcohol moderation, and smoking cessation.;Monitor prescription use compliance.    Expected Outcomes Short Term: Continued assessment and intervention until BP is < 140/35mm HG in hypertensive participants. < 130/69mm HG in hypertensive participants with diabetes, heart failure or chronic kidney disease.;Long Term: Maintenance of blood pressure at goal levels.    Stress Yes    Intervention Offer individual and/or small group education and counseling on adjustment to heart disease, stress management and health-related lifestyle change. Teach and support self-help strategies.;Refer participants experiencing significant psychosocial distress to  appropriate mental health specialists for further  evaluation and treatment. When possible, include family members and significant others in education/counseling sessions.    Expected Outcomes Short Term: Participant demonstrates changes in health-related behavior, relaxation and other stress management skills, ability to obtain effective social support, and compliance with psychotropic medications if prescribed.;Long Term: Emotional wellbeing is indicated by absence of clinically significant psychosocial distress or social isolation.             Core Components/Risk Factors/Patient Goals Review:    Core Components/Risk Factors/Patient Goals at Discharge (Final Review):    ITP Comments:  ITP Comments     Row Name 11/21/21 1653           ITP Comments Dr Fransico Him MD, Medical Director                Comments: Edd Fabian attended orientation on 11/21/2021 to review rules and guidelines for program.  Completed 6 minute walk test, Intitial ITP, and exercise prescription.  VSS. Telemetry-Sinus Rhythm. Rasheedah reported having 2/4 shortness of breath please see previous documentation.. Safety measures and social distancing in place per CDC guidelines. Patient is deconditioned.[place] was given dietary handouts with hearth healthy diet information. [name], RN,BSN 11/21/2021 5:00 PM

## 2021-11-21 NOTE — Progress Notes (Signed)
° °  Received page from [place], Verdis Frederickson, with concerns about shortness of breath. Called and spoke with patient. Patient reports shortness of breath since her TAVR in 01/2021. Patient thinks it may be multifactorial due to gradual weight gain and deconditioning from back issues. However, she is up about 15 lbs since her last visit with Dr. Audie Box in 06/2021. She thinks her breathing may be getting worse. She has an appointment with Dr. Audie Box on 01/10/2022 but would like to be seen sooner. I was able to schedule her to see Dr. Audie Box on 12/05/2021. Advised patient to weight herself daily until then and notify us if she has 3lb weight gain in 1 day or 5lb weight gain in 1 week. Also advised her to let us know if shortness of breath worsens before her visit.  Darreld Mclean, PA-C 11/21/2021 12:45 PM

## 2021-11-21 NOTE — Progress Notes (Signed)
Patient reports being short of breath intermittently and says she does not feel any improvement in her shortness of breath post TAVR. Weight today 239.8 kg which is up from her office visit in September with Dr Audie Box. Upon assessment lung fields clear upon assessment bilateral lower extremity edema noted greater on the left this appears to be chronic. Completed 6 minute walk test lowest oxygen saturation was noted at 91%. Highest oxygen saturation was noted at 98% on room air.  Sande Rives [facility] paged and notified. Callie spoke with Edd Fabian over the phone and gave the patient an appointment to see Dr Audie Box on 12/05/21. Callie said that Katherine Waters may proceed with exercise.[name] RN BSN

## 2021-11-21 NOTE — Progress Notes (Signed)
Cardiac Rehab Medication Review by a Nurse  Does the patient  feel that his/her medications are working for him/her?  yes  Has the patient been experiencing any side effects to the medications prescribed?  no  Does the patient measure his/her own blood pressure or blood glucose at home?  no   Does the patient have any problems obtaining medications due to transportation or finances?   no  Understanding of regimen: excellent Understanding of indications: good Potential of compliance: excellent    Nurse comments: Katherine Waters says she is not taking Toprol. Katherine Waters has a blood pressure cuff/monitor. Katherine Waters does not check her blood pressure on a regular basis. Katherine Waters reported that the Toprol was prescribed by [facility] she never filled the prescription.Will notify Dr Debbe Mounts office.[name], RN,BSN 11/21/2021 4:50 PM      [name] RN 11/21/2021 11:30 AM

## 2021-11-25 ENCOUNTER — Other Ambulatory Visit: Payer: Self-pay

## 2021-11-25 ENCOUNTER — Encounter (HOSPITAL_COMMUNITY)
Admission: RE | Admit: 2021-11-25 | Discharge: 2021-11-25 | Disposition: A | Discharge status: Home / Self Care | Payer: Medicare Other | Source: Ambulatory Visit | Attending: Cardiovascular Disease | Admitting: Cardiovascular Disease

## 2021-11-25 DIAGNOSIS — Z952 Presence of prosthetic heart valve: Secondary | ICD-10-CM | POA: Diagnosis present

## 2021-11-25 NOTE — Progress Notes (Signed)
Daily Session Note  Patient Details  Name: Katherine Waters MRN: [id number] Date of Birth: 25-Aug-1951 Referring Provider:   Flowsheet Row CARDIAC REHAB PHASE II ORIENTATION from 11/21/2021 in [place]  Referring Provider Eleonore Chiquito, MD       Encounter Date: 11/25/2021  Check In:  Session Check In - 11/25/21 1103       Check-In   Supervising physician immediately available to respond to emergencies [facility] immediately available    Physician(s) Dr Doristine Bosworth    Location [facility]    Staff Present Esmeralda Links BS, ACSM EP-C, Exercise Physiologist;Merikay Lesniewski, RN, Deland Pretty, MS, ACSM CEP, Exercise Physiologist;David Lilyan Punt, MS, ACSM-CEP, CCRP, Exercise Physiologist    Virtual Visit No    Medication changes reported     No    Fall or balance concerns reported    No    Tobacco Cessation No Change    Warm-up and Cool-down Performed as group-led instruction    Resistance Training Performed Yes    VAD Patient? No    PAD/SET Patient? No      Pain Assessment   Currently in Pain? No/denies    Pain Score 0-No pain    Multiple Pain Sites No             Capillary Blood Glucose: No results found for this or any previous visit (from the past 24 hour(s)).   Exercise Prescription Changes - 11/25/21 1017       Response to Exercise   Blood Pressure (Admit) 124/72    Blood Pressure (Exercise) 148/64    Blood Pressure (Exit) 132/68    Heart Rate (Admit) 84 bpm    Heart Rate (Exercise) 101 bpm    Heart Rate (Exit) 84 bpm    Oxygen Saturation (Admit) 96 %    Oxygen Saturation (Exercise) 96 %    Oxygen Saturation (Exit) 97 %    Rating of Perceived Exertion (Exercise) 12    Perceived Dyspnea (Exercise) 1    Symptoms Mild SOB    Comments Off to a good start with exercise.    Duration Progress to 30 minutes of  aerobic without signs/symptoms of physical distress    Intensity THRR unchanged      Progression    Progression Continue to progress workloads to maintain intensity without signs/symptoms of physical distress.    Average METs 2      Resistance Training   Training Prescription Yes    Weight 2 lbs    Reps 10-15    Time 10 Minutes      Interval Training   Interval Training No      T5 Nustep   Level 1    SPM 85    Minutes 25    METs 2             Social History   Tobacco Use  Smoking Status Former   Packs/day: 0.50   Years: 6.00   Pack years: 3.00   Types: Cigarettes   Quit date: 1976   Years since quitting: 47.1  Smokeless Tobacco Never    Goals Met:  Exercise tolerated well Strength training completed today  Goals Unmet:  Not Applicable  Comments: Katherine Waters started cardiac rehab today.  Pt tolerated light exercise without difficulty. VSS, telemetry-Sinus Rhythm, asymptomatic.  Medication list reconciled. Pt denies barriers to medicaiton compliance.  PSYCHOSOCIAL ASSESSMENT:  PHQ-13. Katherine Waters has a hopeful outlook with supportive family.     Pt enjoys gardening,  sewing, bird watching, photography, cooking and baking.   Pt oriented to exercise equipment and routine.    Understanding verbalized.  QUALITY OF LIFE SCORE REVIEW  Pt completed Quality of Life survey as a participant in Cardiac Rehab.  Scores 21.0 or below are considered low.  Pt score very low in several areas Overall 13.27, Health and Function 8.90, socioeconomic 24.57, physiological and spiritual 13.71, family 12.0. Patient quality of life slightly altered by physical constraints which limits ability to perform as prior to recent cardiac illness.Katherine Waters admits to being depressed and dissatisfied with her health due to her current state of depression. Katherine Waters is currently on an antidepressant which she says is working for her. Katherine Waters is dissatisfied with her shortness of breath and weight. Katherine Waters has an appointment to discuss shortness of breath with Dr Marisue Ivan. Katherine Waters has been through a lot with her recent move from [place].  Valve surgery and back issues. Permission granted to forward to forward to primary care provider Katherine Waters PAC.  and Katherine Waters emotional support and reassurance.  Will continue to monitor and intervene as necessary. [name] was initially tearful about starting exercise but with encouragement completed today session without difficulty. Rest breaks encouraged as needed.[name], RN,BSN 11/25/2021 2:37 PM    Dr. Fransico Him is Medical Director for Cardiac Rehab at [facility].

## 2021-11-27 ENCOUNTER — Other Ambulatory Visit: Payer: Self-pay

## 2021-11-27 ENCOUNTER — Encounter (HOSPITAL_COMMUNITY)
Admission: RE | Admit: 2021-11-27 | Discharge: 2021-11-27 | Disposition: A | Discharge status: Home / Self Care | Payer: Medicare Other | Source: Ambulatory Visit | Attending: Cardiovascular Disease | Admitting: Cardiovascular Disease

## 2021-11-27 DIAGNOSIS — Z952 Presence of prosthetic heart valve: Secondary | ICD-10-CM

## 2021-11-28 ENCOUNTER — Other Ambulatory Visit: Payer: Self-pay | Admitting: Family Medicine

## 2021-11-28 DIAGNOSIS — N83202 Unspecified ovarian cyst, left side: Secondary | ICD-10-CM

## 2021-11-29 ENCOUNTER — Encounter (HOSPITAL_COMMUNITY)
Admission: RE | Admit: 2021-11-29 | Discharge: 2021-11-29 | Disposition: A | Discharge status: Home / Self Care | Payer: Medicare Other | Source: Ambulatory Visit | Attending: Cardiovascular Disease | Admitting: Cardiovascular Disease

## 2021-11-29 ENCOUNTER — Other Ambulatory Visit: Payer: Self-pay

## 2021-11-29 DIAGNOSIS — Z952 Presence of prosthetic heart valve: Secondary | ICD-10-CM

## 2021-12-02 ENCOUNTER — Encounter (HOSPITAL_COMMUNITY)
Admission: RE | Admit: 2021-12-02 | Discharge: 2021-12-02 | Disposition: A | Discharge status: Home / Self Care | Payer: Medicare Other | Source: Ambulatory Visit | Attending: Cardiovascular Disease | Admitting: Cardiovascular Disease

## 2021-12-02 ENCOUNTER — Other Ambulatory Visit: Payer: Self-pay

## 2021-12-02 ENCOUNTER — Telehealth (HOSPITAL_COMMUNITY): Payer: Self-pay | Admitting: Physician Assistant

## 2021-12-02 DIAGNOSIS — Z952 Presence of prosthetic heart valve: Secondary | ICD-10-CM

## 2021-12-02 NOTE — Progress Notes (Signed)
Incomplete Session Note  Patient Details  Name: Katherine Waters MRN: [id number] Date of Birth: [birth year] Referring Provider:   Flowsheet Row CARDIAC REHAB PHASE II ORIENTATION from 11/21/2021 in [place]  Referring Provider Eleonore Chiquito, MD       [name] did not complete her rehab session.  [name] reported that she has been experiencing a pain on her right side that she notices when she lays down at night. [name] says this has been going on for about a week. Blood pressure 138/72. Oxygen saturation 98% on room air. Patient's primary care provider's office called and notified. Patient was told to go to [facility] walk in clinic for evaluation. I advised [name] that she not exercise today. Patient given a copy of her exercise flow sheets to take with her to her appointment.[name], RN,BSN 12/02/2021 12:04 PM

## 2021-12-02 NOTE — Progress Notes (Signed)
Cardiology Office Note:   Date:  12/05/2021  NAME:  Katherine Waters    MRN: [id number] DOB:  [birth year]   PCP:  Maude Leriche, PA-C  Cardiologist:  Evalina Field, MD  Electrophysiologist:  None   Referring MD: Maude Leriche, PA-C   Chief Complaint  Patient presents with   Shortness of Breath        History of Present Illness:   Katherine Waters is a 71 y.o. female with a hx of severe AS s/p TAVR, obesity, HTN who presents for follow-up.  She reports over the past 1 week she has had episode of tightness in her back and into her chest.  She describes starting a new exercise regimen doing movement overhead.  She reports pinpoint pain in the back that radiates into the chest bilaterally.  She was seen by primary care physician and started on prednisone.  She is exquisitely tender in the paraspinal muscles.  Suspect this is a muscle strain.  Left heart cath was normal.  She had a TAVR done.  She does need a follow-up echo.  She continues to report shortness of breath.  She has gained roughly 10 pound since her last appointment.  She reports her relationship with food is unhealthy.  She knows she needs to lose weight.  She is going to start a better diet program.  She does have inflammatory arthritis.  She stopped her biologic medications.  We discussed rechecking ESR and CRP.  She describes no exertional pain or pressure.  She reports she cannot sleep on her back.  Weight is up to 238.  BMI 42.  She had lung test in the past.  They have been unremarkable.  She wishes to go back to cardiac rehab.  I have encouraged this.  No evidence of volume overload today on exam.  Suspect exercise will help improve her symptoms.  Problem List 1. Aortic stenosis s/p TAVR -LHC: normal coronaries 01/29/2021 -29 mm Evolut Pro 02/12/2021 2. HTN 3. HLD 4. OSA, mild 5. COPD, mild 6. 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 Atrial tachycardia  8. HFpEF 9. Pulmonary hypertension  Past Medical History: Past  Medical History:  Diagnosis Date   Arthritis    osteoarthritis   Asthma    Connective tissue disease (HCC)    Depression    Fibromyalgia    GERD (gastroesophageal reflux disease)    Hyperlipidemia    Hypertension    PONV (postoperative nausea and vomiting)    Raynaud disease    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   s/p TAVR w/ a 29 mm Medtronic Evolut Pro + via the TF approach by Dr. Burt Knack and Dr. Roxy Manns    Severe aortic stenosis     Past Surgical History: Past Surgical History:  Procedure Laterality Date   ABDOMINAL HYSTERECTOMY     CARDIAC CATHETERIZATION     CATARACT EXTRACTION, BILATERAL     EYE SURGERY     INTRAOPERATIVE TRANSTHORACIC ECHOCARDIOGRAM Left 02/12/2021   Procedure: INTRAOPERATIVE TRANSTHORACIC ECHOCARDIOGRAM;  Surgeon: Sherren Mocha, MD;  Location: [place];  Service: Open Heart Surgery;  Laterality: Left;   LUMBAR FUSION     POSTERIOR TIBIAL TENDON REPAIR     RIGHT/LEFT HEART CATH AND CORONARY ANGIOGRAPHY N/A 01/29/2021   Procedure: RIGHT/LEFT HEART CATH AND CORONARY ANGIOGRAPHY;  Surgeon: Belva Crome, MD;  Location: [facility];  Service: Cardiovascular;  Laterality: N/A;   ROTATOR CUFF REPAIR     TRANSCATHETER AORTIC VALVE REPLACEMENT, TRANSFEMORAL N/A  02/12/2021   Procedure: TRANSCATHETER AORTIC VALVE REPLACEMENT, TRANSFEMORAL;  Surgeon: Sherren Mocha, MD;  Location: [facility];  Service: Open Heart Surgery;  Laterality: N/A;   TUBAL LIGATION     ULTRASOUND GUIDANCE FOR VASCULAR ACCESS Bilateral 02/12/2021   Procedure: ULTRASOUND GUIDANCE FOR VASCULAR ACCESS;  Surgeon: Sherren Mocha, MD;  Location: [place];  Service: Open Heart Surgery;  Laterality: Bilateral;    Current Medications: Current Meds  Medication Sig   acetaminophen (TYLENOL) 500 MG tablet Take 1,000 mg by mouth See admin instructions. Take 2 tablets (1000 mg) by mouth scheduled in the morning daily, may take an additional dose (1000 mg) as needed for pain    amLODipine (NORVASC) 10 MG tablet Take 10 mg by mouth in the morning.   amoxicillin (AMOXIL) 500 MG tablet Take 4 tablets (2,000 mg total) one hour prior to all dental visits.   APPLE CIDER VINEGAR PO Take 1,600 mg by mouth daily.   aspirin 81 MG chewable tablet Chew 1 tablet (81 mg total) by mouth daily.   Bioflavonoid Products (ESTER C PO) Take 1,000 mg by mouth in the morning.   Cholecalciferol (DIALYVITE VITAMIN D 5000) 125 MCG (5000 UT) capsule Take 5,000 Units by mouth in the morning.   Cyanocobalamin (B-12) 1000 MCG SUBL Place 1,000 mcg under the tongue daily.   cyclobenzaprine (FLEXERIL) 5 MG tablet Take 1 tablet (5 mg total) by mouth 3 (three) times daily as needed for muscle spasms.   DULoxetine (CYMBALTA) 60 MG capsule Take 60 mg by mouth in the morning.   indapamide (LOZOL) 2.5 MG tablet Take 2.5 mg by mouth in the morning.   MAGNESIUM GLYCINATE PO Take 200 mg by mouth in the morning.   meloxicam (MOBIC) 15 MG tablet Take 15 mg by mouth daily.   Multiple Vitamins-Minerals (HAIR/SKIN/NAILS/BIOTIN) TABS Take 1 tablet by mouth in the morning.   pantoprazole (PROTONIX) 40 MG tablet TAKE 1 TABLET BY MOUTH EVERY DAY   predniSONE (DELTASONE) 20 MG tablet Take 2 Tablets in mornings for 5 days,then one tablet every morning for 5 days   telmisartan (MICARDIS) 80 MG tablet Take 80 mg by mouth in the morning.   TURMERIC-GINGER PO Take 1 tablet by mouth daily.     Allergies:    Dobutamine, Latex, and Sulfa antibiotics   Social History: Social History   Socioeconomic History   Marital status: Married    Spouse name: Not on file   Number of children: 1   Years of education: 12   Highest education level: High school graduate  Occupational History   Occupation: retired Radiation protection practitioner  Tobacco Use   Smoking status: Former    Packs/day: 0.50    Years: 6.00    Pack years: 3.00    Types: Cigarettes    Quit date: 1976    Years since quitting: 47.1   Smokeless tobacco: Never  Vaping Use    Vaping Use: Never used  Substance and Sexual Activity   Alcohol use: Yes    Alcohol/week: 1.0 standard drink    Types: 1 Shots of liquor per week    Comment: one drink a week   Drug use: Not Currently   Sexual activity: Not on file  Other Topics Concern   Not on file  Social History Narrative   Not on file   Social Determinants of Health   Financial Resource Strain: Not on file  Food Insecurity: Not on file  Transportation Needs: Not on file  Physical Activity: Not on  file  Stress: Not on file  Social Connections: Not on file     Family History: The patient's family history includes COPD in her mother; Cancer in her sister; Cirrhosis in her brother; Heart attack (age of onset: 6) in her father; Heart disease in her brother.  ROS:   All other ROS reviewed and negative. Pertinent positives noted in the HPI.     EKGs/Labs/Other Studies Reviewed:   The following studies were personally reviewed by me today:  TTE 03/13/2021   1. Left ventricular ejection fraction, by estimation, is >75%. The left  ventricle has hyperdynamic function. The left ventricle has no regional  wall motion abnormalities. There is severe left ventricular hypertrophy.  Left ventricular diastolic  parameters are consistent with Grade I diastolic dysfunction (impaired  relaxation). Elevated left atrial pressure.   2. Right ventricular systolic function is normal. The right ventricular  size is normal.   3. Left atrial size was moderately dilated.   4. The mitral valve is normal in structure. No evidence of mitral valve  regurgitation. No evidence of mitral stenosis. Severe mitral annular  calcification.   5. The aortic valve has been repaired/replaced. Aortic valve  regurgitation is not visualized. No aortic stenosis is present. There is a  29 mm CoreValve-Evolut Pro prosthetic (TAVR) valve present in the aortic  position. Procedure Date: 02/12/21. Echo  findings are consistent with normal structure  and function of the aortic  valve prosthesis. Aortic valve mean gradient measures 6.3 mmHg. Aortic  valve Vmax measures 1.83 m/s.   6. The inferior vena cava is normal in size with greater than 50%  respiratory variability, suggesting right atrial pressure of 3 mmHg.   Recent Labs: 01/10/2021: TSH 1.160 02/08/2021: ALT 20 02/13/2021: BUN 12; Creatinine, Ser 0.67; Hemoglobin 12.0; Magnesium 1.9; Platelets 201; Potassium 3.8; Sodium 137 07/15/2021: BNP 60.2   Recent Lipid Panel    Component Value Date/Time   CHOL 163 01/10/2021 1050   TRIG 98 01/10/2021 1050   HDL 70 01/10/2021 1050   CHOLHDL 2.3 01/10/2021 1050   LDLCALC 75 01/10/2021 1050    Physical Exam:   VS:  BP 140/74    Pulse 83    Ht 5\' 3"  (1.6 m)    Wt 238 lb 3.2 oz (108 kg)    SpO2 97%    BMI 42.20 kg/m    Wt Readings from Last 3 Encounters:  12/05/21 238 lb 3.2 oz (108 kg)  11/21/21 239 lb 13.8 oz (108.8 kg)  07/15/21 224 lb 6.4 oz (101.8 kg)    General: Well nourished, well developed, in no acute distress Head: Atraumatic, normal size  Eyes: PEERLA, EOMI  Neck: Supple, no JVD Endocrine: No thryomegaly Cardiac: Normal S1, S2; RRR; no murmurs, rubs, or gallops Lungs: Clear to auscultation bilaterally, no wheezing, rhonchi or rales  Abd: Soft, nontender, no hepatomegaly  Ext: No edema, pulses 2+ Musculoskeletal: No deformities, BUE and BLE strength normal and equal Skin: Warm and dry, no rashes   Neuro: Alert and oriented to person, place, time, and situation, CNII-XII grossly intact, no focal deficits  Psych: Normal mood and affect   ASSESSMENT:   Mahina Salatino is a 71 y.o. female who presents for the following: 1. SOB (shortness of breath)   2. Chest pain of uncertain etiology   3. Obesity, morbid, BMI 40.0-49.9 (HCC)   4. S/P TAVR (transcatheter aortic valve replacement)   5. Chronic diastolic heart failure (HCC)   6. Pulmonary hypertension, unspecified (HCC)  PLAN:   1. SOB (shortness of breath) -She  reports shortness of breath with exertion.  Weight continues to go up.  No evidence of volume overload.  We will recheck an echocardiogram as well as check a BNP.  Suspect her main issue is obesity and inactivity.  I want her to become active.  This will help her.  2. Chest pain of uncertain etiology -Suspect this is musculoskeletal.  She is exquisitely tender over the paraspinal muscles.  We will give her Flexeril 5 mg 3 times daily as needed.  This should help.  She can stop prednisone.  We will check ESR and CRP just to make sure there is no inflammatory issue here.  Left heart cath was normal.  No murmur on exam.  We will check an echo just to get a good look at her valve again but do not believe this causes her symptoms.  This is symptoms related to overactivity from being inactive for several months.  3. Obesity, morbid, BMI 40.0-49.9 (HCC) -Weight loss was encouraged.  We discussed KVQQVZ.  She will reach out to her primary care physician about this.  4. S/P TAVR (transcatheter aortic valve replacement) -Status post TAVR last year.  Most recent echo shows normal valve function.  We will repeat this.  Continue aspirin 81 mg daily.  She understands she needs SBE prophylaxis.  5. Chronic diastolic heart failure ([place]) 6. Pulmonary hypertension, unspecified ([place]) -She has chronic diastolic heart failure.  I suspect this could be contributing.  We are rechecking a BMP.  Exercise will help her immensely with this.  She also work on salt reduction strategies.  I think some of her pulmonary hypertension could be related to sleep apnea and obesity.  We may need to repeat pulmonary function testing.  Again exercise really would help her.  Disposition: Return if symptoms worsen or fail to improve.  Medication Adjustments/Labs and Tests Ordered: Current medicines are reviewed at length with the patient today.  Concerns regarding medicines are outlined above.  Orders Placed This Encounter  Procedures    Sedimentation rate   C-reactive protein   Brain natriuretic peptide   ECHOCARDIOGRAM COMPLETE   Meds ordered this encounter  Medications   cyclobenzaprine (FLEXERIL) 5 MG tablet    Sig: Take 1 tablet (5 mg total) by mouth 3 (three) times daily as needed for muscle spasms.    Dispense:  30 tablet    Refill:  0    Patient Instructions  Medication Instructions:  Take Flexeril 5 mg three times daily as needed.    *If you need a refill on your cardiac medications before your next appointment, please call your pharmacy*   Lab Work: ESR, CRP, BNP today   If you have labs (blood work) drawn today and your tests are completely normal, you will receive your results only by: MyChart Message (if you have MyChart) OR A paper copy in the mail If you have any lab test that is abnormal or we need to change your treatment, we will call you to review the results.   Testing/Procedures: Echocardiogram - Your physician has requested that you have an echocardiogram. Echocardiography is a painless test that uses sound waves to create images of your heart. It provides your doctor with information about the size and shape of your heart and how well your hearts chambers and valves are working. This procedure takes approximately one hour. There are no restrictions for this procedure. This will be performed at either our [facility] location - [place], [place] location [facility] 2nd floor.    Follow-Up: At [facility], you and your health needs are our priority.  As part of our continuing mission to provide you with exceptional heart care, we have created designated Provider Care Teams.  These Care Teams include your primary Cardiologist (physician) and Advanced Practice Providers (APPs -  Physician Assistants and Nurse Practitioners) who all work together to provide you with the care you need, when you need it.  We recommend signing up for the patient portal  called "MyChart".  Sign up information is provided on this After Visit Summary.  MyChart is used to connect with patients for Virtual Visits (Telemedicine).  Patients are able to view lab/test results, encounter notes, upcoming appointments, etc.  Non-urgent messages can be sent to your provider as well.   To learn more about what you can do with MyChart, go to [email or web address].    Your next appointment:   Keep appointment scheduled in March  The format for your next appointment:   In Person  Provider:   Evalina Field, MD       Time Spent with Patient: I have spent a total of 35 minutes with patient reviewing hospital notes, telemetry, EKGs, labs and examining the patient as well as establishing an assessment and plan that was discussed with the patient.  > 50% of time was spent in direct patient care.  Signed, Addison Naegeli. Audie Box, MD, [place]  [address], [place], [place] [address] [phone]  12/05/2021 12:00 PM

## 2021-12-04 ENCOUNTER — Telehealth (HOSPITAL_COMMUNITY): Payer: Self-pay | Admitting: Physician Assistant

## 2021-12-04 ENCOUNTER — Encounter (HOSPITAL_COMMUNITY): Payer: Medicare Other

## 2021-12-04 NOTE — Progress Notes (Signed)
Cardiac Individual Treatment Plan  Patient Details  Name: Katherine Waters MRN: [id number] Date of Birth: 1951-01-09 Referring Provider:   Flowsheet Row CARDIAC REHAB PHASE II ORIENTATION from 11/21/2021 in [facility]  Referring Provider Eleonore Chiquito, MD       Initial Encounter Date:  Flowsheet Row CARDIAC REHAB PHASE II ORIENTATION from 11/21/2021 in [facility]  Date 11/21/21       Visit Diagnosis: 02/12/21 S/P TAVR   Patient's Home Medications on Admission:  Current Outpatient Medications:    acetaminophen (TYLENOL) 500 MG tablet, Take 1,000 mg by mouth See admin instructions. Take 2 tablets (1000 mg) by mouth scheduled in the morning daily, may take an additional dose (1000 mg) as needed for pain, Disp: , Rfl:    amLODipine (NORVASC) 10 MG tablet, Take 10 mg by mouth in the morning., Disp: , Rfl:    amoxicillin (AMOXIL) 500 MG tablet, Take 4 tablets (2,000 mg total) one hour prior to all dental visits., Disp: 8 tablet, Rfl: 12   APPLE CIDER VINEGAR PO, Take 1,600 mg by mouth daily., Disp: , Rfl:    aspirin 81 MG chewable tablet, Chew 1 tablet (81 mg total) by mouth daily., Disp: , Rfl:    Bioflavonoid Products (ESTER C PO), Take 1,000 mg by mouth in the morning., Disp: , Rfl:    Cholecalciferol (DIALYVITE VITAMIN D 5000) 125 MCG (5000 UT) capsule, Take 5,000 Units by mouth in the morning., Disp: , Rfl:    Cyanocobalamin (B-12) 1000 MCG SUBL, Place 1,000 mcg under the tongue daily., Disp: , Rfl:    DULoxetine (CYMBALTA) 60 MG capsule, Take 60 mg by mouth in the morning., Disp: , Rfl:    indapamide (LOZOL) 2.5 MG tablet, Take 2.5 mg by mouth in the morning., Disp: , Rfl:    MAGNESIUM GLYCINATE PO, Take 200 mg by mouth in the morning., Disp: , Rfl:    meloxicam (MOBIC) 15 MG tablet, Take 15 mg by mouth daily., Disp: , Rfl:    metoprolol succinate (TOPROL XL) 25 MG 24 hr tablet, Take 1 tablet (25 mg total) by mouth daily.  (Patient not taking: Reported on 11/19/2021), Disp: 90 tablet, Rfl: 3   Multiple Vitamins-Minerals (HAIR/SKIN/NAILS/BIOTIN) TABS, Take 1 tablet by mouth in the morning., Disp: , Rfl:    pantoprazole (PROTONIX) 40 MG tablet, TAKE 1 TABLET BY MOUTH EVERY DAY, Disp: 90 tablet, Rfl: 1   telmisartan (MICARDIS) 80 MG tablet, Take 80 mg by mouth in the morning., Disp: , Rfl:    TURMERIC-GINGER PO, Take 1 tablet by mouth daily., Disp: , Rfl:   Past Medical History: Past Medical History:  Diagnosis Date   Arthritis    osteoarthritis   Asthma    Connective tissue disease (HCC)    Depression    Fibromyalgia    GERD (gastroesophageal reflux disease)    Hyperlipidemia    Hypertension    PONV (postoperative nausea and vomiting)    Raynaud disease    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 Burt Knack and Dr. Roxy Manns    Severe aortic stenosis     Tobacco Use: Social History   Tobacco Use  Smoking Status Former   Packs/day: 0.50   Years: 6.00   Pack years: 3.00   Types: Cigarettes   Quit date: 1976   Years since quitting: 47.1  Smokeless Tobacco  Never    Labs: Recent Review Flowsheet Data     Labs for ITP Cardiac and Pulmonary Rehab Latest Ref Rng & Units 02/12/2021 02/12/2021 02/12/2021 02/12/2021 02/12/2021   Cholestrol 100 - 199 mg/dL - - - - -   LDLCALC 0 - 99 mg/dL - - - - -   HDL >39 mg/dL - - - - -   Trlycerides 0 - 149 mg/dL - - - - -   PHART 7.350 - 7.450 - 7.379 - - -   PCO2ART 32.0 - 48.0 mmHg - 46.0 - - -   HCO3 20.0 - 28.0 mmol/L - 27.1 - - -   TCO2 22 - 32 mmol/L 27 29 27 26 24    O2SAT % - 100.0 - - -       Capillary Blood Glucose: No results found for: GLUCAP   Exercise Target Goals: Exercise Program Goal: Individual exercise prescription set using results from initial 6 min walk test and THRR while considering  patients activity barriers and safety.   Exercise Prescription  Goal: Initial exercise prescription builds to 30-45 minutes a day of aerobic activity, 2-3 days per week.  Home exercise guidelines will be given to patient during program as part of exercise prescription that the participant will acknowledge.  Activity Barriers & Risk Stratification:  Activity Barriers & Cardiac Risk Stratification - 11/21/21 1540       Activity Barriers & Cardiac Risk Stratification   Activity Barriers Arthritis;Back Problems;Neck/Spine Problems;Joint Problems;Deconditioning;Muscular Weakness;Shortness of Breath;Balance Concerns    Cardiac Risk Stratification High             6 Minute Walk:  6 Minute Walk     Row Name 11/21/21 1158         6 Minute Walk   Phase Initial     Distance 894 feet     Walk Time 6 minutes     # of Rest Breaks 0     MPH 1.69     METS 1.62     RPE 11     Perceived Dyspnea  2     VO2 Peak 5.66     Symptoms Yes (comment)     Comments SOB, RPD = 2     Resting HR 87 bpm     Resting BP 134/76     Resting Oxygen Saturation  97 %     Exercise Oxygen Saturation  during 6 min walk 92 %     Max Ex. HR 109 bpm     Max Ex. BP 164/80     2 Minute Post BP 146/78       Interval HR   1 Minute HR --  No data     2 Minute HR --  No data     3 Minute HR 104     4 Minute HR 105     5 Minute HR 103     6 Minute HR 108     Interval Heart Rate? Yes       Interval Oxygen   Interval Oxygen? Yes     Baseline Oxygen Saturation % 97 %     1 Minute Oxygen Saturation % --  No value     1 Minute Liters of Oxygen 0 L     2 Minute Oxygen Saturation % --  No value     2 Minute Liters of Oxygen 0 L     3 Minute Oxygen Saturation % 91 %  3 Minute Liters of Oxygen 0 L     4 Minute Oxygen Saturation % 91 %     4 Minute Liters of Oxygen 0 L     5 Minute Oxygen Saturation % 91 %     5 Minute Liters of Oxygen 0 L     6 Minute Oxygen Saturation % 92 %     6 Minute Liters of Oxygen 0 L     2 Minute Post Oxygen Saturation % 98 %     2 Minute Post  Liters of Oxygen 0 L              Oxygen Initial Assessment:   Oxygen Re-Evaluation:   Oxygen Discharge (Final Oxygen Re-Evaluation):   Initial Exercise Prescription:  Initial Exercise Prescription - 11/21/21 1500       Date of Initial Exercise RX and Referring Provider   Date 11/21/21    Referring Provider Eleonore Chiquito, MD    Expected Discharge Date 01/17/22      T5 Nustep   Level 1    SPM 70    Minutes 25    METs 1.6      Prescription Details   Frequency (times per week) 3    Duration Progress to 30 minutes of continuous aerobic without signs/symptoms of physical distress      Intensity   THRR 40-80% of Max Heartrate 60-119    Ratings of Perceived Exertion 11-13    Perceived Dyspnea 0-4      Progression   Progression Continue progressive overload as per policy without signs/symptoms or physical distress.      Resistance Training   Training Prescription Yes    Weight 2 lbs    Reps 10-15             Perform Capillary Blood Glucose checks as needed.  Exercise Prescription Changes:   Exercise Prescription Changes     Row Name 11/25/21 1017             Response to Exercise   Blood Pressure (Admit) 124/72       Blood Pressure (Exercise) 148/64       Blood Pressure (Exit) 132/68       Heart Rate (Admit) 84 bpm       Heart Rate (Exercise) 101 bpm       Heart Rate (Exit) 84 bpm       Oxygen Saturation (Admit) 96 %       Oxygen Saturation (Exercise) 96 %       Oxygen Saturation (Exit) 97 %       Rating of Perceived Exertion (Exercise) 12       Perceived Dyspnea (Exercise) 1       Symptoms Mild SOB       Comments Off to a good start with exercise.       Duration Progress to 30 minutes of  aerobic without signs/symptoms of physical distress       Intensity THRR unchanged         Progression   Progression Continue to progress workloads to maintain intensity without signs/symptoms of physical distress.       Average METs 2          Resistance Training   Training Prescription Yes       Weight 2 lbs       Reps 10-15       Time 10 Minutes         Interval Training  Interval Training No         T5 Nustep   Level 1       SPM 85       Minutes 25       METs 2                Exercise Comments:   Exercise Comments     Row Name 11/25/21 1146 12/02/21 1030         Exercise Comments Patient tolerated low intensity exercise well with mild SOB. Oxygen saturation remained good, 96-97% throughout exercise. No exercise today. Patient reports right upper quadrant pain when lying down, unable to sleep past few days. MD office contacted, office visit scheduled. Will resume exercise once cleared by physician.               Exercise Goals and Review:   Exercise Goals     Row Name 11/21/21 1542             Exercise Goals   Increase Physical Activity Yes       Intervention Provide advice, education, support and counseling about physical activity/exercise needs.;Develop an individualized exercise prescription for aerobic and resistive training based on initial evaluation findings, risk stratification, comorbidities and participant's personal goals.       Expected Outcomes Short Term: Attend rehab on a regular basis to increase amount of physical activity.;Long Term: Add in home exercise to make exercise part of routine and to increase amount of physical activity.;Long Term: Exercising regularly at least 3-5 days a week.       Increase Strength and Stamina Yes       Intervention Provide advice, education, support and counseling about physical activity/exercise needs.;Develop an individualized exercise prescription for aerobic and resistive training based on initial evaluation findings, risk stratification, comorbidities and participant's personal goals.       Expected Outcomes Short Term: Increase workloads from initial exercise prescription for resistance, speed, and METs.;Short Term: Perform resistance training  exercises routinely during rehab and add in resistance training at home;Long Term: Improve cardiorespiratory fitness, muscular endurance and strength as measured by increased METs and functional capacity (6MWT)       Able to understand and use rate of perceived exertion (RPE) scale Yes       Intervention Provide education and explanation on how to use RPE scale       Expected Outcomes Short Term: Able to use RPE daily in rehab to express subjective intensity level;Long Term:  Able to use RPE to guide intensity level when exercising independently       Knowledge and understanding of Target Heart Rate Range (THRR) Yes       Intervention Provide education and explanation of THRR including how the numbers were predicted and where they are located for reference       Expected Outcomes Short Term: Able to state/look up THRR;Short Term: Able to use daily as guideline for intensity in rehab;Long Term: Able to use THRR to govern intensity when exercising independently       Understanding of Exercise Prescription Yes       Intervention Provide education, explanation, and written materials on patient's individual exercise prescription       Expected Outcomes Long Term: Able to explain home exercise prescription to exercise independently;Short Term: Able to explain program exercise prescription                Exercise Goals Re-Evaluation :  Exercise Goals Re-Evaluation  [place] Name 11/25/21 1146             Exercise Goal Re-Evaluation   Exercise Goals Review Increase Physical Activity;Able to check pulse independently;Able to understand and use rate of perceived exertion (RPE) scale       Comments Patient able to understand and use RPE scale appropriately. Patient has smart watch that she's able to use to monitor her pulse.       Expected Outcomes Progress workloads and increase duration as tolerated.                Discharge Exercise Prescription (Final Exercise Prescription Changes):   Exercise Prescription Changes - 11/25/21 1017       Response to Exercise   Blood Pressure (Admit) 124/72    Blood Pressure (Exercise) 148/64    Blood Pressure (Exit) 132/68    Heart Rate (Admit) 84 bpm    Heart Rate (Exercise) 101 bpm    Heart Rate (Exit) 84 bpm    Oxygen Saturation (Admit) 96 %    Oxygen Saturation (Exercise) 96 %    Oxygen Saturation (Exit) 97 %    Rating of Perceived Exertion (Exercise) 12    Perceived Dyspnea (Exercise) 1    Symptoms Mild SOB    Comments Off to a good start with exercise.    Duration Progress to 30 minutes of  aerobic without signs/symptoms of physical distress    Intensity THRR unchanged      Progression   Progression Continue to progress workloads to maintain intensity without signs/symptoms of physical distress.    Average METs 2      Resistance Training   Training Prescription Yes    Weight 2 lbs    Reps 10-15    Time 10 Minutes      Interval Training   Interval Training No      T5 Nustep   Level 1    SPM 85    Minutes 25    METs 2             Nutrition:  Target Goals: Understanding of nutrition guidelines, daily intake of sodium 1500mg , cholesterol 200mg , calories 30% from fat and 7% or less from saturated fats, daily to have 5 or more servings of fruits and vegetables.  Biometrics:  Pre Biometrics - 11/21/21 1030       Pre Biometrics   Waist Circumference 48.5 inches    Hip Circumference 55.5 inches    Waist to Hip Ratio 0.87 %    Triceps Skinfold 62 mm    % Body Fat 57.2 %    Grip Strength 23 kg    Flexibility --   No performed due to law back problems   Single Leg Stand 1 seconds              Nutrition Therapy Plan and Nutrition Goals:   Nutrition Assessments:  MEDIFICTS Score Key: ?70 Need to make dietary changes  40-70 Heart Healthy Diet ? 40 Therapeutic Level Cholesterol Diet    Picture Your Plate Scores: <05 Unhealthy dietary pattern with much room for improvement. 41-50 Dietary  pattern unlikely to meet recommendations for good health and room for improvement. 51-60 More healthful dietary pattern, with some room for improvement.  >60 Healthy dietary pattern, although there may be some specific behaviors that could be improved.    Nutrition Goals Re-Evaluation:   Nutrition Goals Re-Evaluation:   Nutrition Goals Discharge (Final Nutrition Goals Re-Evaluation):   Psychosocial: Target Goals: Acknowledge presence or  absence of significant depression and/or stress, maximize coping skills, provide positive support system. Participant is able to verbalize types and ability to use techniques and skills needed for reducing stress and depression.  Initial Review & Psychosocial Screening:  Initial Psych Review & Screening - 11/21/21 1654       Initial Review   Current issues with Current Depression;History of Depression;Current Stress Concerns    Source of Stress Concerns Chronic Illness;Unable to participate in former interests or hobbies;Unable to perform yard/household activities    Comments Bernette is currently taking an antidepressant she says is not working.      Family Dynamics   Good Support System? Yes   Lenetta has her husband and son for support.     Barriers   Psychosocial barriers to participate in program The patient should benefit from training in stress management and relaxation.      Screening Interventions   Interventions Encouraged to exercise;To provide support and resources with identified psychosocial needs;Provide feedback about the scores to participant    Expected Outcomes Short Term goal: Utilizing psychosocial counselor, staff and physician to assist with identification of specific Stressors or current issues interfering with healing process. Setting desired goal for each stressor or current issue identified.;Long Term Goal: Stressors or current issues are controlled or eliminated.             Quality of Life Scores:  Quality of Life -  11/21/21 1533       Quality of Life   Select Quality of Life      Quality of Life Scores   Health/Function Pre 8.9 %    Socioeconomic Pre 24.75 %    Psych/Spiritual Pre 13.71 %    Family Pre 12 %    GLOBAL Pre 13.27 %            Scores of 19 and below usually indicate a poorer quality of life in these areas.  A difference of  2-3 points is a clinically meaningful difference.  A difference of 2-3 points in the total score of the Quality of Life Index has been associated with significant improvement in overall quality of life, self-image, physical symptoms, and general health in studies assessing change in quality of life.  PHQ-9: Recent Review Flowsheet Data     Depression screen [facility] 2/9 11/25/2021 11/21/2021   Decreased Interest 3 0   Down, Depressed, Hopeless 3 1   PHQ - 2 Score 6 1   Altered sleeping 0 -   Tired, decreased energy 3 -   Change in appetite 3 -   Feeling bad or failure about yourself  1 -   Trouble concentrating 0 -   Moving slowly or fidgety/restless 0 -   Suicidal thoughts 0 -   PHQ-9 Score 13 -   Difficult doing work/chores Somewhat difficult -      Interpretation of Total Score  Total Score Depression Severity:  1-4 = Minimal depression, 5-9 = Mild depression, 10-14 = Moderate depression, 15-19 = Moderately severe depression, 20-27 = Severe depression   Psychosocial Evaluation and Intervention:   Psychosocial Re-Evaluation:  Psychosocial Re-Evaluation     [place] Name 11/25/21 1441             Psychosocial Re-Evaluation   Current issues with Current Stress Concerns;History of Depression;Current Depression       Comments Eddy admits to being depressed today due to health and family concerns. Reviewed quality of life questionnaire and forwarded to primary care provider  Expected Outcomes Nerida will have decreased depression upon completion of phase 2 cardiac rehab       Interventions Stress management education;Relaxation education;Encouraged  to attend Cardiac Rehabilitation for the exercise       Continue Psychosocial Services  Follow up required by staff         Initial Review   Source of Stress Concerns Chronic Illness;Unable to perform yard/household activities;Unable to participate in former interests or hobbies;Family       Comments Will continue to monitor and offer support as needed.                Psychosocial Discharge (Final Psychosocial Re-Evaluation):  Psychosocial Re-Evaluation - 11/25/21 1441       Psychosocial Re-Evaluation   Current issues with Current Stress Concerns;History of Depression;Current Depression    Comments Haidyn admits to being depressed today due to health and family concerns. Reviewed quality of life questionnaire and forwarded to primary care provider    Expected Outcomes Latosha will have decreased depression upon completion of phase 2 cardiac rehab    Interventions Stress management education;Relaxation education;Encouraged to attend Cardiac Rehabilitation for the exercise    Continue Psychosocial Services  Follow up required by staff      Initial Review   Source of Stress Concerns Chronic Illness;Unable to perform yard/household activities;Unable to participate in former interests or hobbies;Family    Comments Will continue to monitor and offer support as needed.             Vocational Rehabilitation: Provide vocational rehab assistance to qualifying candidates.   Vocational Rehab Evaluation & Intervention:  Vocational Rehab - 11/21/21 1657       Initial Vocational Rehab Evaluation & Intervention   Assessment shows need for Vocational Rehabilitation No   Donte is retired and does not need vocational rehab at this time            Education: Education Goals: Education classes will be provided on a weekly basis, covering required topics. Participant will state understanding/return demonstration of topics presented.  Learning Barriers/Preferences:  Learning  Barriers/Preferences - 11/21/21 1534       Learning Barriers/Preferences   Learning Barriers Sight   wears rearing glasses   Learning Preferences Video;Pictoral;Computer/Internet             Education Topics: Count Your Pulse:  -Group instruction provided by verbal instruction, demonstration, patient participation and written materials to support subject.  Instructors address importance of being able to find your pulse and how to count your pulse when at home without a heart monitor.  Patients get hands on experience counting their pulse with staff help and individually.   Heart Attack, Angina, and Risk Factor Modification:  -Group instruction provided by verbal instruction, video, and written materials to support subject.  Instructors address signs and symptoms of angina and heart attacks.    Also discuss risk factors for heart disease and how to make changes to improve heart health risk factors.   Functional Fitness:  -Group instruction provided by verbal instruction, demonstration, patient participation, and written materials to support subject.  Instructors address safety measures for doing things around the house.  Discuss how to get up and down off the floor, how to pick things up properly, how to safely get out of a chair without assistance, and balance training.   Meditation and Mindfulness:  -Group instruction provided by verbal instruction, patient participation, and written materials to support subject.  Instructor addresses importance of mindfulness and meditation practice  to help reduce stress and improve awareness.  Instructor also leads participants through a meditation exercise.    Stretching for Flexibility and Mobility:  -Group instruction provided by verbal instruction, patient participation, and written materials to support subject.  Instructors lead participants through series of stretches that are designed to increase flexibility thus improving mobility.  These  stretches are additional exercise for major muscle groups that are typically performed during regular warm up and cool down.   Hands Only CPR:  -Group verbal, video, and participation provides a basic overview of AHA guidelines for community CPR. Role-play of emergencies allow participants the opportunity to practice calling for help and chest compression technique with discussion of AED use.   Hypertension: -Group verbal and written instruction that provides a basic overview of hypertension including the most recent diagnostic guidelines, risk factor reduction with self-care instructions and medication management.    Nutrition I [place]: Heart Healthy Eating:  -Group instruction provided by PowerPoint slides, verbal discussion, and written materials to support subject matter. The instructor gives an explanation and review of the Therapeutic Lifestyle Changes diet recommendations, which includes a discussion on lipid goals, dietary fat, sodium, fiber, plant stanol/sterol esters, sugar, and the components of a well-balanced, healthy diet.   Nutrition II [place]: Lifestyle Skills:  -Group instruction provided by PowerPoint slides, verbal discussion, and written materials to support subject matter. The instructor gives an explanation and review of label reading, grocery shopping for heart health, heart healthy recipe modifications, and ways to make healthier choices when eating out.   Diabetes Question & Answer:  -Group instruction provided by PowerPoint slides, verbal discussion, and written materials to support subject matter. The instructor gives an explanation and review of diabetes co-morbidities, pre- and post-prandial blood glucose goals, pre-exercise blood glucose goals, signs, symptoms, and treatment of hypoglycemia and hyperglycemia, and foot care basics.   Diabetes Blitz:  -Group instruction provided by PowerPoint slides, verbal discussion, and written materials to support subject  matter. The instructor gives an explanation and review of the physiology behind type 1 and type 2 diabetes, diabetes medications and rational behind using different medications, pre- and post-prandial blood glucose recommendations and Hemoglobin A1c goals, diabetes diet, and exercise including blood glucose guidelines for exercising safely.    Portion Distortion:  -Group instruction provided by PowerPoint slides, verbal discussion, written materials, and food models to support subject matter. The instructor gives an explanation of serving size versus portion size, changes in portions sizes over the last 20 years, and what consists of a serving from each food group.   Stress Management:  -Group instruction provided by verbal instruction, video, and written materials to support subject matter.  Instructors review role of stress in heart disease and how to cope with stress positively.     Exercising on Your Own:  -Group instruction provided by verbal instruction, power point, and written materials to support subject.  Instructors discuss benefits of exercise, components of exercise, frequency and intensity of exercise, and end points for exercise.  Also discuss use of nitroglycerin and activating EMS.  Review options of places to exercise outside of rehab.  Review guidelines for sex with heart disease.   Cardiac Drugs I:  -Group instruction provided by verbal instruction and written materials to support subject.  Instructor reviews cardiac drug classes: antiplatelets, anticoagulants, beta blockers, and statins.  Instructor discusses reasons, side effects, and lifestyle considerations for each drug Mccurley.   Cardiac Drugs II:  -Group instruction provided by verbal instruction and written materials to  support subject.  Instructor reviews cardiac drug classes: angiotensin converting enzyme inhibitors (ACE-I), angiotensin II receptor blockers (ARBs), nitrates, and calcium channel blockers.  Instructor  discusses reasons, side effects, and lifestyle considerations for each drug Fogelman.   Anatomy and Physiology of the Circulatory System:  Group verbal and written instruction and models provide basic cardiac anatomy and physiology, with the coronary electrical and arterial systems. Review of: AMI, Angina, Valve disease, Heart Failure, Peripheral Artery Disease, Cardiac Arrhythmia, Pacemakers, and the ICD.   Other Education:  -Group or individual verbal, written, or video instructions that support the educational goals of the cardiac rehab program.   Holiday Eating Survival Tips:  -Group instruction provided by PowerPoint slides, verbal discussion, and written materials to support subject matter. The instructor gives patients tips, tricks, and techniques to help them not only survive but enjoy the holidays despite the onslaught of food that accompanies the holidays.   Knowledge Questionnaire Score:  Knowledge Questionnaire Score - 11/21/21 1534       Knowledge Questionnaire Score   Pre Score 21/24             Core Components/Risk Factors/Patient Goals at Admission:  Personal Goals and Risk Factors at Admission - 11/21/21 1534       Core Components/Risk Factors/Patient Goals on Admission    Weight Management Yes;Obesity;Weight Loss    Intervention Weight Management: Develop a combined nutrition and exercise program designed to reach desired caloric intake, while maintaining appropriate intake of nutrient and fiber, sodium and fats, and appropriate energy expenditure required for the weight goal.;Weight Management: Provide education and appropriate resources to help participant work on and attain dietary goals.;Weight Management/Obesity: Establish reasonable short term and long term weight goals.;Obesity: Provide education and appropriate resources to help participant work on and attain dietary goals.    Admit Weight 239 lb 13.8 oz (108.8 kg)    Expected Outcomes Short Term: Continue  to assess and modify interventions until short term weight is achieved;Long Term: Adherence to nutrition and physical activity/exercise program aimed toward attainment of established weight goal;Weight Maintenance: Understanding of the daily nutrition guidelines, which includes 25-35% calories from fat, 7% or less cal from saturated fats, less than 200mg  cholesterol, less than 1.5gm of sodium, & 5 or more servings of fruits and vegetables daily;Weight Loss: Understanding of general recommendations for a balanced deficit meal plan, which promotes 1-2 lb weight loss per week and includes a negative energy balance of [phone] kcal/d;Understanding recommendations for meals to include 15-35% energy as protein, 25-35% energy from fat, 35-60% energy from carbohydrates, less than 200mg  of dietary cholesterol, 20-35 gm of total fiber daily;Understanding of distribution of calorie intake throughout the day with the consumption of 4-5 meals/snacks    Hypertension Yes    Intervention Provide education on lifestyle modifcations including regular physical activity/exercise, weight management, moderate sodium restriction and increased consumption of fresh fruit, vegetables, and low fat dairy, alcohol moderation, and smoking cessation.;Monitor prescription use compliance.    Expected Outcomes Short Term: Continued assessment and intervention until BP is < 140/53mm HG in hypertensive participants. < 130/1mm HG in hypertensive participants with diabetes, heart failure or chronic kidney disease.;Long Term: Maintenance of blood pressure at goal levels.    Stress Yes    Intervention Offer individual and/or small group education and counseling on adjustment to heart disease, stress management and health-related lifestyle change. Teach and support self-help strategies.;Refer participants experiencing significant psychosocial distress to appropriate mental health specialists for further evaluation and treatment. When possible,  include  family members and significant others in education/counseling sessions.    Expected Outcomes Short Term: Participant demonstrates changes in health-related behavior, relaxation and other stress management skills, ability to obtain effective social support, and compliance with psychotropic medications if prescribed.;Long Term: Emotional wellbeing is indicated by absence of clinically significant psychosocial distress or social isolation.             Core Components/Risk Factors/Patient Goals Review:   Goals and Risk Factor Review     Row Name 11/25/21 1500 12/03/21 1625           Core Components/Risk Factors/Patient Goals Review   Personal Goals Review Weight Management/Obesity;Hypertension;Lipids Weight Management/Obesity;Hypertension;Lipids      Review Nobuko started exercise on 11/25/21. Wynette did well with exercise for her fitness level Sanaz has good participation in phase 2 cardiac rehab for her fitness level. Shalita continues to experience some shortness of breath. Vital signs and oxygen saturations have been stable. Adaleen is enjoying particiapting in the program. Will continue to monitor. Japleen has follow with Dr Marisue Ivan on 12/05/21.      Expected Outcomes Dorthy will continue to partcipate in phase 2 cardiac rehab for exercise, nutrition and lifestyle modifications. Tyyne will continue to partcipate in phase 2 cardiac rehab for exercise, nutrition and lifestyle modifications.               Core Components/Risk Factors/Patient Goals at Discharge (Final Review):   Goals and Risk Factor Review - 12/03/21 1625       Core Components/Risk Factors/Patient Goals Review   Personal Goals Review Weight Management/Obesity;Hypertension;Lipids    Review Lariah has good participation in phase 2 cardiac rehab for her fitness level. Mareesa continues to experience some shortness of breath. Vital signs and oxygen saturations have been stable. Daris is enjoying particiapting in the program.  Will continue to monitor. Shermeka has follow with Dr Marisue Ivan on 12/05/21.    Expected Outcomes Gwendy will continue to partcipate in phase 2 cardiac rehab for exercise, nutrition and lifestyle modifications.             ITP Comments:  ITP Comments     Row Name 11/21/21 1653 11/25/21 1439 12/03/21 1623       ITP Comments Dr Fransico Him MD, Medical Director 30 Day ITP Review. [facility] re[facility] on 11/621. Samaria did well with exercise. 30 Day ITP Review. Giorgia has a good effort and participation at  cardiac rehab. Encouraged rest breaks due to complaints of shortness of breath.              Comments: See ITP Comments

## 2021-12-05 ENCOUNTER — Ambulatory Visit (INDEPENDENT_AMBULATORY_CARE_PROVIDER_SITE_OTHER): Payer: Medicare Other | Admitting: Cardiovascular Disease

## 2021-12-05 ENCOUNTER — Other Ambulatory Visit: Payer: Self-pay

## 2021-12-05 ENCOUNTER — Encounter: Payer: Self-pay | Admitting: Cardiovascular Disease

## 2021-12-05 VITALS — BP 140/74 | HR 83 | Ht 63.0 in | Wt 238.2 lb

## 2021-12-05 DIAGNOSIS — R079 Chest pain, unspecified: Secondary | ICD-10-CM | POA: Diagnosis not present

## 2021-12-05 DIAGNOSIS — I272 Pulmonary hypertension, unspecified: Secondary | ICD-10-CM

## 2021-12-05 DIAGNOSIS — I5032 Chronic diastolic (congestive) heart failure: Secondary | ICD-10-CM

## 2021-12-05 DIAGNOSIS — Z952 Presence of prosthetic heart valve: Secondary | ICD-10-CM

## 2021-12-05 DIAGNOSIS — R0602 Shortness of breath: Secondary | ICD-10-CM | POA: Diagnosis not present

## 2021-12-05 MED ORDER — CYCLOBENZAPRINE HCL 5 MG PO TABS: 5.0000 mg | ORAL_TABLET | Freq: Three times a day (TID) | ORAL | 0 refills | Status: DC | PRN

## 2021-12-05 NOTE — Patient Instructions (Signed)
Medication Instructions:  Take Flexeril 5 mg three times daily as needed.    *If you need a refill on your cardiac medications before your next appointment, please call your pharmacy*   Lab Work: ESR, CRP, BNP today   If you have labs (blood work) drawn today and your tests are completely normal, you will receive your results only by: MyChart Message (if you have MyChart) OR A paper copy in the mail If you have any lab test that is abnormal or we need to change your treatment, we will call you to review the results.   Testing/Procedures: Echocardiogram - Your physician has requested that you have an echocardiogram. Echocardiography is a painless test that uses sound waves to create images of your heart. It provides your doctor with information about the size and shape of your heart and how well your hearts chambers and valves are working. This procedure takes approximately one hour. There are no restrictions for this procedure. This will be performed at either our [facility] location - [address] [place] location [facility] 2nd floor.    Follow-Up: At [facility], you and your health needs are our priority.  As part of our continuing mission to provide you with exceptional heart care, we have created designated Provider Care Teams.  These Care Teams include your primary Cardiologist (physician) and Advanced Practice Providers (APPs -  Physician Assistants and Nurse Practitioners) who all work together to provide you with the care you need, when you need it.  We recommend signing up for the patient portal called "MyChart".  Sign up information is provided on this After Visit Summary.  MyChart is used to connect with patients for Virtual Visits (Telemedicine).  Patients are able to view lab/test results, encounter notes, upcoming appointments, etc.  Non-urgent messages can be sent to your provider as well.   To learn more about what you can do with  MyChart, go to [email or web address].    Your next appointment:   Keep appointment scheduled in March  The format for your next appointment:   In Person  Provider:   Evalina Field, MD

## 2021-12-06 ENCOUNTER — Encounter (HOSPITAL_COMMUNITY)
Admission: RE | Admit: 2021-12-06 | Discharge: 2021-12-06 | Disposition: A | Discharge status: Home / Self Care | Payer: Medicare Other | Source: Ambulatory Visit | Attending: Cardiovascular Disease | Admitting: Cardiovascular Disease

## 2021-12-06 DIAGNOSIS — Z952 Presence of prosthetic heart valve: Secondary | ICD-10-CM

## 2021-12-06 LAB — BRAIN NATRIURETIC PEPTIDE: BNP: 82.8 pg/mL (ref 0.0–100.0)

## 2021-12-06 LAB — SEDIMENTATION RATE: Sed Rate: 17 mm/hr (ref 0–40)

## 2021-12-06 LAB — C-REACTIVE PROTEIN: CRP: <1 mg/L (ref 0–10)

## 2021-12-06 NOTE — Progress Notes (Signed)
Emileigh returned to cardiac rehab today and exercised at a light level. Medications noted.Will continue to monitor the patient throughout  the program.Brennon Otterness Venetia Maxon, RN,BSN 12/06/2021 11:58 AM

## 2021-12-09 ENCOUNTER — Encounter (HOSPITAL_COMMUNITY)
Admission: RE | Admit: 2021-12-09 | Discharge: 2021-12-09 | Disposition: A | Discharge status: Home / Self Care | Payer: Medicare Other | Source: Ambulatory Visit | Attending: Cardiovascular Disease | Admitting: Cardiovascular Disease

## 2021-12-09 ENCOUNTER — Other Ambulatory Visit: Payer: Self-pay

## 2021-12-09 DIAGNOSIS — Z952 Presence of prosthetic heart valve: Secondary | ICD-10-CM | POA: Diagnosis not present

## 2021-12-10 ENCOUNTER — Ambulatory Visit
Admission: RE | Admit: 2021-12-10 | Discharge: 2021-12-10 | Disposition: A | Discharge status: Home / Self Care | Payer: Medicare Other | Source: Ambulatory Visit | Attending: Family Medicine | Admitting: Family Medicine

## 2021-12-10 DIAGNOSIS — N83202 Unspecified ovarian cyst, left side: Secondary | ICD-10-CM

## 2021-12-11 ENCOUNTER — Other Ambulatory Visit: Payer: Self-pay

## 2021-12-11 ENCOUNTER — Encounter (HOSPITAL_COMMUNITY)
Admission: RE | Admit: 2021-12-11 | Discharge: 2021-12-11 | Disposition: A | Discharge status: Home / Self Care | Payer: Medicare Other | Source: Ambulatory Visit | Attending: Cardiovascular Disease | Admitting: Cardiovascular Disease

## 2021-12-11 DIAGNOSIS — Z952 Presence of prosthetic heart valve: Secondary | ICD-10-CM

## 2021-12-11 NOTE — Progress Notes (Signed)
Reviewed home exercise guidelines with patient including endpoints, temperature precautions, target heart rate and rate of perceived exertion. Patient plans to walk as her mode of home exercise. Patient voices understanding of instructions given.  Cailee Blanke M Dymond Spreen, MS, ACSM CEP  

## 2021-12-13 ENCOUNTER — Encounter (HOSPITAL_COMMUNITY)
Admission: RE | Admit: 2021-12-13 | Discharge: 2021-12-13 | Disposition: A | Discharge status: Home / Self Care | Payer: Medicare Other | Source: Ambulatory Visit | Attending: Cardiovascular Disease | Admitting: Cardiovascular Disease

## 2021-12-13 ENCOUNTER — Other Ambulatory Visit: Payer: Self-pay

## 2021-12-13 DIAGNOSIS — Z952 Presence of prosthetic heart valve: Secondary | ICD-10-CM

## 2021-12-16 ENCOUNTER — Encounter (HOSPITAL_COMMUNITY)
Admission: RE | Admit: 2021-12-16 | Discharge: 2021-12-16 | Disposition: A | Discharge status: Home / Self Care | Payer: Medicare Other | Source: Ambulatory Visit | Attending: Cardiovascular Disease | Admitting: Cardiovascular Disease

## 2021-12-16 ENCOUNTER — Other Ambulatory Visit: Payer: Self-pay

## 2021-12-16 DIAGNOSIS — Z952 Presence of prosthetic heart valve: Secondary | ICD-10-CM

## 2021-12-18 ENCOUNTER — Other Ambulatory Visit: Payer: Self-pay

## 2021-12-18 ENCOUNTER — Encounter (HOSPITAL_COMMUNITY)
Admission: RE | Admit: 2021-12-18 | Discharge: 2021-12-18 | Disposition: A | Discharge status: Home / Self Care | Payer: Medicare Other | Source: Ambulatory Visit | Attending: Cardiovascular Disease | Admitting: Cardiovascular Disease

## 2021-12-18 DIAGNOSIS — Z952 Presence of prosthetic heart valve: Secondary | ICD-10-CM | POA: Diagnosis not present

## 2021-12-19 ENCOUNTER — Ambulatory Visit (HOSPITAL_COMMUNITY): Payer: Medicare Other | Attending: Cardiology

## 2021-12-19 DIAGNOSIS — R0602 Shortness of breath: Secondary | ICD-10-CM | POA: Diagnosis present

## 2021-12-19 LAB — ECHOCARDIOGRAM COMPLETE
AR max vel: 1.97 cm2
AV Area VTI: 2.22 cm2
AV Area mean vel: 1.94 cm2
AV Mean grad: 9 mmHg
AV Peak grad: 14.6 mmHg
Ao pk vel: 1.91 m/s
Area-P 1/2: 2.72 cm2
MV VTI: 1.48 cm2
S' Lateral: 2.6 cm

## 2021-12-20 ENCOUNTER — Other Ambulatory Visit: Payer: Self-pay

## 2021-12-20 ENCOUNTER — Encounter (HOSPITAL_COMMUNITY)
Admission: RE | Admit: 2021-12-20 | Discharge: 2021-12-20 | Disposition: A | Discharge status: Home / Self Care | Payer: Medicare Other | Source: Ambulatory Visit | Attending: Cardiovascular Disease | Admitting: Cardiovascular Disease

## 2021-12-20 DIAGNOSIS — Z952 Presence of prosthetic heart valve: Secondary | ICD-10-CM

## 2021-12-23 ENCOUNTER — Encounter (HOSPITAL_COMMUNITY)
Admission: RE | Admit: 2021-12-23 | Discharge: 2021-12-23 | Disposition: A | Discharge status: Home / Self Care | Payer: Medicare Other | Source: Ambulatory Visit | Attending: Cardiovascular Disease | Admitting: Cardiovascular Disease

## 2021-12-23 ENCOUNTER — Other Ambulatory Visit: Payer: Self-pay

## 2021-12-23 DIAGNOSIS — Z952 Presence of prosthetic heart valve: Secondary | ICD-10-CM

## 2021-12-25 ENCOUNTER — Other Ambulatory Visit: Payer: Self-pay

## 2021-12-25 ENCOUNTER — Encounter (HOSPITAL_COMMUNITY)
Admission: RE | Admit: 2021-12-25 | Discharge: 2021-12-25 | Disposition: A | Discharge status: Home / Self Care | Payer: Medicare Other | Source: Ambulatory Visit | Attending: Cardiovascular Disease | Admitting: Cardiovascular Disease

## 2021-12-25 DIAGNOSIS — Z952 Presence of prosthetic heart valve: Secondary | ICD-10-CM

## 2021-12-27 ENCOUNTER — Other Ambulatory Visit: Payer: Self-pay

## 2021-12-27 ENCOUNTER — Encounter (HOSPITAL_COMMUNITY)
Admission: RE | Admit: 2021-12-27 | Discharge: 2021-12-27 | Disposition: A | Discharge status: Home / Self Care | Payer: Medicare Other | Source: Ambulatory Visit | Attending: Cardiovascular Disease | Admitting: Cardiovascular Disease

## 2021-12-27 DIAGNOSIS — Z952 Presence of prosthetic heart valve: Secondary | ICD-10-CM

## 2021-12-30 ENCOUNTER — Encounter (HOSPITAL_COMMUNITY)
Admission: RE | Admit: 2021-12-30 | Discharge: 2021-12-30 | Disposition: A | Discharge status: Home / Self Care | Payer: Medicare Other | Source: Ambulatory Visit | Attending: Cardiovascular Disease | Admitting: Cardiovascular Disease

## 2021-12-30 ENCOUNTER — Other Ambulatory Visit: Payer: Self-pay

## 2021-12-30 DIAGNOSIS — Z952 Presence of prosthetic heart valve: Secondary | ICD-10-CM

## 2021-12-31 NOTE — Progress Notes (Signed)
Cardiac Individual Treatment Plan ? ?Patient Details  ?Name: Katherine Waters ?MRN: [id number] ?Date of Birth: 09/06/1951 ?Referring Provider:   ?Flowsheet Row CARDIAC REHAB PHASE II ORIENTATION from 11/21/2021 in [facility]  ?Referring Provider Eleonore Chiquito, MD  ? ?  ? ? ?Initial Encounter Date:  ?Flowsheet Row CARDIAC REHAB PHASE II ORIENTATION from 11/21/2021 in [place]  ?Date 11/21/21  ? ?  ? ? ?Visit Diagnosis: 02/12/21 S/P TAVR  ? ?Patient's Home Medications on Admission: ? ?Current Outpatient Medications:  ?  acetaminophen (TYLENOL) 500 MG tablet, Take 1,000 mg by mouth See admin instructions. Take 2 tablets (1000 mg) by mouth scheduled in the morning daily, may take an additional dose (1000 mg) as needed for pain, Disp: , Rfl:  ?  amLODipine (NORVASC) 10 MG tablet, Take 10 mg by mouth in the morning., Disp: , Rfl:  ?  amoxicillin (AMOXIL) 500 MG tablet, Take 4 tablets (2,000 mg total) one hour prior to all dental visits., Disp: 8 tablet, Rfl: 12 ?  APPLE CIDER VINEGAR PO, Take 1,600 mg by mouth daily., Disp: , Rfl:  ?  aspirin 81 MG chewable tablet, Chew 1 tablet (81 mg total) by mouth daily., Disp: , Rfl:  ?  Bioflavonoid Products (ESTER C PO), Take 1,000 mg by mouth in the morning., Disp: , Rfl:  ?  Cholecalciferol (DIALYVITE VITAMIN D 5000) 125 MCG (5000 UT) capsule, Take 5,000 Units by mouth in the morning., Disp: , Rfl:  ?  Cyanocobalamin (B-12) 1000 MCG SUBL, Place 1,000 mcg under the tongue daily., Disp: , Rfl:  ?  cyclobenzaprine (FLEXERIL) 5 MG tablet, Take 1 tablet (5 mg total) by mouth 3 (three) times daily as needed for muscle spasms., Disp: 30 tablet, Rfl: 0 ?  DULoxetine (CYMBALTA) 60 MG capsule, Take 60 mg by mouth in the morning., Disp: , Rfl:  ?  indapamide (LOZOL) 2.5 MG tablet, Take 2.5 mg by mouth in the morning., Disp: , Rfl:  ?  MAGNESIUM GLYCINATE PO, Take 200 mg by mouth in the morning., Disp: , Rfl:  ?  meloxicam (MOBIC) 15 MG  tablet, Take 15 mg by mouth daily., Disp: , Rfl:  ?  metoprolol succinate (TOPROL XL) 25 MG 24 hr tablet, Take 1 tablet (25 mg total) by mouth daily. (Patient not taking: Reported on 12/05/2021), Disp: 90 tablet, Rfl: 3 ?  Multiple Vitamins-Minerals (HAIR/SKIN/NAILS/BIOTIN) TABS, Take 1 tablet by mouth in the morning., Disp: , Rfl:  ?  pantoprazole (PROTONIX) 40 MG tablet, TAKE 1 TABLET BY MOUTH EVERY DAY, Disp: 90 tablet, Rfl: 1 ?  predniSONE (DELTASONE) 20 MG tablet, Take 2 Tablets in mornings for 5 days,then one tablet every morning for 5 days, Disp: , Rfl:  ?  telmisartan (MICARDIS) 80 MG tablet, Take 80 mg by mouth in the morning., Disp: , Rfl:  ?  TURMERIC-GINGER PO, Take 1 tablet by mouth daily., Disp: , Rfl:  ? ?Past Medical History: ?Past Medical History:  ?Diagnosis Date  ? Arthritis   ? osteoarthritis  ? Asthma   ? Connective tissue disease ([place])   ? Depression   ? Fibromyalgia   ? GERD (gastroesophageal reflux disease)   ? Hyperlipidemia   ? Hypertension   ? PONV (postoperative nausea and vomiting)   ? Raynaud disease   ? 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 s/p TAVR w/ a 29 mm Medtronic Evolut Pro + via the TF approach by Dr. Burt Knack and  Dr. Roxy Manns   ? Severe aortic stenosis   ? ? ?Tobacco Use: ?Social History  ? ?Tobacco Use  ?Smoking Status Former  ? Packs/day: 0.50  ? Years: 6.00  ? Pack years: 3.00  ? Types: Cigarettes  ? Quit date: 26  ? Years since quitting: 64.2  ?Smokeless Tobacco Never  ? ? ?Labs: ?Recent Review Flowsheet Data   ? ? Labs for ITP Cardiac and Pulmonary Rehab Latest Ref Rng & Units 02/12/2021 02/12/2021 02/12/2021 02/12/2021 02/12/2021  ? Cholestrol 100 - 199 mg/dL - - - - -  ? LDLCALC 0 - 99 mg/dL - - - - -  ? HDL >39 mg/dL - - - - -  ? Trlycerides 0 - 149 mg/dL - - - - -  ? PHART 7.350 - 7.450 - 7.379 - - -  ? PCO2ART 32.0 - 48.0 mmHg - 46.0 - - -  ? HCO3 20.0 - 28.0 mmol/L - 27.1 - - -  ? TCO2 22 - 32 mmol/L '27 29 27 26 24  '$ ? O2SAT % - 100.0 - - -  ? ?   ? ? ?Capillary Blood Glucose: ?No results found for: GLUCAP ? ? ?Exercise Target Goals: ?Exercise Program Goal: ?Individual exercise prescription set using results from initial 6 min walk test and THRR while considering  patient?s activity barriers and safety.  ? ?Exercise Prescription Goal: ?Initial exercise prescription builds to 30-45 minutes a day of aerobic activity, 2-3 days per week.  Home exercise guidelines will be given to patient during program as part of exercise prescription that the participant will acknowledge. ? ?Activity Barriers & Risk Stratification: ? Activity Barriers & Cardiac Risk Stratification - 11/21/21 1540   ? ?  ? Activity Barriers & Cardiac Risk Stratification  ? Activity Barriers Arthritis;Back Problems;Neck/Spine Problems;Joint Problems;Deconditioning;Muscular Weakness;Shortness of Breath;Balance Concerns   ? Cardiac Risk Stratification High   ? ?  ?  ? ?  ? ? ?6 Minute Walk: ? 6 Minute Walk   ? ? [place] Name 11/21/21 1158  ?  ?  ?  ? 6 Minute Walk  ? Phase Initial    ? Distance 894 feet    ? Walk Time 6 minutes    ? # of Rest Breaks 0    ? MPH 1.69    ? METS 1.62    ? RPE 11    ? Perceived Dyspnea  2    ? VO2 Peak 5.66    ? Symptoms Yes (comment)    ? Comments SOB, RPD = 2    ? Resting HR 87 bpm    ? Resting BP 134/76    ? Resting Oxygen Saturation  97 %    ? Exercise Oxygen Saturation  during 6 min walk 92 %    ? Max Ex. HR 109 bpm    ? Max Ex. BP 164/80    ? 2 Minute Post BP 146/78    ?  ? Interval HR  ? 1 Minute HR --  No data    ? 2 Minute HR --  No data    ? 3 Minute HR 104    ? 4 Minute HR 105    ? 5 Minute HR 103    ? 6 Minute HR 108    ? Interval Heart Rate? Yes    ?  ? Interval Oxygen  ? Interval Oxygen? Yes    ? Baseline Oxygen Saturation % 97 %    ? 1 Minute Oxygen Saturation % --  No value    ? 1 Minute Liters of Oxygen 0 L    ? 2 Minute Oxygen Saturation % --  No value    ? 2 Minute Liters of Oxygen 0 L    ? 3 Minute Oxygen Saturation % 91 %    ? 3 Minute Liters of Oxygen  0 L    ? 4 Minute Oxygen Saturation % 91 %    ? 4 Minute Liters of Oxygen 0 L    ? 5 Minute Oxygen Saturation % 91 %    ? 5 Minute Liters of Oxygen 0 L    ? 6 Minute Oxygen Saturation % 92 %    ? 6 Minute Liters of Oxygen 0 L    ? 2 Minute Post Oxygen Saturation % 98 %    ? 2 Minute Post Liters of Oxygen 0 L    ? ?  ?  ? ?  ? ? ?Oxygen Initial Assessment: ? ? ?Oxygen Re-Evaluation: ? ? ?Oxygen Discharge (Final Oxygen Re-Evaluation): ? ? ?Initial Exercise Prescription: ? Initial Exercise Prescription - 11/21/21 1500   ? ?  ? Date of Initial Exercise RX and Referring Provider  ? Date 11/21/21   ? Referring Provider Eleonore Chiquito, MD   ? Expected Discharge Date 01/17/22   ?  ? T5 Nustep  ? Level 1   ? SPM 70   ? Minutes 25   ? METs 1.6   ?  ? Prescription Details  ? Frequency (times per week) 3   ? Duration Progress to 30 minutes of continuous aerobic without signs/symptoms of physical distress   ?  ? Intensity  ? THRR 40-80% of Max Heartrate 60-119   ? Ratings of Perceived Exertion 11-13   ? Perceived Dyspnea 0-4   ?  ? Progression  ? Progression Continue progressive overload as per policy without signs/symptoms or physical distress.   ?  ? Resistance Training  ? Training Prescription Yes   ? Weight 2 lbs   ? Reps 10-15   ? ?  ?  ? ?  ? ? ?Perform Capillary Blood Glucose checks as needed. ? ?Exercise Prescription Changes: ? ? Exercise Prescription Changes   ? ? [place] Name 11/25/21 1017 12/16/21 1019 12/30/21 1022  ?  ?  ?  ? Response to Exercise  ? Blood Pressure (Admit) 124/72 122/66 132/72    ? Blood Pressure (Exercise) 148/64 128/68 144/62    ? Blood Pressure (Exit) 132/68 110/60 122/72    ? Heart Rate (Admit) 84 bpm 87 bpm 83 bpm    ? Heart Rate (Exercise) 101 bpm 100 bpm 106 bpm    ? Heart Rate (Exit) 84 bpm 87 bpm 83 bpm    ? Oxygen Saturation (Admit) 96 % -- --    ? Oxygen Saturation (Exercise) 96 % 97 % 97 %    ? Oxygen Saturation (Exit) 97 % -- --    ? Rating of Perceived Exertion (Exercise) '12 10 13    '$ ?  Perceived Dyspnea (Exercise) 1 1.5 2    ? Symptoms Mild SOB Mild SOB Mild SOB    ? Comments Off to a good start with exercise. -- --    ? Duration Progress to 30 minutes of  aerobic without signs/symptoms of phy

## 2022-01-01 ENCOUNTER — Encounter (HOSPITAL_COMMUNITY)
Admission: RE | Admit: 2022-01-01 | Discharge: 2022-01-01 | Disposition: A | Discharge status: Home / Self Care | Payer: Medicare Other | Source: Ambulatory Visit | Attending: Cardiovascular Disease | Admitting: Cardiovascular Disease

## 2022-01-01 ENCOUNTER — Other Ambulatory Visit: Payer: Self-pay

## 2022-01-01 DIAGNOSIS — Z952 Presence of prosthetic heart valve: Secondary | ICD-10-CM

## 2022-01-03 ENCOUNTER — Encounter (HOSPITAL_COMMUNITY)
Admission: RE | Admit: 2022-01-03 | Discharge: 2022-01-03 | Disposition: A | Discharge status: Home / Self Care | Payer: Medicare Other | Source: Ambulatory Visit | Attending: Cardiovascular Disease | Admitting: Cardiovascular Disease

## 2022-01-03 ENCOUNTER — Other Ambulatory Visit: Payer: Self-pay

## 2022-01-03 DIAGNOSIS — Z952 Presence of prosthetic heart valve: Secondary | ICD-10-CM | POA: Diagnosis not present

## 2022-01-06 ENCOUNTER — Other Ambulatory Visit: Payer: Self-pay

## 2022-01-06 ENCOUNTER — Encounter (HOSPITAL_COMMUNITY)
Admission: RE | Admit: 2022-01-06 | Discharge: 2022-01-06 | Disposition: A | Discharge status: Home / Self Care | Payer: Medicare Other | Source: Ambulatory Visit | Attending: Cardiovascular Disease | Admitting: Cardiovascular Disease

## 2022-01-06 DIAGNOSIS — Z952 Presence of prosthetic heart valve: Secondary | ICD-10-CM | POA: Diagnosis not present

## 2022-01-08 ENCOUNTER — Encounter (HOSPITAL_COMMUNITY)
Admission: RE | Admit: 2022-01-08 | Discharge: 2022-01-08 | Disposition: A | Discharge status: Home / Self Care | Payer: Medicare Other | Source: Ambulatory Visit | Attending: Cardiovascular Disease | Admitting: Cardiovascular Disease

## 2022-01-08 ENCOUNTER — Other Ambulatory Visit: Payer: Self-pay

## 2022-01-08 DIAGNOSIS — Z952 Presence of prosthetic heart valve: Secondary | ICD-10-CM

## 2022-01-09 NOTE — Progress Notes (Addendum)
?Cardiology Office Note:   ?Date:  01/10/2022  ?NAME:  Katherine Waters    ?MRN: [id number] ?DOB:  04/12/1951  ? ?PCP:  Scifres, Dorothy, PA-C  ?Cardiologist:  Evalina Field, MD  ?Electrophysiologist:  None  ? ?Referring MD: Maude Leriche, PA-C  ? ?Chief Complaint  ?Patient presents with  ? Shortness of Breath  ?   ?  ? ?History of Present Illness:   ?Katherine Waters is a 71 y.o. female with a hx of obesity, HTN, HFpEF, pHTN who presents for follow-up.  She is participate with cardiac rehab.  Still short of breath with exertion.  On review of her records suspect she likely has heart failure with preserved ejection fraction.  We discussed salt reduction strategies as well as weight loss.  We also discussed starting Aldactone.  She is okay to do this.  Reviewed the results of her echo which shows moderate mitral stenosis.  I do not believe this is contributing.  Would recommend conservative approach for this.  For now denies any chest pain symptoms.  Shortness of breath is worsening but we need to get fluid off of her.  I have encouraged her to remain active. ? ?Problem List ?1. Aortic stenosis s/p TAVR ?-LHC: normal coronaries 01/29/2021 ?-29 mm Evolut Pro 02/12/2021 ?2. HTN ?3. HLD ?4. OSA, mild ?5. COPD, mild ?6. Inflammatory arthritis ?-on biologics  ?7. Atrial tachycardia  ?8. HFpEF ?9. Pulmonary hypertension ?-moderate mPAP 35 mmHG PCWP 22 mmHG ?10.  Moderate calcific mitral valve stenosis ? ?Past Medical History: ?Past Medical History:  ?Diagnosis Date  ? Arthritis   ? osteoarthritis  ? Asthma   ? Connective tissue disease ([place])   ? Depression   ? Fibromyalgia   ? GERD (gastroesophageal reflux disease)   ? Hyperlipidemia   ? Hypertension   ? PONV (postoperative nausea and vomiting)   ? Raynaud disease   ? 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 s/p TAVR w/ a 29 mm Medtronic Evolut Pro + via the TF approach by Dr. Burt Knack and Dr. Roxy Manns   ? Severe aortic stenosis   ? ? ?Past Surgical History: ?Past  Surgical History:  ?Procedure Laterality Date  ? ABDOMINAL HYSTERECTOMY    ? CARDIAC CATHETERIZATION    ? CATARACT EXTRACTION, BILATERAL    ? EYE SURGERY    ? INTRAOPERATIVE TRANSTHORACIC ECHOCARDIOGRAM Left 02/12/2021  ? Procedure: INTRAOPERATIVE TRANSTHORACIC ECHOCARDIOGRAM;  Surgeon: Sherren Mocha, MD;  Location: [facility];  Service: Open Heart Surgery;  Laterality: Left;  ? LUMBAR FUSION    ? POSTERIOR TIBIAL TENDON REPAIR    ? RIGHT/LEFT HEART CATH AND CORONARY ANGIOGRAPHY N/A 01/29/2021  ? Procedure: RIGHT/LEFT HEART CATH AND CORONARY ANGIOGRAPHY;  Surgeon: Belva Crome, MD;  Location: [facility];  Service: Cardiovascular;  Laterality: N/A;  ? ROTATOR CUFF REPAIR    ? TRANSCATHETER AORTIC VALVE REPLACEMENT, TRANSFEMORAL N/A 02/12/2021  ? Procedure: TRANSCATHETER AORTIC VALVE REPLACEMENT, TRANSFEMORAL;  Surgeon: Sherren Mocha, MD;  Location: [facility];  Service: Open Heart Surgery;  Laterality: N/A;  ? TUBAL LIGATION    ? ULTRASOUND GUIDANCE FOR VASCULAR ACCESS Bilateral 02/12/2021  ? Procedure: ULTRASOUND GUIDANCE FOR VASCULAR ACCESS;  Surgeon: Sherren Mocha, MD;  Location: [facility];  Service: Open Heart Surgery;  Laterality: Bilateral;  ? ? ?Current Medications: ?Current Meds  ?Medication Sig  ? acetaminophen (TYLENOL) 500 MG tablet Take 1,000 mg by mouth See admin instructions. Take 2 tablets (1000 mg) by mouth scheduled in the morning daily, may  take an additional dose (1000 mg) as needed for pain  ? amLODipine (NORVASC) 10 MG tablet Take 10 mg by mouth in the morning.  ? amoxicillin (AMOXIL) 500 MG tablet Take 4 tablets (2,000 mg total) one hour prior to all dental visits.  ? APPLE CIDER VINEGAR PO Take 1,600 mg by mouth daily.  ? aspirin 81 MG chewable tablet Chew 1 tablet (81 mg total) by mouth daily.  ? Cholecalciferol (DIALYVITE VITAMIN D 5000) 125 MCG (5000 UT) capsule Take 5,000 Units by mouth in the morning.  ? Cyanocobalamin (B-12) 1000 MCG SUBL Place 1,000 mcg under the tongue daily.  ?  DULoxetine (CYMBALTA) 60 MG capsule Take 60 mg by mouth in the morning.  ? indapamide (LOZOL) 2.5 MG tablet Take 2.5 mg by mouth in the morning.  ? loratadine (CLARITIN) 10 MG tablet Take 10 mg by mouth daily.  ? MAGNESIUM GLYCINATE PO Take 200 mg by mouth in the morning.  ? meloxicam (MOBIC) 15 MG tablet Take 15 mg by mouth daily.  ? Multiple Vitamins-Minerals (HAIR/SKIN/NAILS/BIOTIN) TABS Take 1 tablet by mouth in the morning.  ? pantoprazole (PROTONIX) 40 MG tablet TAKE 1 TABLET BY MOUTH EVERY DAY  ? spironolactone (ALDACTONE) 25 MG tablet Take 1 tablet (25 mg total) by mouth daily.  ? telmisartan (MICARDIS) 80 MG tablet Take 80 mg by mouth in the morning.  ? TURMERIC-GINGER PO Take 1 tablet by mouth daily.  ?  ? ?Allergies:    ?Dobutamine, Latex, and Sulfa antibiotics  ? ?Social History: ?Social History  ? ?Socioeconomic History  ? Marital status: Married  ?  Spouse name: Not on file  ? Number of children: 1  ? Years of education: 23  ? Highest education level: High school graduate  ?Occupational History  ? Occupation: retired bookkeeper  ?Tobacco Use  ? Smoking status: Former  ?  Packs/day: 0.50  ?  Years: 6.00  ?  Pack years: 3.00  ?  Types: Cigarettes  ?  Quit date: 14  ?  Years since quitting: 96.2  ? Smokeless tobacco: Never  ?Vaping Use  ? Vaping Use: Never used  ?Substance and Sexual Activity  ? Alcohol use: Yes  ?  Alcohol/week: 1.0 standard drink  ?  Types: 1 Shots of liquor per week  ?  Comment: one drink a week  ? Drug use: Not Currently  ? Sexual activity: Not on file  ?Other Topics Concern  ? Not on file  ?Social History Narrative  ? Not on file  ? ?Social Determinants of Health  ? ?Financial Resource Strain: Not on file  ?Food Insecurity: Not on file  ?Transportation Needs: Not on file  ?Physical Activity: Not on file  ?Stress: Not on file  ?Social Connections: Not on file  ?  ? ?Family History: ?The patient's family history includes COPD in her mother; Cancer in her sister; Cirrhosis in her  brother; Heart attack (age of onset: 23) in her father; Heart disease in her brother. ? ?ROS:   ?All other ROS reviewed and negative. Pertinent positives noted in the HPI.    ? ?EKGs/Labs/Other Studies Reviewed:   ?The following studies were personally reviewed by me today: ? ?Recent Labs: ?01/10/2021: TSH 1.160 ?02/08/2021: ALT 20 ?02/13/2021: BUN 12; Creatinine, Ser 0.67; Hemoglobin 12.0; Magnesium 1.9; Platelets 201; Potassium 3.8; Sodium 137 ?12/05/2021: BNP 82.8  ? ?Recent Lipid Panel ?   ?Component Value Date/Time  ? CHOL 163 01/10/2021 1050  ? TRIG 98 01/10/2021 1050  ?  HDL 70 01/10/2021 1050  ? CHOLHDL 2.3 01/10/2021 1050  ? [place] 75 01/10/2021 1050  ? ? ?Physical Exam:   ?VS:  BP (!) 148/60   Pulse 87   Ht '5\' 3"'$  (1.6 m)   Wt 238 lb (108 kg)   SpO2 95%   BMI 42.16 kg/m?    ?Wt Readings from Last 3 Encounters:  ?01/10/22 238 lb (108 kg)  ?12/05/21 238 lb 3.2 oz (108 kg)  ?11/21/21 239 lb 13.8 oz (108.8 kg)  ?  ?General: Well nourished, well developed, in no acute distress ?Head: Atraumatic, normal size  ?Eyes: PEERLA, EOMI  ?Neck: Supple, no JVD ?Endocrine: No thryomegaly ?Cardiac: Normal S1, S2; RRR; no murmurs, rubs, or gallops ?Lungs: Clear to auscultation bilaterally, no wheezing, rhonchi or rales  ?Abd: Soft, nontender, no hepatomegaly  ?Ext: No edema, pulses 2+ ?Musculoskeletal: No deformities, BUE and BLE strength normal and equal ?Skin: Warm and dry, no rashes   ?Neuro: Alert and oriented to person, place, time, and situation, CNII-XII grossly intact, no focal deficits  ?Psych: Normal mood and affect  ? ?ASSESSMENT:   ?Katherine Waters is a 71 y.o. female who presents for the following: ?1. Chronic diastolic heart failure ([place])   ?2. SOB (shortness of breath)   ?3. S/P TAVR (transcatheter aortic valve replacement)   ?4. Primary hypertension   ?5. Nonrheumatic mitral valve stenosis   ? ? ?PLAN:   ?1. Chronic diastolic heart failure ([place]) ?2. SOB (shortness of breath) ?-Right heart cath last year showed  evidence of elevated pulmonary pressures.  Also elevated wedge pressure.  BNP falsely low in the setting of obesity.  Overall I believe she has heart failure with preserved ejection fraction.  She is allergic to sulfa.

## 2022-01-10 ENCOUNTER — Ambulatory Visit (INDEPENDENT_AMBULATORY_CARE_PROVIDER_SITE_OTHER): Payer: Medicare Other | Admitting: Cardiovascular Disease

## 2022-01-10 ENCOUNTER — Encounter (HOSPITAL_COMMUNITY)
Admission: RE | Admit: 2022-01-10 | Discharge: 2022-01-10 | Disposition: A | Discharge status: Home / Self Care | Payer: Medicare Other | Source: Ambulatory Visit | Attending: Cardiovascular Disease | Admitting: Cardiovascular Disease

## 2022-01-10 ENCOUNTER — Other Ambulatory Visit: Payer: Self-pay

## 2022-01-10 ENCOUNTER — Encounter: Payer: Self-pay | Admitting: Cardiovascular Disease

## 2022-01-10 VITALS — BP 148/60 | HR 87 | Ht 63.0 in | Wt 238.0 lb

## 2022-01-10 DIAGNOSIS — Z952 Presence of prosthetic heart valve: Secondary | ICD-10-CM

## 2022-01-10 DIAGNOSIS — R0602 Shortness of breath: Secondary | ICD-10-CM

## 2022-01-10 DIAGNOSIS — I10 Essential (primary) hypertension: Secondary | ICD-10-CM | POA: Diagnosis not present

## 2022-01-10 DIAGNOSIS — I5032 Chronic diastolic (congestive) heart failure: Secondary | ICD-10-CM | POA: Diagnosis not present

## 2022-01-10 DIAGNOSIS — I342 Nonrheumatic mitral (valve) stenosis: Secondary | ICD-10-CM

## 2022-01-10 MED ORDER — SPIRONOLACTONE 25 MG PO TABS: 25.0000 mg | ORAL_TABLET | Freq: Every day | ORAL | 1 refills | Status: DC

## 2022-01-10 NOTE — Patient Instructions (Signed)
Medication Instructions:  ?START Aldactone 25 mg daily  ? ?*If you need a refill on your cardiac medications before your next appointment, please call your pharmacy* ? ? ?Follow-Up: ?At [facility], you and your health needs are our priority.  As part of our continuing mission to provide you with exceptional heart care, we have created designated Provider Care Teams.  These Care Teams include your primary Cardiologist (physician) and Advanced Practice Providers (APPs -  Physician Assistants and Nurse Practitioners) who all work together to provide you with the care you need, when you need it. ? ?We recommend signing up for the patient portal called "MyChart".  Sign up information is provided on this After Visit Summary.  MyChart is used to connect with patients for Virtual Visits (Telemedicine).  Patients are able to view lab/test results, encounter notes, upcoming appointments, etc.  Non-urgent messages can be sent to your provider as well.   ?To learn more about what you can do with MyChart, go to [email or web address].   ? ?Your next appointment:   ?3-4 month(s) ? ?The format for your next appointment:   ?In Person ? ?Provider:   ?Evalina Field, MD   ? ? ? ?

## 2022-01-13 ENCOUNTER — Other Ambulatory Visit: Payer: Self-pay

## 2022-01-13 ENCOUNTER — Encounter (HOSPITAL_COMMUNITY)
Admission: RE | Admit: 2022-01-13 | Discharge: 2022-01-13 | Disposition: A | Discharge status: Home / Self Care | Payer: Medicare Other | Source: Ambulatory Visit | Attending: Cardiovascular Disease | Admitting: Cardiovascular Disease

## 2022-01-13 DIAGNOSIS — Z952 Presence of prosthetic heart valve: Secondary | ICD-10-CM | POA: Diagnosis not present

## 2022-01-13 NOTE — Progress Notes (Signed)
Katherine Waters reports that she went to the urgent primary care clinic yesterday and was diagnoses with a sinus infection. Katherine Waters says she was planced on Doxycycline and tessalon pearls . After Katherine Waters completed her 6 minute walk test I advised that Katherine Waters go home to rest. Katherine Waters plans to  return to exercise on Wednesday.[name], RN,BSN ?01/13/2022 12:10 PM  ?

## 2022-01-14 ENCOUNTER — Other Ambulatory Visit: Payer: Self-pay | Admitting: Cardiovascular Disease

## 2022-01-15 ENCOUNTER — Encounter (HOSPITAL_COMMUNITY)
Admission: RE | Admit: 2022-01-15 | Discharge: 2022-01-15 | Disposition: A | Discharge status: Home / Self Care | Payer: Medicare Other | Source: Ambulatory Visit | Attending: Cardiovascular Disease | Admitting: Cardiovascular Disease

## 2022-01-15 ENCOUNTER — Other Ambulatory Visit: Payer: Self-pay

## 2022-01-15 DIAGNOSIS — Z952 Presence of prosthetic heart valve: Secondary | ICD-10-CM | POA: Diagnosis not present

## 2022-01-15 NOTE — Progress Notes (Signed)
Discharge Progress Report ? ?Patient Details  ?Name: Katherine Waters ?MRN: [id number] ?Date of Birth: Jun 23, 1951 ?Referring Provider:   ?Flowsheet Row CARDIAC REHAB PHASE II ORIENTATION from 11/21/2021 in [facility]  ?Referring Provider Eleonore Chiquito, MD  ? ?  ? ? ? ?Number of Visits: 22 ? ?Reason for Discharge:  ?Patient reached a stable level of exercise. ?Patient has met program and personal goals. ? ?Smoking History:  ?Social History  ? ?Tobacco Use  ?Smoking Status Former  ? Packs/day: 0.50  ? Years: 6.00  ? Pack years: 3.00  ? Types: Cigarettes  ? Quit date: 51  ? Years since quitting: 47.3  ?Smokeless Tobacco Never  ? ? ?Diagnosis:  ?02/12/21 S/P TAVR  ? ?ADL UCSD: ? ? ?Initial Exercise Prescription: ? Initial Exercise Prescription - 11/21/21 1500   ? ?  ? Date of Initial Exercise RX and Referring Provider  ? Date 11/21/21   ? Referring Provider Eleonore Chiquito, MD   ? Expected Discharge Date 01/17/22   ?  ? T5 Nustep  ? Level 1   ? SPM 70   ? Minutes 25   ? METs 1.6   ?  ? Prescription Details  ? Frequency (times per week) 3   ? Duration Progress to 30 minutes of continuous aerobic without signs/symptoms of physical distress   ?  ? Intensity  ? THRR 40-80% of Max Heartrate 60-119   ? Ratings of Perceived Exertion 11-13   ? Perceived Dyspnea 0-4   ?  ? Progression  ? Progression Continue progressive overload as per policy without signs/symptoms or physical distress.   ?  ? Resistance Training  ? Training Prescription Yes   ? Weight 2 lbs   ? Reps 10-15   ? ?  ?  ? ?  ? ? ?Discharge Exercise Prescription (Final Exercise Prescription Changes): ? Exercise Prescription Changes - 01/17/22 1031   ? ?  ? Response to Exercise  ? Blood Pressure (Admit) 104/74   ? Blood Pressure (Exercise) 120/82   ? Blood Pressure (Exit) 104/72   ? Heart Rate (Admit) 80 bpm   ? Heart Rate (Exercise) 111 bpm   ? Heart Rate (Exit) 78 bpm   ? Rating of Perceived Exertion (Exercise) 13   ? Perceived Dyspnea  (Exercise) 0   ? Comments Last cardiac rehab session.   ? Duration Progress to 30 minutes of  aerobic without signs/symptoms of physical distress   ? Intensity THRR unchanged   ?  ? Progression  ? Progression Continue to progress workloads to maintain intensity without signs/symptoms of physical distress.   ? Average METs 2.2   ?  ? Resistance Training  ? Training Prescription Yes   ? Weight 3 lbs   ? Reps 10-15   ? Time 10 Minutes   ?  ? Interval Training  ? Interval Training No   ?  ? T5 Nustep  ? Level 3   ? SPM 85   ? Minutes 20   ? METs 2.2   ?  ? Track  ? Laps 11   ? Minutes 10   ? METs 2.91   ?  ? Home Exercise Plan  ? Plans to continue exercise at Home (comment)   Walking  ? Frequency Add 2 additional days to program exercise sessions.   ? Initial Home Exercises Provided 12/11/21   ? ?  ?  ? ?  ? ? ?Functional Capacity: ? 6 Minute Walk   ? ?  [place] Name 11/21/21 1158 01/13/22 1040  ?  ?  ? 6 Minute Walk  ? Phase Initial Discharge   ? Distance 894 feet 1286 feet   ? Distance % Change -- 43.85 %   ? Distance Feet Change -- 392 ft   ? Walk Time 6 minutes 6 minutes   ? # of Rest Breaks 0 0   ? MPH 1.69 2.43   ? METS 1.62 2.1   ? RPE 11 13   ? Perceived Dyspnea  2 2   ? VO2 Peak 5.66 7.34   ? Symptoms Yes (comment) Yes (comment)   ? Comments SOB, RPD = 2 Mild SOB, RPD = 2   ? Resting HR 87 bpm 85 bpm   ? Resting BP 134/76 140/76   ? Resting Oxygen Saturation  97 % 97 %   ? Exercise Oxygen Saturation  during 6 min walk 92 % 95 %   ? Max Ex. HR 109 bpm 106 bpm   ? Max Ex. BP 164/80 142/70   ? 2 Minute Post BP 146/78 132/74   ?  ? Interval HR  ? 1 Minute HR --  No data --   ? 2 Minute HR --  No data --   ? 3 Minute HR 104 --   ? 4 Minute HR 105 --   ? 5 Minute HR 103 --   ? 6 Minute HR 108 --   ? Interval Heart Rate? Yes --   ?  ? Interval Oxygen  ? Interval Oxygen? Yes --   ? Baseline Oxygen Saturation % 97 % --   ? 1 Minute Oxygen Saturation % --  No value --   ? 1 Minute Liters of Oxygen 0 L --   ? 2 Minute Oxygen  Saturation % --  No value --   ? 2 Minute Liters of Oxygen 0 L --   ? 3 Minute Oxygen Saturation % 91 % --   ? 3 Minute Liters of Oxygen 0 L --   ? 4 Minute Oxygen Saturation % 91 % --   ? 4 Minute Liters of Oxygen 0 L --   ? 5 Minute Oxygen Saturation % 91 % --   ? 5 Minute Liters of Oxygen 0 L --   ? 6 Minute Oxygen Saturation % 92 % --   ? 6 Minute Liters of Oxygen 0 L --   ? 2 Minute Post Oxygen Saturation % 98 % --   ? 2 Minute Post Liters of Oxygen 0 L --   ? ?  ?  ? ?  ? ? ?Psychological, QOL, Others - Outcomes: ?PHQ 2/9: ? ?  01/15/2022  ? 11:59 AM 11/25/2021  ? 11:38 AM 11/21/2021  ?  4:57 PM  ?Depression screen PHQ 2/9  ?Decreased Interest 0 3 0  ?Down, Depressed, Hopeless 0 3 1  ?PHQ - 2 Score 0 6 1  ?Altered sleeping 0 0   ?Tired, decreased energy 3 3   ?Change in appetite 2 3   ?Feeling bad or failure about yourself  0 1   ?Trouble concentrating 0 0   ?Moving slowly or fidgety/restless 0 0   ?Suicidal thoughts 0 0   ?PHQ-9 Score 5 13   ?Difficult doing work/chores Not difficult at all Somewhat difficult   ? ? ?Quality of Life: ? Quality of Life - 01/01/22 1205   ? ?  ? Quality of Life  ? Select Quality of Life   ?  ?  Quality of Life Scores  ? Health/Function Pre 8.9 %   ? Health/Function Post 9.93 %   ? Health/Function % Change 11.57 %   ? Socioeconomic Pre 24.75 %   ? Socioeconomic Post 21.07 %   ? Socioeconomic % Change  -14.87 %   ? Psych/Spiritual Pre 13.71 %   ? Psych/Spiritual Post 16.64 %   ? Psych/Spiritual % Change 21.37 %   ? Family Pre 12 %   ? Family Post 19.2 %   ? Family % Change 60 %   ? GLOBAL Pre 13.27 %   ? GLOBAL Post 15.12 %   ? GLOBAL % Change 13.94 %   ? ?  ?  ? ?  ? ? ?Personal Goals: ?Goals established at orientation with interventions provided to work toward goal. ? Personal Goals and Risk Factors at Admission - 11/21/21 1534   ? ?  ? Core Components/Risk Factors/Patient Goals on Admission  ?  Weight Management Yes;Obesity;Weight Loss   ? Intervention Weight Management: Develop a  combined nutrition and exercise program designed to reach desired caloric intake, while maintaining appropriate intake of nutrient and fiber, sodium and fats, and appropriate energy expenditure required for the weight goal.;Weight Management: Provide education and appropriate resources to help participant work on and attain dietary goals.;Weight Management/Obesity: Establish reasonable short term and long term weight goals.;Obesity: Provide education and appropriate resources to help participant work on and attain dietary goals.   ? Admit Weight 239 lb 13.8 oz (108.8 kg)   ? Expected Outcomes Short Term: Continue to assess and modify interventions until short term weight is achieved;Long Term: Adherence to nutrition and physical activity/exercise program aimed toward attainment of established weight goal;Weight Maintenance: Understanding of the daily nutrition guidelines, which includes 25-35% calories from fat, 7% or less cal from saturated fats, less than $RemoveB'200mg'MEgoUTro$  cholesterol, less than 1.5gm of sodium, & 5 or more servings of fruits and vegetables daily;Weight Loss: Understanding of general recommendations for a balanced deficit meal plan, which promotes 1-2 lb weight loss per week and includes a negative energy balance of [phone] kcal/d;Understanding recommendations for meals to include 15-35% energy as protein, 25-35% energy from fat, 35-60% energy from carbohydrates, less than $RemoveB'200mg'EBYxEDQk$  of dietary cholesterol, 20-35 gm of total fiber daily;Understanding of distribution of calorie intake throughout the day with the consumption of 4-5 meals/snacks   ? Hypertension Yes   ? Intervention Provide education on lifestyle modifcations including regular physical activity/exercise, weight management, moderate sodium restriction and increased consumption of fresh fruit, vegetables, and low fat dairy, alcohol moderation, and smoking cessation.;Monitor prescription use compliance.   ? Expected Outcomes Short Term: Continued  assessment and intervention until BP is < 140/51mm HG in hypertensive participants. < 130/104mm HG in hypertensive participants with diabetes, heart failure or chronic kidney disease.;Long Term: Maintenance of blood pr

## 2022-01-17 ENCOUNTER — Encounter (HOSPITAL_COMMUNITY)
Admission: RE | Admit: 2022-01-17 | Discharge: 2022-01-17 | Disposition: A | Discharge status: Home / Self Care | Payer: Medicare Other | Source: Ambulatory Visit | Attending: Cardiovascular Disease | Admitting: Cardiovascular Disease

## 2022-01-17 VITALS — BP 104/74 | HR 80 | Ht 62.5 in | Wt 240.1 lb

## 2022-01-17 DIAGNOSIS — Z952 Presence of prosthetic heart valve: Secondary | ICD-10-CM | POA: Diagnosis not present

## 2022-03-04 ENCOUNTER — Telehealth: Payer: Self-pay

## 2022-03-04 ENCOUNTER — Encounter: Payer: Self-pay | Admitting: Cardiovascular Disease

## 2022-03-04 NOTE — Telephone Encounter (Signed)
error 

## 2022-03-04 NOTE — Telephone Encounter (Signed)
? ?  Pre-operative Risk Assessment  ?  ?Patient Name: Katherine Waters  ?DOB: [birth year] ?MRN: [id number]  ? ?  ? ?Request for Surgical Clearance   ? ?Procedure:  Laparoscopic Bilateral Salpingo-Oophprectomy ? ?Date of Surgery:  Clearance 04/02/22                              ?   ?Surgeon:  Dr. Landry Mellow  ?Surgeon's Group or Practice Name:  [facility] ?Phone number:  [phone] ?Fax number:  [phone] ?  ?Type of Clearance Requested:   ?- Medical  ?- Pharmacy:  Hold Aspirin   ?  ?Type of Anesthesia:  Not Indicated ?  ?Additional requests/questions:   ? ?Signed, ?Jacqulynn Cadet   ?03/04/2022, 3:25 PM  ? ?

## 2022-03-05 NOTE — Telephone Encounter (Signed)
? ?  Name: Katherine Waters  ?DOB: [birth year]  ?MRN: [id number] ? ?Primary Cardiologist: Evalina Field, MD ? ?Chart reviewed as part of pre-operative protocol coverage. Because of Lyllie Keegan's past medical history and time since last visit, she will require a follow-up in-office visit in order to better assess preoperative cardiovascular risk. ? ?Pre-op covering staff: ?- Please schedule appointment and call patient to inform them. If patient already had an upcoming appointment within acceptable timeframe, please add "pre-op clearance" to the appointment notes so provider is aware. ?- Please contact requesting surgeon's office via preferred method (i.e, phone, fax) to inform them of need for appointment prior to surgery. ? ?This message will also be routed to Dr. Audie Box for input on holding aspirin as requested below so that this information is available to the clearing provider at time of patient's appointment.  ? ?Lenna Sciara, NP  ?03/05/2022, 3:00 PM   ?

## 2022-03-05 NOTE — Telephone Encounter (Signed)
Pt agreeable to appt with Owens Shark, NP at [facility] for pre op clearance. We have not cancelled the appt in June with Dr. Audie Box. This can be discussed at appt 03/10/22. Pt thanked me for the call today and the help. I will update the requesting office the pt has appt 03/10/22 for pre op clearance.  ?

## 2022-03-05 NOTE — Telephone Encounter (Signed)
Covering Katherine Waters's inbox. Noted, to be addressed at [place], this is already in appt comments. ?

## 2022-03-06 ENCOUNTER — Telehealth: Payer: Self-pay | Admitting: Cardiovascular Disease

## 2022-03-06 NOTE — Telephone Encounter (Signed)
Covering preop today. Have reached out to Dr. Audie Box in secure chat earlier today to confirm if this means to cancel the upcoming OV 5/22, or to consider cleared to hold ASA if doing well at that [place]. He may be out so will route in note as well.

## 2022-03-06 NOTE — Telephone Encounter (Signed)
   Patient Name: Katherine Waters  DOB: Apr 16, 1951 MRN: [id number]  Primary Cardiologist: Evalina Field, MD  Chart reviewed as part of pre-operative protocol coverage. Callback - please let patient know she does not need OV scheduled 5/22 with Laurann Montana. Dr. Audie Box has reviewed her chart and reports she may proceed with surgery as requested without an appointment and can hold aspirin if needed before her procedure. Please ask her to reach out to our team if she has any new or worsening symptoms prior to surgery, otherwise see Dr. Audie Box in June 2023 as scheduled. Will route this bundled recommendation to requesting provider via Epic fax function. Please call with questions.  Charlie Pitter, PA-C 03/06/2022, 3:01 PM

## 2022-03-06 NOTE — Telephone Encounter (Signed)
Pt returning call from nurse regarding appt on 03/10/22. Please advise

## 2022-03-06 NOTE — Telephone Encounter (Signed)
Pt aware and appt has been cancelled.

## 2022-03-06 NOTE — Telephone Encounter (Signed)
LM for pt to call back.

## 2022-03-10 ENCOUNTER — Ambulatory Visit (HOSPITAL_BASED_OUTPATIENT_CLINIC_OR_DEPARTMENT_OTHER): Payer: Medicare Other | Admitting: Family

## 2022-03-16 IMAGING — US US PELVIS COMPLETE WITH TRANSVAGINAL
1 series · 13 of 25 positions shown · non-contrast
Comparison: None

CLINICAL DATA: Left adnexal cyst seen in the previous MRI



[Series 1: us pelvis complete with transvaginal · 0.25mm/px · 42 acquisitions, 13 frames shown]
[im 1/42]
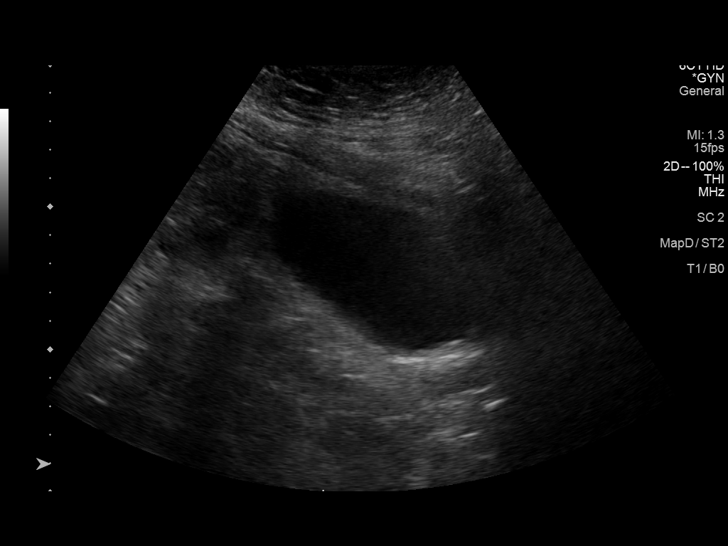
[im 4/42]
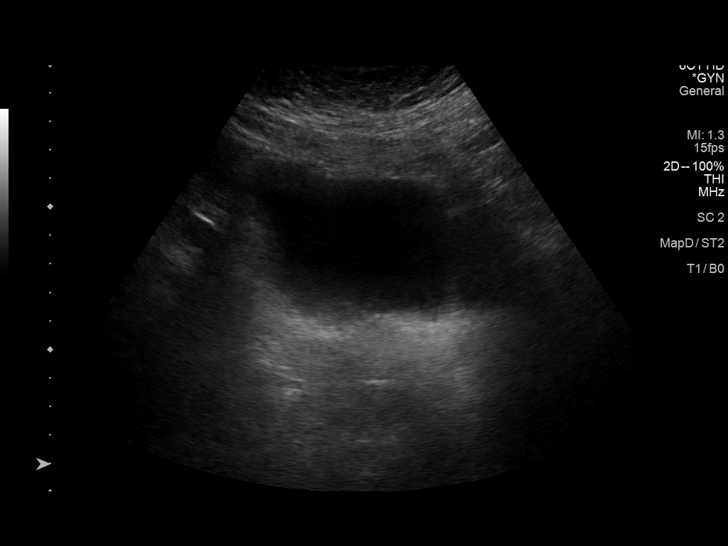
[im 7/42]
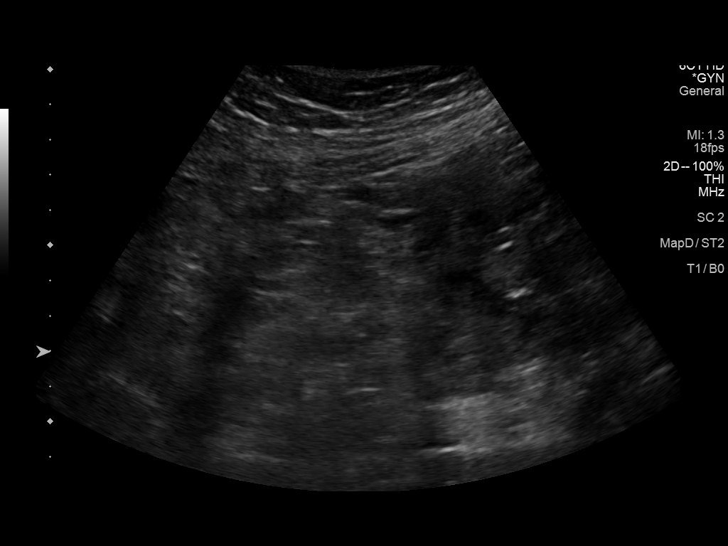
[im 11/42]
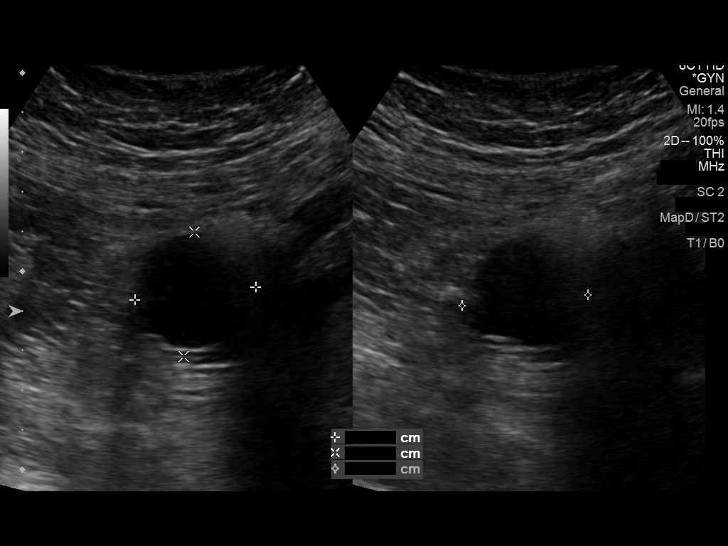
[im 14/42]
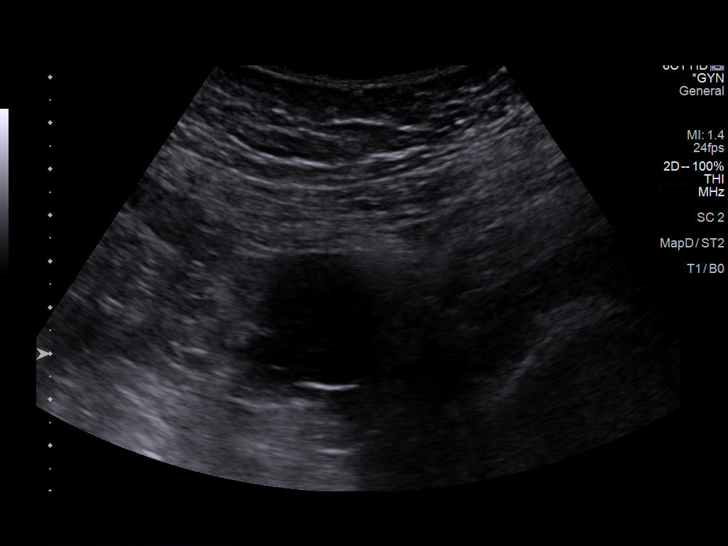
[im 18/42]
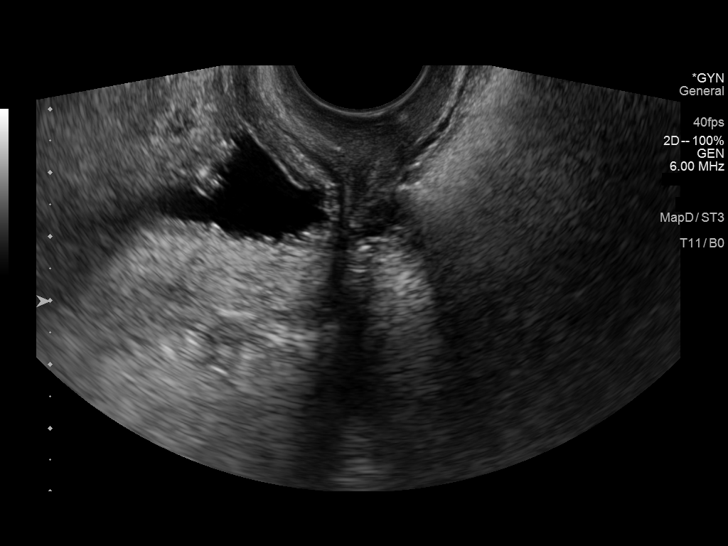
[im 21/42]
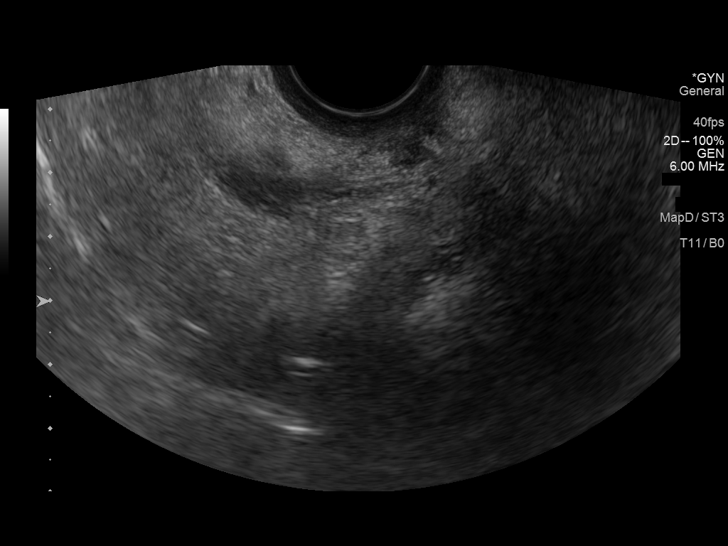
[im 24/42]
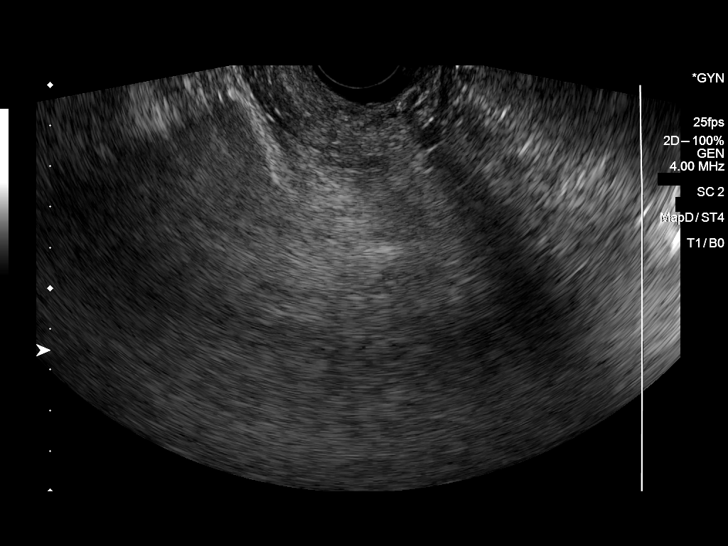
[im 28/42]
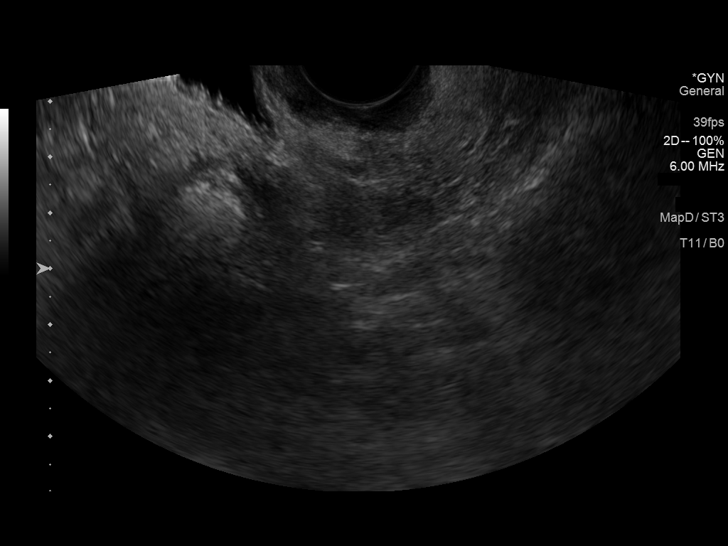
[im 31/42]
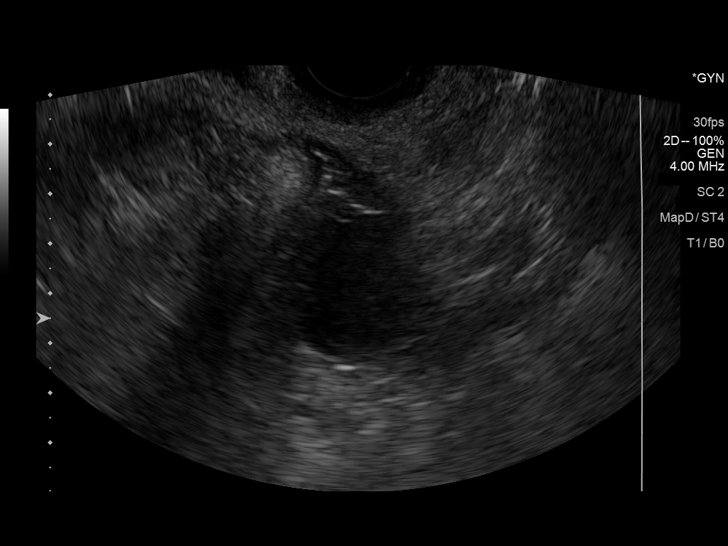
[im 35/42]
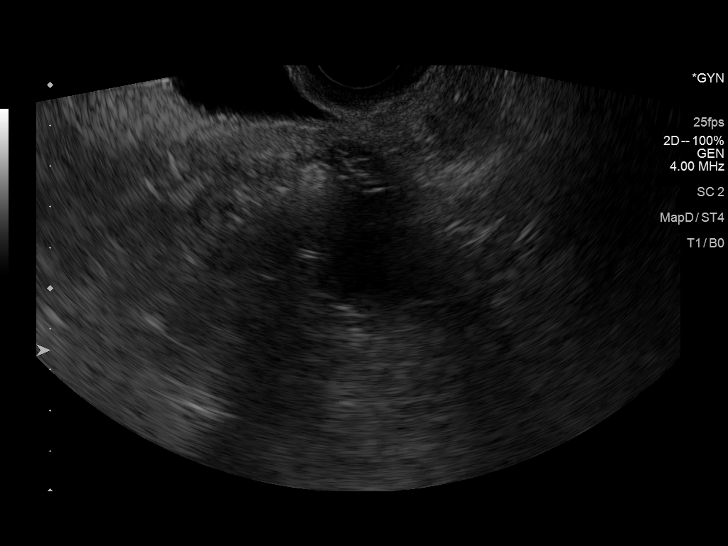
[im 38/42]
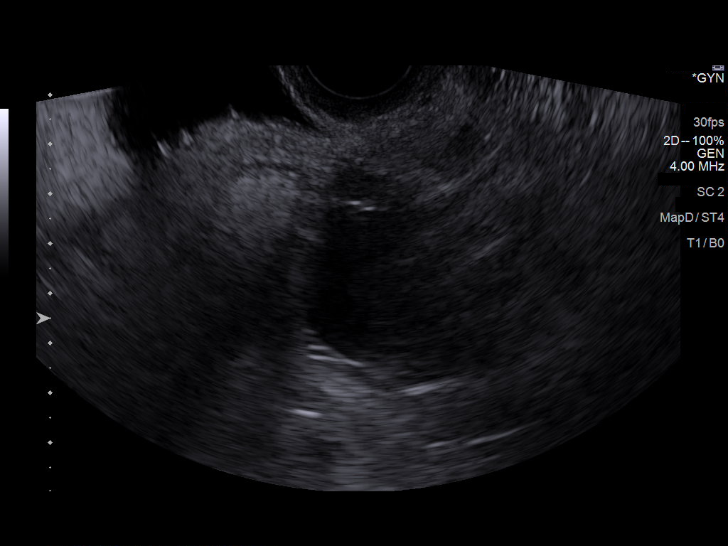
[im 42/42]
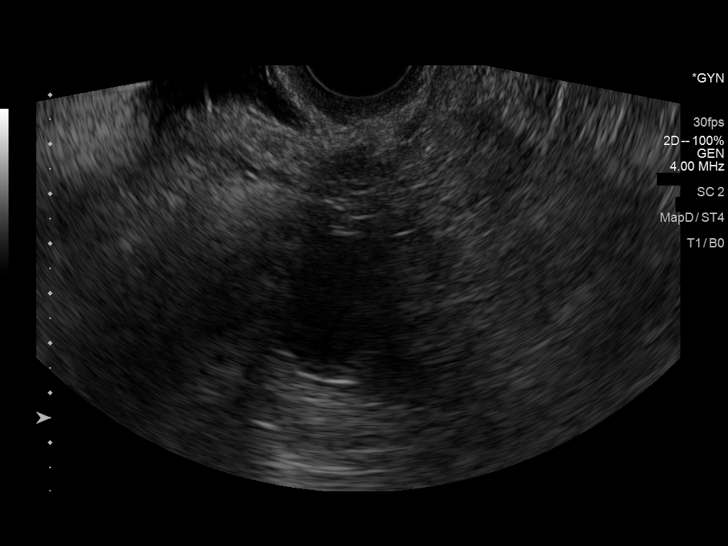

[13 of 25 positions shown; findings below may reference images not displayed]

FINDINGS: Uterus is not seen consistent with hysterectomy.

Right ovary is not sonographically visualized.

Left ovary is not sonographically visualized.

There is 3.1 x 3.1 x 3.2 cm smooth marginated cystic structure with
no internal septations or mural nodules. There are low-level echoes
within this structure.

Other findings

No abnormal free fluid.
IMPRESSION: Status post hysterectomy.  Ovaries not distinctly visualized.

There is 3.2 cm smooth marginated cystic structure without internal
septations or mural nodules in the left pelvis. This may suggest
ovarian cyst or cystic neoplasm. This structure corresponds to the
findings seen in the MRI of lumbar spine. In view of with the
patient's age, follow-up pelvic sonogram in 2-3 months may be
considered to re-evaluate resolution or stability in the lesion.

## 2022-03-31 ENCOUNTER — Encounter (HOSPITAL_COMMUNITY): Payer: Self-pay | Admitting: Obstetrics and Gynecology

## 2022-03-31 NOTE — Progress Notes (Signed)
PCP - Scifres, Dorothy, PA-C Cardiologist - O'Neal, Cassie Freer, MD EKG - DOS Chest x-ray -  ECHO -  Cardiac Cath - 2022 CPAP - no  Aspirin Instructions: Follow your surgeon's instructions on when to stop Aspirin.  If no instructions were given by your surgeon then you will need to call the office to get those instructions.    ERAS Protcol - clears 1030 COVID TEST- n/a  Anesthesia review: yes  -------------  SDW INSTRUCTIONS:  Your procedure is scheduled on 6/14. Please report to [facility] Main Entrance "A" at 11:00 A.M., and check in at the Admitting office. Call this number if you have problems the morning of surgery: [phone]   Remember: Do not eat after midnight the night before your surgery  You may drink clear liquids until 10:30AM the morning of your surgery.   Clear liquids allowed are: Water, Non-Citrus Juices (without pulp), Carbonated Beverages, Clear Tea, Black Coffee Only, and Gatorade   Medications to take morning of surgery with a sip of water include: acetaminophen (TYLENOL) if needed amLODipine (NORVASC) DULoxetine (CYMBALTA) loratadine (CLARITIN)  omeprazole (PRILOSEC) simvastatin (ZOCOR)   Follow your surgeon's instructions on when to stop Aspirin.  If no instructions were given by your surgeon then you will need to call the office to get those instructions.    As of today, STOP taking any Aleve, Naproxen, Ibuprofen, Motrin, Advil, Goody's, BC's, all herbal medications, fish oil, and all vitamins.    The Morning of Surgery Do not wear jewelry, make-up or nail polish. Do not wear lotions, powders, or perfumes, or deodorant Do not bring valuables to the hospital. [facility] is not responsible for any belongings or valuables.  If you are a smoker, DO NOT Smoke 24 hours prior to surgery  If you wear a CPAP at night please bring your mask the morning of surgery   Remember that you must have someone to transport you home after your surgery, and  remain with you for 24 hours if you are discharged the same day.  Please bring cases for contacts, glasses, hearing aids, dentures or bridgework because it cannot be worn into surgery.   Patients discharged the day of surgery will not be allowed to drive home.   Please shower the NIGHT BEFORE/MORNING OF SURGERY (use antibacterial soap like DIAL soap if possible). Wear comfortable clothes the morning of surgery. Oral Hygiene is also important to reduce your risk of infection.  Remember - BRUSH YOUR TEETH THE MORNING OF SURGERY WITH YOUR REGULAR TOOTHPASTE  Patient denies shortness of breath, fever, cough and chest pain.

## 2022-04-01 ENCOUNTER — Other Ambulatory Visit: Payer: Self-pay | Admitting: Obstetrics and Gynecology

## 2022-04-01 DIAGNOSIS — N838 Other noninflammatory disorders of ovary, fallopian tube and broad ligament: Secondary | ICD-10-CM

## 2022-04-01 NOTE — Anesthesia Preprocedure Evaluation (Addendum)
Anesthesia Evaluation  Patient identified by MRN, date of birth, ID band Patient awake    Reviewed: Allergy & Precautions, NPO status , Patient's Chart, lab work & pertinent test results  History of Anesthesia Complications (+) PONV  Airway Mallampati: II  TM Distance: >3 FB Neck ROM: Full  Mouth opening: Limited Mouth Opening  Dental  (+) Dental Advisory Given   Pulmonary former smoker,    breath sounds clear to auscultation       Cardiovascular hypertension, Pt. on medications (-) angina+ Valvular Problems/Murmurs (s/p TAVR, mod-severe MS)  Rhythm:Regular Rate:Normal + Systolic murmurs 10/8839 ECHO: EF 60-65%, normal LVF, mild LVH, Grade 1 DD, normal RVF, mild MR with mod-severe MS, s/p TAVR, possible PFO   Neuro/Psych Depression negative neurological ROS     GI/Hepatic Neg liver ROS, GERD  Medicated and Controlled,  Endo/Other  Morbid obesity  Renal/GU negative Renal ROS     Musculoskeletal  (+) Arthritis , Fibromyalgia -  Abdominal (+) + obese,   Peds  Hematology negative hematology ROS (+)   Anesthesia Other Findings   Reproductive/Obstetrics Neoplasm ovary                           Anesthesia Physical Anesthesia Plan  ASA: 3  Anesthesia Plan: General   Post-op Pain Management: Tylenol PO (pre-op)*   Induction:   PONV Risk Score and Plan: 3 and Ondansetron, Dexamethasone and Treatment may vary due to age or medical condition  Airway Management Planned: Oral ETT and Video Laryngoscope Planned  Additional Equipment: ClearSight  Intra-op Plan:   Post-operative Plan: Extubation in OR  Informed Consent: I have reviewed the patients History and Physical, chart, labs and discussed the procedure including the risks, benefits and alternatives for the proposed anesthesia with the patient or authorized representative who has indicated his/her understanding and acceptance.      Dental advisory given  Plan Discussed with: CRNA and Surgeon  Anesthesia Plan Comments: (PAT note by Karoline Caldwell, PA-C: Follows with cardiology for history of HTN, HFpEF, aortic stenosis s/p TAVR, mod to severe mitral stenosis, moderate pulmonary hypertension, atrial tachycardia.  Normal coronaries by cath April 2022.  Echo March 2023 showed EF 60-65%, normally functioning TAVR, mod to severe MS. Last seen by Dr. Audie Box 01/10/22 and he commented on recent echo stating, "-Moderate mitral valve calcific stenosis. I do not believe this is severe. Could be contributing to her symptoms. Regardless need to get fluid off. We will see how she does. May need TEE in the future."  Recommended continue amlodipine, indapamide, telmisartan.  Added Aldactone.  Recommended consider beta-blocker in the future.  Preop clearance per telephone encounter 03/06/2022 by Melina Copa, PA-C, "Chart reviewed as part of pre-operative protocol coverage.Callback - please let patient know she does not need OV scheduled 5/22 with Laurann Montana. Dr. Audie Box has reviewed her chart and reports she may proceed with surgery as requested without an appointment and can hold aspirin if needed before her procedure. Please ask her to reach out to our team if she has any new or worsening symptoms prior to surgery, otherwise see Dr. Audie Box in June 2023 as scheduled.Will route this bundled recommendation to requesting provider via Epic fax function. 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   Pt will need DOS labs and  eval.   EKG 02/20/2021: Sinus rhythm with frequent PACs.  Rate 86.  TTE 12/19/2021: 1. Left ventricular ejection fraction, by estimation, is 60 to 65%. The  left ventricle has normal function. The left ventricle has no regional  wall motion abnormalities. There  is mild left ventricular hypertrophy.  Left ventricular diastolic parameters  are consistent with Grade I diastolic dysfunction (impaired relaxation).  2. Right ventricular systolic function is normal. The right ventricular  size is normal. Tricuspid regurgitation signal is inadequate for assessing  PA pressure.  3. Left atrial size was moderately dilated.  4. The mitral valve is degenerative. Mild mitral valve regurgitation.  Moderate to severe mitral stenosis. The mean mitral valve gradient is 8.0  mmHg with MVA by VTI 1.48 cm^2. Moderate mitral annular calcification.  5. Bioprosthetic aortic valve s/p TAVR with 29 mm Medtronic Evolut THV.  Mean gradient 9 mmHg. Mild perivalvular leakage (difficult to visualize  with turbulent flow from mitral stenosis.  6. Aortic dilatation noted. There is mild dilatation of the ascending  aorta, measuring 37 mm.  7. The inferior vena cava is normal in size with greater than 50%  respiratory variability, suggesting right atrial pressure of 3 mmHg.  8. Possible PFO vs ASD noted on subcostal window.  Right/left cath 01/29/2021: . Fluoroscopy demonstrated severe aortic valve calcification with minimal movement and heavily calcified mitral annulus. . Right dominant coronary anatomy with widely patent normal coronary arteries. . Severe calcific aortic stenosis.(Greater than 70 mmHg peak gradient, but due to catheter instability/"ejection by LV", typical recordings could not be made).  Marland Kitchen LVEDP 19 mmHg. . Moderate pulmonary with mean pressure 35 mmHg. Pulmonary capillary wedge mean 22 mmHg. Cardiac output 7.8 by Fick 7.4  RECOMMENDATIONS:  . Referred to Dr. Cyndia Bent for surgical consultation as part of the multidisciplinary valve evaluation.   )      Anesthesia Quick Evaluation

## 2022-04-01 NOTE — H&P (Signed)
Reason for Appointment   1. Preop / Left ovarian mass       History of Present Illness  General:          71 y/o presents for preop visit. Pt is schedule for a laparoscopic bilateral salpingo-oophorectomy on Jun. 14, 2023 at 1:30p for the management of complex cystic lesion in left ovary,         She is referred by Maude Leriche PA. Katherine Waters had a pelvic ultrasound on 12/10/2021 that revealed a left ovary measuring 5.3 cm x 3.9 cm x 4.1 cm. It contains a 4.0 cm x 3.1 cm x 2.6 cm cystic lesion with internal septations. Possible small nodularity within the lesion. The lesion is slightly larger compared with pelvic ultrasound performed 09/05/2021.         A risk of ovarian malignancy lab was performed on 12/20/2021 and demonstrated a low risk of malignancy at the time of surgery...    Current Medications  Taking  Omeprazole 40 MG Capsule Delayed Release 1 capsule 30 minutes before morning meal Orally Once a day    Benzonatate 100 MG Capsule 1 capsule as needed Orally Three times a day as needed    DULoxetine HCl 60 MG Capsule Delayed Release Particles 1 capsule Orally Once a day    amLODIPine Besylate 10 MG Tablet 1 tablet Orally Once a day    Indapamide 2.5 MG Tablet 1 tablet in the morning Orally Once a day    Telmisartan 80 MG Tablet 1 tablet Orally Once a day    Simvastatin 40 MG Tablet 1 tablet in the evening Orally Once a day    Spironolactone 25 MG Tablet 1 tablet Orally     Hair/Skin/Nails(Biotin w/ Vitamins C & E) - Capsule 1 capsule Orally Once a day    Meloxicam 15 MG Tablet 1 tablet Orally Once a day    Acetaminophen 500 MG Tablet 2 tablets Orally once a day, Notes: pt says always takes two tabs    Apple Cider Vinegar 500 MG Tablet as directed Orally , Notes: '1500mg'$     Aspirin 81 MG Tablet Chewable 1 tablet Orally Once a day    Claritin(Loratadine) 10 MG Tablet 1 tablet Orally Once a day    Magnesium 200 MG Tablet 1 capsule Orally Once a day     Turmeric-Ginger 150-25 MG Tablet Chewable as directed Orally , Notes: 1950/'300mg'$     Vitamin B12 1000 MCG Tablet Extended Release 1 tablet Orally Once a day    Vitamin D-3 125 MCG (5000 UT) Tablet as directed Orally     Amoxicillin 500 MG Tablet 4 tablets Orally 1 hour prior to denal visits, Notes: only takes before dental appointments   Not-Taking  Vitamin D3 1.25 MG (50000 UT) Capsule 1 capsule Orally     Doxycycline Hyclate 100 MG Tablet 1 tablet Orally Twice a day    Benzonatate 200 MG Capsule 1 capsule Orally at bedtime    predniSONE 20 MG Tablet Take 2 Tablets in mornings for 5 days,then one tablet every morning for 5 days Orally Once a day    HYDROcodone-Acetaminophen 5-325 MG Tablet 1 tablet as needed Orally every 6 hrs    Medication List reviewed and reconciled with the patient         Past Medical History        HTN.         Inflammatory arthritis.         GERD.  Raynauds.         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         Fibromyalgia.         Depression.         Aortic stenosis resolved after new aortic valve.         10/2021 [facility]/Dr. Aryal/Rheumatology, connective tissue disorder/FM.        Surgical History         left posterior tibial tendon repair 2019         L4-L5 fusion 2013         R rotator cuff repair 2011         hysterectomy, still has ovaries          aortic valve replacement 01/2021         cataract removal bilterally 2020         biltareral tubal ligation 6's       Family History   Father: deceased, MI at 16   Mother: deceased, HLD, diagnosed with Hypertension   Paternal [place] Father: deceased   Paternal [place] Mother: deceased   Maternal [place] Father: deceased   Maternal Grand Mother: deceased, colon CA   Brother 1: deceased   Sister 1: deceased   1 son(s) .        Social History  General:   Tobacco use  cigarettes: Former smoker, Quit in year 1976, Tobacco  history last updated 03/18/2022, Vaping No. Alcohol: yes, liquor, 1 per week. Caffeine: yes, coffee, 2 servings daily. no Recreational drug use. Marital Status: married, husband is Delfino Lovett goes by [employer]. Children: son, Aaron Edelman.. step son rick. OCCUPATION: retired. Religion: Methodist/Presbyterian/Episcopal. Seat belt use: yes.      Gyn History  Sexual activity not currently sexually active.   Periods : hysterectomy.   Denies H/O LMP.   Denies H/O Birth control.   Last pap smear date unsure.   Last mammogram date 2019 - normal.       OB History  Number of pregnancies 1.   Pregnancy # 1 boy 34.       Allergies   Latex: itching   Sulfa Antibiotics: itching   Dobutamine: bradycardia   band-aid: itching       Hospitalization/Major Diagnostic Procedure   surgeries    childbirth        Review of Systems  CONSTITUTIONAL:         Chills No. Fatigue No. Fever No. Night sweats No. Recent travel outside [place] No. Sweats No. Weight change No.     OPHTHALMOLOGY:         Blurring of vision no. Change in vision no. Double vision no.     ENT:         Dizziness no. Nose bleeds no. Sore throat no. Teeth pain no.     ALLERGY:         Hives no.     CARDIOLOGY:         Chest pain no. High blood pressure no. Irregular heart beat no. Leg edema no. Palpitations no.     RESPIRATORY:         Shortness of breath no. Cough no. Wheezing no.     UROLOGY:         Pain with urination no. Urinary urgency no. Urinary frequency no. Urinary incontinence no. Difficulty urinating No. Blood in urine No.     GASTROENTEROLOGY:         Abdominal pain no. Appetite  change no. Bloating/belching no. Blood in stool or on toilet paper no. Change in bowel movements no. Constipation no. Diarrhea no. Difficulty swallowing no. Nausea no.     FEMALE REPRODUCTIVE:         Vulvar pain no. Vulvar rash no. Abnormal vaginal bleeding no. Breast pain no. Nipple discharge no. Pain with intercourse no. Pelvic pain no. Unusual  vaginal discharge no. Vaginal itching no.     MUSCULOSKELETAL:         Muscle aches no.     NEUROLOGY:         Headache no. Tingling/numbness no. Weakness no.     PSYCHOLOGY:         Depression no. Anxiety no. Nervousness no. Sleep disturbances no. Suicidal ideation no .     ENDOCRINOLOGY:         Excessive thirst no. Excessive urination no. Hair loss no. Heat or cold intolerance no.     HEMATOLOGY/LYMPH:         Abnormal bleeding no. Easy bruising no. Swollen glands no.     DERMATOLOGY:         New/changing skin lesion no. Rash no. Sores no.        Vital Signs  Wt 245.0, Wt change 5.6 lbs, Ht 61.75, BMI 45.17, Pulse sitting 79, BP sitting 141/73.     Examination  General Examination:       CONSTITUTIONAL: alert, oriented, NAD . SKIN:  moist, warm. EYES:  Conjunctiva clear. LUNGS:  good I:E efffort noted, , clear to auscultation bilaterally. HEART:  regular rate and rhythm. ABDOMEN: soft, non-tender/non-distended, bowel sounds present . FEMALE GENITOURINARY: normal external genitalia, labia - unremarkable, vagina - pink moist mucosa, no lesions or abnormal discharge, .. uterus and cervix are surgically absent . PSYCH:  affect normal, good eye contact.       Physical Examination  Chaperone present:         Chaperone present  Beather Arbour 03/18/2022 09:45:07 AM > , for pelvic exam.            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 D/W pt r/b/a of surgery including but not limited to infections/ bleeding damage to bowel bladder ureters with the need for further surgery. r/o conversion to larger incision. r/o transfusion HIV/ Hepb and c. pt voiced understanding and desires to proceed. she is advised to avoid NSAID 10-14 days prior to surgery. seh is advised to avoid eating or  drinking after midnight the night prior to surgery. Marland Kitchen

## 2022-04-01 NOTE — Progress Notes (Signed)
Anesthesia Chart Review: Same day workup  Follows with cardiology for history of HTN, HFpEF, aortic stenosis s/p TAVR, mod to severe mitral stenosis, moderate pulmonary hypertension, atrial tachycardia.  Normal coronaries by cath April 2022.  Echo March 2023 showed EF 60-65%, normally functioning TAVR, mod to severe MS. Last seen by Dr. Audie Box 01/10/22 and he commented on recent echo stating, "-Moderate mitral valve calcific stenosis.  I do not believe this is severe.  Could be contributing to her symptoms.  Regardless need to get fluid off.  We will see how she does.  May need TEE in the future."  Recommended continue amlodipine, indapamide, telmisartan.  Added Aldactone.  Recommended consider beta-blocker in the future.  Preop clearance per telephone encounter 03/06/2022 by Melina Copa, PA-C, "Chart reviewed as part of pre-operative protocol coverage. Callback - please let patient know she does not need OV scheduled 5/22 with Laurann Montana. Dr. Audie Box has reviewed her chart and reports she may proceed with surgery as requested without an appointment and can hold aspirin if needed before her procedure. Please ask her to reach out to our team if she has any new or worsening symptoms prior to surgery, otherwise see Dr. Audie Box in June 2023 as scheduled. Will route this bundled recommendation to requesting provider via Epic fax function. 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   Pt will need DOS labs and eval.   EKG 02/20/2021: Sinus rhythm with frequent PACs.  Rate 86.  TTE 12/19/2021:  1. Left ventricular ejection fraction, by estimation, is 60 to 65%. The  left ventricle has normal function. The left ventricle has no regional  wall motion abnormalities. There is mild left ventricular hypertrophy.  Left ventricular diastolic parameters  are  consistent with Grade I diastolic dysfunction (impaired relaxation).   2. Right ventricular systolic function is normal. The right ventricular  size is normal. Tricuspid regurgitation signal is inadequate for assessing  PA pressure.   3. Left atrial size was moderately dilated.   4. The mitral valve is degenerative. Mild mitral valve regurgitation.  Moderate to severe mitral stenosis. The mean mitral valve gradient is 8.0  mmHg with MVA by VTI 1.48 cm^2. Moderate mitral annular calcification.   5. Bioprosthetic aortic valve s/p TAVR with 29 mm Medtronic Evolut THV.  Mean gradient 9 mmHg. Mild perivalvular leakage (difficult to visualize  with turbulent flow from mitral stenosis.   6. Aortic dilatation noted. There is mild dilatation of the ascending  aorta, measuring 37 mm.   7. The inferior vena cava is normal in size with greater than 50%  respiratory variability, suggesting right atrial pressure of 3 mmHg.   8. Possible PFO vs ASD noted on subcostal window.  Right/left cath 01/29/2021: Fluoroscopy demonstrated severe aortic valve calcification with minimal movement and heavily calcified mitral annulus. Right dominant coronary anatomy with widely patent normal coronary arteries. Severe calcific aortic stenosis.  (Greater than 70 mmHg peak gradient, but due to catheter instability/"ejection by LV", typical recordings could not be made).   LVEDP 19 mmHg. Moderate pulmonary with mean pressure 35 mmHg.  Pulmonary capillary wedge mean 22 mmHg.  Cardiac output 7.8 by Fick 7.4   RECOMMENDATIONS:   Referred to Dr. Cyndia Bent for surgical consultation as part of the multidisciplinary valve evaluation.    Karoline Caldwell, PA-C [facility]/Anesthesiology Phone (  [phone] 04/01/2022 10:25 AM

## 2022-04-01 NOTE — H&P (Deleted)
  The note originally documented on this encounter has been moved the the encounter in which it belongs.  

## 2022-04-02 ENCOUNTER — Ambulatory Visit (HOSPITAL_COMMUNITY)
Admission: RE | Admit: 2022-04-02 | Discharge: 2022-04-02 | Disposition: A | Discharge status: Home / Self Care | Payer: Medicare Other | Source: Ambulatory Visit | Attending: Obstetrics and Gynecology | Admitting: Obstetrics and Gynecology

## 2022-04-02 ENCOUNTER — Other Ambulatory Visit: Payer: Self-pay

## 2022-04-02 ENCOUNTER — Encounter (HOSPITAL_COMMUNITY)
Admission: RE | Disposition: A | Discharge status: Home / Self Care | Payer: Self-pay | Source: Ambulatory Visit | Attending: Obstetrics and Gynecology

## 2022-04-02 ENCOUNTER — Ambulatory Visit (HOSPITAL_BASED_OUTPATIENT_CLINIC_OR_DEPARTMENT_OTHER): Payer: Medicare Other | Admitting: Physician Assistant

## 2022-04-02 ENCOUNTER — Encounter (HOSPITAL_COMMUNITY): Payer: Self-pay | Admitting: Obstetrics and Gynecology

## 2022-04-02 ENCOUNTER — Ambulatory Visit (HOSPITAL_COMMUNITY): Payer: Medicare Other | Admitting: Physician Assistant

## 2022-04-02 DIAGNOSIS — Z6841 Body Mass Index (BMI) 40.0 and over, adult: Secondary | ICD-10-CM | POA: Insufficient documentation

## 2022-04-02 DIAGNOSIS — D270 Benign neoplasm of right ovary: Secondary | ICD-10-CM | POA: Insufficient documentation

## 2022-04-02 DIAGNOSIS — K219 Gastro-esophageal reflux disease without esophagitis: Secondary | ICD-10-CM | POA: Insufficient documentation

## 2022-04-02 DIAGNOSIS — D3912 Neoplasm of uncertain behavior of left ovary: Secondary | ICD-10-CM | POA: Diagnosis present

## 2022-04-02 DIAGNOSIS — I10 Essential (primary) hypertension: Secondary | ICD-10-CM | POA: Diagnosis not present

## 2022-04-02 DIAGNOSIS — D271 Benign neoplasm of left ovary: Secondary | ICD-10-CM | POA: Diagnosis not present

## 2022-04-02 DIAGNOSIS — Z87891 Personal history of nicotine dependence: Secondary | ICD-10-CM | POA: Insufficient documentation

## 2022-04-02 DIAGNOSIS — N838 Other noninflammatory disorders of ovary, fallopian tube and broad ligament: Secondary | ICD-10-CM

## 2022-04-02 HISTORY — PX: LAPAROSCOPIC BILATERAL SALPINGO OOPHERECTOMY: SHX5890

## 2022-04-02 LAB — BASIC METABOLIC PANEL
Anion gap: 12 (ref 5–15)
BUN: 22 mg/dL (ref 8–23)
CO2: 23 mmol/L (ref 22–32)
Calcium: 9.9 mg/dL (ref 8.9–10.3)
Chloride: 101 mmol/L (ref 98–111)
Creatinine, Ser: 0.83 mg/dL (ref 0.44–1.00)
GFR, Estimated: >60 mL/min (ref >60)
Glucose, Bld: 103 mg/dL — ABNORMAL HIGH (ref 70–99)
Potassium: 3.9 mmol/L (ref 3.5–5.1)
Sodium: 136 mmol/L (ref 135–145)

## 2022-04-02 LAB — CBC
HCT: 47 % — ABNORMAL HIGH (ref 36.0–46.0)
Hemoglobin: 15.9 g/dL — ABNORMAL HIGH (ref 12.0–15.0)
MCH: 32.1 pg (ref 26.0–34.0)
MCHC: 33.8 g/dL (ref 30.0–36.0)
MCV: 94.8 fL (ref 80.0–100.0)
Platelets: 260 10*3/uL (ref 150–400)
RBC: 4.96 MIL/uL (ref 3.87–5.11)
RDW: 13 % (ref 11.5–15.5)
WBC: 7.7 10*3/uL (ref 4.0–10.5)
nRBC: 0 % (ref 0.0–0.2)

## 2022-04-02 LAB — TYPE AND SCREEN
ABO/RH(D): O NEG
Antibody Screen: NEGATIVE

## 2022-04-02 SURGERY — SALPINGO-OOPHORECTOMY, BILATERAL, LAPAROSCOPIC
Anesthesia: General | Laterality: Bilateral

## 2022-04-02 MED ORDER — KETOROLAC TROMETHAMINE 30 MG/ML IJ SOLN
INTRAMUSCULAR | Status: AC
  Filled 2022-04-02: qty 1

## 2022-04-02 MED ORDER — PROPOFOL 10 MG/ML IV BOLUS
INTRAVENOUS | Status: AC
  Filled 2022-04-02: qty 20

## 2022-04-02 MED ORDER — DEXAMETHASONE SODIUM PHOSPHATE 10 MG/ML IJ SOLN
INTRAMUSCULAR | Status: DC | PRN
  Administered 2022-04-02: 10 mg via INTRAVENOUS

## 2022-04-02 MED ORDER — ONDANSETRON HCL 4 MG/2ML IJ SOLN
INTRAMUSCULAR | Status: AC
  Filled 2022-04-02: qty 2

## 2022-04-02 MED ORDER — BUPIVACAINE HCL (PF) 0.25 % IJ SOLN
INTRAMUSCULAR | Status: AC
  Filled 2022-04-02: qty 30

## 2022-04-02 MED ORDER — PROPOFOL 10 MG/ML IV BOLUS
INTRAVENOUS | Status: DC | PRN
  Administered 2022-04-02: 100 mg via INTRAVENOUS

## 2022-04-02 MED ORDER — BUPIVACAINE HCL (PF) 0.25 % IJ SOLN
INTRAMUSCULAR | Status: DC | PRN
  Administered 2022-04-02: 30 mL

## 2022-04-02 MED ORDER — CHLORHEXIDINE GLUCONATE 0.12 % MT SOLN
15.0000 mL | Freq: Once | OROMUCOSAL | Status: AC
  Administered 2022-04-02: 15 mL via OROMUCOSAL
  Filled 2022-04-02: qty 15

## 2022-04-02 MED ORDER — ACETAMINOPHEN 500 MG PO TABS
1000.0000 mg | ORAL_TABLET | ORAL | Status: DC
  Filled 2022-04-02: qty 2

## 2022-04-02 MED ORDER — 0.9 % SODIUM CHLORIDE (POUR BTL) OPTIME
TOPICAL | Status: DC | PRN
  Administered 2022-04-02: 1000 mL

## 2022-04-02 MED ORDER — HYDROMORPHONE HCL 1 MG/ML IJ SOLN: 0.2500 mg | INTRAMUSCULAR | Status: DC | PRN

## 2022-04-02 MED ORDER — SODIUM CHLORIDE 0.9 % IV SOLN
2.0000 g | INTRAVENOUS | Status: AC
  Administered 2022-04-02: 2 g via INTRAVENOUS
  Filled 2022-04-02: qty 2

## 2022-04-02 MED ORDER — MEPERIDINE HCL 25 MG/ML IJ SOLN: 6.2500 mg | INTRAMUSCULAR | Status: DC | PRN

## 2022-04-02 MED ORDER — OXYCODONE HCL 5 MG PO TABS: 5.0000 mg | ORAL_TABLET | Freq: Once | ORAL | Status: DC | PRN

## 2022-04-02 MED ORDER — EPHEDRINE SULFATE-NACL 50-0.9 MG/10ML-% IV SOSY
PREFILLED_SYRINGE | INTRAVENOUS | Status: DC | PRN
  Administered 2022-04-02: 10 mg via INTRAVENOUS
  Administered 2022-04-02: 15 mg via INTRAVENOUS

## 2022-04-02 MED ORDER — POVIDONE-IODINE 10 % EX SWAB
2.0000 "application " | Freq: Once | CUTANEOUS | Status: AC
  Administered 2022-04-02: 2 via TOPICAL

## 2022-04-02 MED ORDER — IBUPROFEN 800 MG PO TABS: 800.0000 mg | ORAL_TABLET | Freq: Three times a day (TID) | ORAL | 0 refills | Status: DC | PRN

## 2022-04-02 MED ORDER — ROCURONIUM BROMIDE 10 MG/ML (PF) SYRINGE
PREFILLED_SYRINGE | INTRAVENOUS | Status: AC
  Filled 2022-04-02: qty 10

## 2022-04-02 MED ORDER — LACTATED RINGERS IV SOLN: INTRAVENOUS | Status: DC

## 2022-04-02 MED ORDER — PHENYLEPHRINE HCL-NACL 20-0.9 MG/250ML-% IV SOLN
INTRAVENOUS | Status: DC | PRN
  Administered 2022-04-02: 25 ug/min via INTRAVENOUS

## 2022-04-02 MED ORDER — FENTANYL CITRATE (PF) 250 MCG/5ML IJ SOLN
INTRAMUSCULAR | Status: AC
  Filled 2022-04-02: qty 5

## 2022-04-02 MED ORDER — ROCURONIUM BROMIDE 10 MG/ML (PF) SYRINGE
PREFILLED_SYRINGE | INTRAVENOUS | Status: DC | PRN
  Administered 2022-04-02: 60 mg via INTRAVENOUS
  Administered 2022-04-02 (×2): 20 mg via INTRAVENOUS

## 2022-04-02 MED ORDER — DEXAMETHASONE SODIUM PHOSPHATE 10 MG/ML IJ SOLN
INTRAMUSCULAR | Status: AC
  Filled 2022-04-02: qty 1

## 2022-04-02 MED ORDER — ORAL CARE MOUTH RINSE
15.0000 mL | Freq: Once | OROMUCOSAL | Status: AC
Start: 2022-04-02 — End: 2022-04-02

## 2022-04-02 MED ORDER — OXYCODONE HCL 5 MG PO TABS: 5.0000 mg | ORAL_TABLET | Freq: Four times a day (QID) | ORAL | 0 refills | Status: DC | PRN

## 2022-04-02 MED ORDER — LIDOCAINE 2% (20 MG/ML) 5 ML SYRINGE
INTRAMUSCULAR | Status: DC | PRN
  Administered 2022-04-02: 30 mg via INTRAVENOUS

## 2022-04-02 MED ORDER — ONDANSETRON HCL 4 MG/2ML IJ SOLN
INTRAMUSCULAR | Status: DC | PRN
  Administered 2022-04-02: 4 mg via INTRAVENOUS

## 2022-04-02 MED ORDER — OXYCODONE HCL 5 MG/5ML PO SOLN: 5.0000 mg | Freq: Once | ORAL | Status: DC | PRN

## 2022-04-02 MED ORDER — KETOROLAC TROMETHAMINE 30 MG/ML IJ SOLN
INTRAMUSCULAR | Status: DC | PRN
  Administered 2022-04-02: 30 mg via INTRAVENOUS

## 2022-04-02 MED ORDER — LIDOCAINE 2% (20 MG/ML) 5 ML SYRINGE
INTRAMUSCULAR | Status: AC
  Filled 2022-04-02: qty 5

## 2022-04-02 MED ORDER — FENTANYL CITRATE (PF) 100 MCG/2ML IJ SOLN
INTRAMUSCULAR | Status: DC | PRN
  Administered 2022-04-02: 150 ug via INTRAVENOUS
  Administered 2022-04-02: 100 ug via INTRAVENOUS

## 2022-04-02 MED ORDER — HEMOSTATIC AGENTS (NO CHARGE) OPTIME
TOPICAL | Status: DC | PRN
  Administered 2022-04-02: 1 via TOPICAL

## 2022-04-02 MED ORDER — MIDAZOLAM HCL 2 MG/2ML IJ SOLN: 0.5000 mg | Freq: Once | INTRAMUSCULAR | Status: DC | PRN

## 2022-04-02 MED ORDER — SUGAMMADEX SODIUM 200 MG/2ML IV SOLN
INTRAVENOUS | Status: DC | PRN
  Administered 2022-04-02: 400 mg via INTRAVENOUS

## 2022-04-02 MED ORDER — LACTATED RINGERS IV SOLN: INTRAVENOUS | Status: DC | PRN

## 2022-04-02 SURGICAL SUPPLY — 42 items
APPLICATOR ARISTA FLEXITIP XL (MISCELLANEOUS) ×1 IMPLANT
APPLICATOR COTTON TIP 6 STRL (MISCELLANEOUS) IMPLANT
APPLICATOR COTTON TIP 6IN STRL (MISCELLANEOUS) ×6
BAG RETRIEVAL 10 (BASKET) ×1
CABLE HIGH FREQUENCY MONO STRZ (ELECTRODE) IMPLANT
CNTNR URN SCR LID CUP LEK RST (MISCELLANEOUS) IMPLANT
CONT SPEC 4OZ STRL OR WHT (MISCELLANEOUS) ×3
DERMABOND ADVANCED (GAUZE/BANDAGES/DRESSINGS) ×1
DERMABOND ADVANCED .7 DNX12 (GAUZE/BANDAGES/DRESSINGS) IMPLANT
DRSG OPSITE POSTOP 3X4 (GAUZE/BANDAGES/DRESSINGS) IMPLANT
GLOVE BIOGEL PI IND STRL 6.5 (GLOVE) ×1 IMPLANT
GLOVE BIOGEL PI IND STRL 7.0 (GLOVE) ×2 IMPLANT
GLOVE BIOGEL PI INDICATOR 6.5 (GLOVE) ×1
GLOVE BIOGEL PI INDICATOR 7.0 (GLOVE) ×2
GLOVE SURG ENC TEXT LTX SZ6.5 (GLOVE) ×4 IMPLANT
GOWN STRL REUS W/ TWL LRG LVL3 (GOWN DISPOSABLE) ×2 IMPLANT
GOWN STRL REUS W/TWL LRG LVL3 (GOWN DISPOSABLE) ×2
HEMOSTAT ARISTA ABSORB 3G PWDR (HEMOSTASIS) ×1 IMPLANT
IRRIG SUCT STRYKERFLOW 2 WTIP (MISCELLANEOUS)
IRRIGATION SUCT STRKRFLW 2 WTP (MISCELLANEOUS) IMPLANT
KIT TURNOVER KIT B (KITS) ×2 IMPLANT
NS IRRIG 1000ML POUR BTL (IV SOLUTION) ×2 IMPLANT
PACK LAPAROSCOPY BASIN (CUSTOM PROCEDURE TRAY) ×2 IMPLANT
PACK TRENDGUARD 450 HYBRID PRO (MISCELLANEOUS) IMPLANT
POUCH LAPAROSCOPIC INSTRUMENT (MISCELLANEOUS) ×2 IMPLANT
POUCH SPECIMEN RETRIEVAL 10MM (ENDOMECHANICALS) IMPLANT
PROTECTOR NERVE ULNAR (MISCELLANEOUS) ×4 IMPLANT
SET TUBE SMOKE EVAC HIGH FLOW (TUBING) ×2 IMPLANT
SHEARS HARMONIC ACE PLUS 36CM (ENDOMECHANICALS) IMPLANT
SLEEVE ENDOPATH XCEL 5M (ENDOMECHANICALS) ×2 IMPLANT
SOLUTION ELECTROLUBE (MISCELLANEOUS) IMPLANT
SUT VICRYL 0 UR6 27IN ABS (SUTURE) IMPLANT
SUT VICRYL 4-0 PS2 18IN ABS (SUTURE) ×2 IMPLANT
SYS BAG RETRIEVAL 10MM (BASKET) ×1
SYSTEM BAG RETRIEVAL 10MM (BASKET) IMPLANT
TOWEL GREEN STERILE FF (TOWEL DISPOSABLE) ×4 IMPLANT
TRAY FOLEY W/BAG SLVR 14FR (SET/KITS/TRAYS/PACK) ×2 IMPLANT
TRENDGUARD 450 HYBRID PRO PACK (MISCELLANEOUS)
TROCAR XCEL BLADELESS 5X75MML (TROCAR) ×1 IMPLANT
TROCAR XCEL NON-BLD 11X100MML (ENDOMECHANICALS) IMPLANT
TROCAR XCEL NON-BLD 5MMX100MML (ENDOMECHANICALS) ×2 IMPLANT
WARMER LAPAROSCOPE (MISCELLANEOUS) ×2 IMPLANT

## 2022-04-02 NOTE — Progress Notes (Signed)
Pt made aware at this time of OR delays. According to OR desk surgery will be able to start between 1800-1900. Pt voiced understanding and states she will update her husband. Pt comfortable.

## 2022-04-02 NOTE — Anesthesia Postprocedure Evaluation (Signed)
Anesthesia Post Note  Patient: Nikoletta Balkcom  Procedure(s) Performed: LAPAROSCOPIC BILATERAL SALPINGO OOPHORECTOMY (Bilateral)     Patient location during evaluation: PACU Anesthesia Type: General Level of consciousness: sedated Pain management: pain level controlled Vital Signs Assessment: post-procedure vital signs reviewed and stable Respiratory status: spontaneous breathing and respiratory function stable Cardiovascular status: stable Postop Assessment: no apparent nausea or vomiting Anesthetic complications: yes   Encounter Notable Events  Notable Event Outcome Phase Comment  Difficult to intubate - expected  Intraprocedure Filed from anesthesia note documentation.    Last Vitals:  Vitals:   04/02/22 2005 04/02/22 2020  BP: 140/60 (!) 153/66  Pulse: 73 74  Resp: 11 13  Temp:    SpO2: 95% 92%    Last Pain:  Vitals:   04/02/22 2005  TempSrc:   PainSc: 0-No pain                 Iraida Cragin DANIEL

## 2022-04-02 NOTE — Transfer of Care (Signed)
Immediate Anesthesia Transfer of Care Note  Patient: Katherine Waters  Procedure(s) Performed: LAPAROSCOPIC BILATERAL SALPINGO OOPHORECTOMY (Bilateral)  Patient Location: PACU  Anesthesia Type:General  Level of Consciousness: awake, alert  and oriented  Airway & Oxygen Therapy: Patient Spontanous Breathing and Patient connected to nasal cannula oxygen  Post-op Assessment: Report given to RN, Post -op Vital signs reviewed and stable and Patient moving all extremities  Post vital signs: Reviewed and stable  Last Vitals:  Vitals Value Taken Time  BP    Temp    Pulse 83 04/02/22 1938  Resp 11 04/02/22 1938  SpO2 96 % 04/02/22 1938  Vitals shown include unvalidated device data.  Last Pain:  Vitals:   04/02/22 1138  TempSrc:   PainSc: 0-No pain         Complications:  Encounter Notable Events  Notable Event Outcome Phase Comment  Difficult to intubate - expected  Intraprocedure Filed from anesthesia note documentation.

## 2022-04-02 NOTE — H&P (Signed)
Date of Initial H&P: 04/01/2022  History reviewed, patient examined, no change in status, stable for surgery.

## 2022-04-02 NOTE — Anesthesia Procedure Notes (Signed)
Procedure Name: Intubation Date/Time: 04/02/2022 5:54 PM  Performed by: Barrington Ellison, CRNAPre-anesthesia Checklist: Patient identified, Emergency Drugs available, Suction available and Patient being monitored Patient Re-evaluated:Patient Re-evaluated prior to induction Oxygen Delivery Method: Circle System Utilized Preoxygenation: Pre-oxygenation with 100% oxygen Induction Type: IV induction Ventilation: Mask ventilation without difficulty Laryngoscope Size: Glidescope and 3 Grade View: Grade I Tube type: Oral Tube size: 7.0 mm Number of attempts: 1 Airway Equipment and Method: Stylet and Oral airway Placement Confirmation: ETT inserted through vocal cords under direct vision, positive ETCO2 and breath sounds checked- equal and bilateral Secured at: 21 cm Tube secured with: Tape Dental Injury: Teeth and Oropharynx as per pre-operative assessment  Difficulty Due To: Difficulty was anticipated and Difficult Airway- due to limited oral opening Future Recommendations: Recommend- induction with short-acting agent, and alternative techniques readily available

## 2022-04-02 NOTE — Op Note (Signed)
04/02/2022  8:17 PM  PATIENT:  Katherine Waters  71 y.o. 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   DISPOSITION OF SPECIMEN:  PATHOLOGY  COUNTS:  YES  TOURNIQUET:  * No tourniquets in log *  DICTATION: .Note written in [place]: Discharge to home after PACU  PATIENT DISPOSITION:  PACU - hemodynamically stable.   Delay start of Pharmacological VTE agent (>24hrs) due to surgical blood loss or risk of bleeding: not applicable  Findings: Enlarged Left ovary containing complex cyst. Normal appearing fallopian tubes bilaterally and normal appearing right ovary.   Procedure:The patient was taken to the operating room #10 at [facility]. She was placed under general anesthesia. Time Out was performed.  She was  Prepped and draped in the normal sterile fashion. A foley catheter was placed. A sponge stick was placed into the vaginal vault.  Attention was turned to the abdomen where the umbilicus was injected with 10 cc of marcaine. A 10 mm trocar was placed under direct visualization. Pneumoperitoneum was achieved with C02 gas... A 5 mm trocar was placed in the right and left lower quadrants. Each trocar site was injected with 10 cc of marcaine prior to trocar placement.  Ureters were Identified bilaterally.  The harmonic scalpel was used transect the Left infundibulopelvic ligament followed by the right infundibulopelvic ligament. An endo catch bag was placed through the 10 mm umbilical port. The specimen was placed in the bag and removed through the umbilical incision. Pneumoperitoneum was reestablished. Arista was placed along the pelvic side walls at the site of the infundibulopelvic ligament transection. . Excellent hemostasis was noted. All trocars were removed under direct visualization . The pneumoperitoneum was released.  The fascia of the umbilical incision was re approximated with 0 vicryl. The skin incisions were closed with 4-0 vicryl and derma bond.  the patient was taken to the recovery room awake and in stable condition.  Sponge lap and needle counts were correct times 2.

## 2022-04-03 ENCOUNTER — Encounter (HOSPITAL_COMMUNITY): Payer: Self-pay | Admitting: Obstetrics and Gynecology

## 2022-04-03 LAB — CYTOLOGY - NON PAP

## 2022-04-04 LAB — SURGICAL PATHOLOGY

## 2022-04-14 ENCOUNTER — Ambulatory Visit (INDEPENDENT_AMBULATORY_CARE_PROVIDER_SITE_OTHER): Payer: Medicare Other | Admitting: Cardiovascular Disease

## 2022-04-14 ENCOUNTER — Encounter: Payer: Self-pay | Admitting: Cardiovascular Disease

## 2022-04-14 VITALS — BP 148/76 | HR 80 | Ht 62.0 in | Wt 246.4 lb

## 2022-04-14 DIAGNOSIS — I272 Pulmonary hypertension, unspecified: Secondary | ICD-10-CM | POA: Diagnosis not present

## 2022-04-14 DIAGNOSIS — R0602 Shortness of breath: Secondary | ICD-10-CM | POA: Diagnosis not present

## 2022-04-14 DIAGNOSIS — I342 Nonrheumatic mitral (valve) stenosis: Secondary | ICD-10-CM

## 2022-04-14 DIAGNOSIS — I10 Essential (primary) hypertension: Secondary | ICD-10-CM

## 2022-04-14 DIAGNOSIS — I5032 Chronic diastolic (congestive) heart failure: Secondary | ICD-10-CM | POA: Diagnosis not present

## 2022-04-14 DIAGNOSIS — Z952 Presence of prosthetic heart valve: Secondary | ICD-10-CM

## 2022-04-14 LAB — BASIC METABOLIC PANEL
BUN/Creatinine Ratio: 27 (ref 12–28)
BUN: 26 mg/dL (ref 8–27)
CO2: 27 mmol/L (ref 20–29)
Calcium: 10.1 mg/dL (ref 8.7–10.3)
Chloride: 100 mmol/L (ref 96–106)
Creatinine, Ser: 0.95 mg/dL (ref 0.57–1.00)
Glucose: 111 mg/dL — ABNORMAL HIGH (ref 70–99)
Potassium: 5 mmol/L (ref 3.5–5.2)
Sodium: 141 mmol/L (ref 134–144)
eGFR: 64 mL/min/{1.73_m2} (ref >59)

## 2022-04-14 MED ORDER — EMPAGLIFLOZIN 10 MG PO TABS: 10.0000 mg | ORAL_TABLET | Freq: Every day | ORAL | 11 refills | Status: DC

## 2022-04-14 MED ORDER — INDAPAMIDE 2.5 MG PO TABS: 5.0000 mg | ORAL_TABLET | Freq: Every morning | ORAL | 3 refills | Status: DC

## 2022-04-29 ENCOUNTER — Other Ambulatory Visit: Payer: Self-pay | Admitting: Physician Assistant

## 2022-05-05 ENCOUNTER — Other Ambulatory Visit: Payer: Self-pay | Admitting: Physician Assistant

## 2022-05-06 ENCOUNTER — Other Ambulatory Visit: Payer: Self-pay | Admitting: Obstetrics and Gynecology

## 2022-05-06 DIAGNOSIS — Z1231 Encounter for screening mammogram for malignant neoplasm of breast: Secondary | ICD-10-CM

## 2022-05-28 ENCOUNTER — Ambulatory Visit: Payer: Medicare Other

## 2022-06-18 ENCOUNTER — Ambulatory Visit
Admission: RE | Admit: 2022-06-18 | Discharge: 2022-06-18 | Disposition: A | Discharge status: Home / Self Care | Payer: Medicare Other | Source: Ambulatory Visit | Attending: Obstetrics and Gynecology | Admitting: Obstetrics and Gynecology

## 2022-06-18 DIAGNOSIS — Z1231 Encounter for screening mammogram for malignant neoplasm of breast: Secondary | ICD-10-CM

## 2022-07-02 ENCOUNTER — Other Ambulatory Visit: Payer: Self-pay | Admitting: Cardiovascular Disease

## 2022-09-24 ENCOUNTER — Ambulatory Visit: Payer: Medicare Other | Admitting: Cardiovascular Disease

## 2022-10-29 ENCOUNTER — Telehealth: Payer: Self-pay | Admitting: *Deleted

## 2022-10-29 ENCOUNTER — Telehealth: Payer: Self-pay

## 2022-10-29 NOTE — Telephone Encounter (Signed)
   Name: Katherine Waters  DOB: 1951-08-25  MRN: [id number]  Primary Cardiologist: Evalina Field, MD  Chart reviewed as part of pre-operative protocol coverage. Because of Katherine Waters's past medical history and time since last visit, she will require a follow-up telephone visit in order to better assess preoperative cardiovascular risk.  Pre-op covering staff: - Please schedule appointment and call patient to inform them. If patient already had an upcoming appointment within acceptable timeframe, please add "pre-op clearance" to the appointment notes so provider is aware. - Please contact requesting surgeon's office via preferred method (i.e, phone, fax) to inform them of need for appointment prior to surgery.  Okay to hold ASA x 7 days.  Patient with a past medical history of TAVR back in 2022.  Discussed with Dr. Audie Box.  Elgie Collard, PA-C  10/29/2022, 12:53 PM

## 2022-10-29 NOTE — Telephone Encounter (Signed)
   Pre-operative Risk Assessment    Patient Name: Katherine Waters  DOB: September 14, 1951 MRN: [id number]      Request for Surgical Clearance    Procedure:   Lt Shoulder Arthroscopy w/ RCR  Date of Surgery:  Clearance 12/23/22                                 Surgeon:  Gaynelle Arabian, MD Surgeon's Group or Practice Name:  [facility] Phone number:  [phone] Fax number:  [phone]   Type of Clearance Requested:   - Medical  - Pharmacy:  Hold Aspirin -not indicated   Type of Anesthesia:  General    Additional requests/questions:  Please advise surgeon/provider what medications should be held.  Signed, Ellwood Handler   9/76/7341, 12:30 PM

## 2022-10-29 NOTE — Telephone Encounter (Signed)
.    Patient Consent for Virtual Visit         Katherine Waters has provided verbal consent on 10/29/2022 for a virtual visit (video or telephone).   CONSENT FOR VIRTUAL VISIT FOR:  Katherine Waters  By participating in this virtual visit I agree to the following:  I hereby voluntarily request, consent and authorize [facility] and its employed or contracted physicians, physician assistants, nurse practitioners or other licensed health care professionals (the Practitioner), to provide me with telemedicine health care services (the "Services") as deemed necessary by the treating Practitioner. I acknowledge and consent to receive the Services by the Practitioner via telemedicine. I understand that the telemedicine visit will involve communicating with the Practitioner through live audiovisual communication technology and the disclosure of certain medical information by electronic transmission. I acknowledge that I have been given the opportunity to request an in-person assessment or other available alternative prior to the telemedicine visit and am voluntarily participating in the telemedicine visit.  I understand that I have the right to withhold or withdraw my consent to the use of telemedicine in the course of my care at any time, without affecting my right to future care or treatment, and that the Practitioner or I may terminate the telemedicine visit at any time. I understand that I have the right to inspect all information obtained and/or recorded in the course of the telemedicine visit and may receive copies of available information for a reasonable fee.  I understand that some of the potential risks of receiving the Services via telemedicine include:  Delay or interruption in medical evaluation due to technological equipment failure or disruption; Information transmitted may not be sufficient (e.g. poor resolution of images) to allow for appropriate medical decision making by the Practitioner;  and/or  In rare instances, security protocols could fail, causing a breach of personal health information.  Furthermore, I acknowledge that it is my responsibility to provide information about my medical history, conditions and care that is complete and accurate to the best of my ability. I acknowledge that Practitioner's advice, recommendations, and/or decision may be based on factors not within their control, such as incomplete or inaccurate data provided by me or distortions of diagnostic images or specimens that may result from electronic transmissions. I understand that the practice of medicine is not an exact science and that Practitioner makes no warranties or guarantees regarding treatment outcomes. I acknowledge that a copy of this consent can be made available to me via my patient portal ([facility]), or I can request a printed copy by calling the office of [facility].    I understand that my insurance will be billed for this visit.   I have read or had this consent read to me. I understand the contents of this consent, which adequately explains the benefits and risks of the Services being provided via telemedicine.  I have been provided ample opportunity to ask questions regarding this consent and the Services and have had my questions answered to my satisfaction. I give my informed consent for the services to be provided through the use of telemedicine in my medical care

## 2022-11-03 NOTE — Progress Notes (Unsigned)
Virtual Visit via Telephone Note   Because of Adreana Waters's co-morbid illnesses, she is at least at moderate risk for complications without adequate follow up.  This format is felt to be most appropriate for this patient at this time.  The patient did not have access to video technology/had technical difficulties with video requiring transitioning to audio format only (telephone).  All issues noted in this document were discussed and addressed.  No physical exam could be performed with this format.  Please refer to the patient's chart for her consent to telehealth for [facility].  Evaluation Performed:  Preoperative cardiovascular risk assessment _____________   Date:  11/03/2022   Patient ID:  Katherine Waters, Katherine Waters 1951/03/25, MRN [id number] Patient Location:  Home Provider location:   Office  Primary Care Provider:  Almedia Balls, NP Primary Cardiologist:  Evalina Field, MD  Chief Complaint / Patient Profile   72 y.o. y/o female with a h/o severe AS s/p TAVR, COPD, OSA, obesity, HFpEF  who is pending left shoulder arthroscopy and presents today for telephonic preoperative cardiovascular risk assessment.  History of Present Illness    Katherine Waters is a 72 y.o. female who presents via audio/video conferencing for a telehealth visit today.  Pt was last seen in cardiology clinic on 04/14/2022 by Dr. Audie Box.  At that time Taia Bramlett was doing well but weight was up 10 pounds and had some shortness of breath..  The patient is now pending procedure as outlined above. Since her last visit, she reports that she has not had any chest pain but still has similar shortness of breath as her previous visit with Dr. Audie Box in June.  She is able to complete ADLs but does so slowly with shortness of breath with heavy exertion.  She travels to her granddaughter's basketball games and is able to climb the bleachers at different game sites without any difficulty.  She denies chest pain, shortness of  breath, lower extremity edema, fatigue, palpitations, melena, hematuria, hemoptysis, diaphoresis, weakness, presyncope, syncope, orthopnea, and PND.   Okay to hold ASA x 7 days.  Patient with a past medical history of TAVR back in 2022.  Discussed with Dr. Audie Box.   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 Burt Knack and Dr. Roxy Manns    Severe aortic stenosis    Past Surgical History:  Procedure Laterality Date   ABDOMINAL HYSTERECTOMY     CARDIAC CATHETERIZATION     CATARACT EXTRACTION, BILATERAL     EYE SURGERY     INTRAOPERATIVE TRANSTHORACIC ECHOCARDIOGRAM Left 02/12/2021   Procedure: INTRAOPERATIVE TRANSTHORACIC ECHOCARDIOGRAM;  Surgeon: Sherren Mocha, MD;  Location: [facility];  Service: Open Heart Surgery;  Laterality: Left;   LAPAROSCOPIC BILATERAL SALPINGO OOPHERECTOMY Bilateral 04/02/2022   Procedure: LAPAROSCOPIC BILATERAL SALPINGO OOPHORECTOMY;  Surgeon: Christophe Louis, MD;  Location: [facility];  Service: Gynecology;  Laterality: Bilateral;   LUMBAR FUSION     POSTERIOR TIBIAL TENDON REPAIR     RIGHT/LEFT HEART CATH AND CORONARY ANGIOGRAPHY N/A  01/29/2021   Procedure: RIGHT/LEFT HEART CATH AND CORONARY ANGIOGRAPHY;  Surgeon: Belva Crome, MD;  Location: [facility];  Service: Cardiovascular;  Laterality: N/A;   ROTATOR CUFF REPAIR     TRANSCATHETER AORTIC VALVE REPLACEMENT, TRANSFEMORAL N/A 02/12/2021   Procedure: TRANSCATHETER AORTIC VALVE REPLACEMENT, TRANSFEMORAL;  Surgeon: Sherren Mocha, MD;  Location: [facility];  Service: Open Heart Surgery;  Laterality: N/A;   TUBAL LIGATION      ULTRASOUND GUIDANCE FOR VASCULAR ACCESS Bilateral 02/12/2021   Procedure: ULTRASOUND GUIDANCE FOR VASCULAR ACCESS;  Surgeon: Sherren Mocha, MD;  Location: [facility];  Service: Open Heart Surgery;  Laterality: Bilateral;    Allergies  Allergies  Allergen Reactions   Dobutamine     Decreases heart rate    Epinephrine Palpitations   Latex Rash   Sulfa Antibiotics Rash   Tape Rash    adhesive    Home Medications    Prior to Admission medications   Medication Sig Start Date End Date Taking? Authorizing Provider  acetaminophen (TYLENOL) 500 MG tablet Take 1,000 mg by mouth 2 (two) times daily.  may take an additional dose (1000 mg) as needed for pain    [provider]  amLODipine (NORVASC) 10 MG tablet Take 10 mg by mouth in the morning. 11/16/20   [provider]  amoxicillin (AMOXIL) 500 MG tablet TAKE 4 TABLETS (2,000 MG TOTAL) ONE HOUR PRIOR TO ALL DENTAL VISITS. 05/05/22   Eileen Stanford, PA-C  APPLE CIDER VINEGAR PO Take 1,600 mg by mouth daily.    [provider]  aspirin 81 MG chewable tablet Chew 1 tablet (81 mg total) by mouth daily. 02/14/21   Eileen Stanford, PA-C  Cholecalciferol (DIALYVITE VITAMIN D 5000) 125 MCG (5000 UT) capsule Take 5,000 Units by mouth in the morning.    [provider]  Cyanocobalamin (B-12) 1000 MCG SUBL Place 1,000 mcg under the tongue daily.    [provider]  DULoxetine (CYMBALTA) 60 MG capsule Take 60 mg by mouth in the morning.    [provider]  empagliflozin (JARDIANCE) 10 MG TABS tablet Take 1 tablet (10 mg total) by mouth daily before breakfast. 04/14/22   O'Neal, Cassie Freer, MD  indapamide (LOZOL) 2.5 MG tablet Take 2 tablets (5 mg total) by mouth in the morning. 04/14/22   O'Neal, Cassie Freer, MD  loratadine (CLARITIN) 10 MG tablet Take 10 mg by mouth daily.    [provider]  MAGNESIUM CITRATE PO Take 250 mg by mouth daily.    [provider]  Multiple  Vitamins-Minerals (HAIR/SKIN/NAILS/BIOTIN) TABS Take 2,500 mcg by mouth in the morning.    [provider]  Multiple Vitamins-Minerals (WOMENS MULTI GUMMIES PO) Take 2 tablets by mouth daily.    [provider]  omeprazole (PRILOSEC) 40 MG capsule Take 40 mg by mouth every morning. 02/12/22   [provider]  spironolactone (ALDACTONE) 25 MG tablet TAKE 1 TABLET (25 MG TOTAL) BY MOUTH DAILY. 07/02/22   O'NealCassie Freer, MD  telmisartan (MICARDIS) 80 MG tablet Take 80 mg by mouth in the morning. 10/20/20   [provider]  traMADol (ULTRAM) 50 MG tablet Take 50 mg by mouth every 4 (four) hours as needed for moderate pain. 10/22/22   [provider]  TURMERIC-GINGER PO Take 1,950 mg by mouth daily. W /bioperine    [provider]    Physical Exam    Vital Signs:  Chanel Mchan does not have vital signs  available for review today.  Given telephonic nature of communication, physical exam is limited. AAOx3. NAD. Normal affect.  Speech and respirations are unlabored.  Accessory Clinical Findings    None  Assessment & Plan    1.  Preoperative Cardiovascular Risk Assessment: -   Ms. Domino's perioperative risk of a major cardiac event is 6.6% according to the Revised Cardiac Risk Index (RCRI).  Therefore, she is at high risk for perioperative complications.   Her functional capacity is fair at 4.3 METs according to the Duke Activity Status Index (DASI). Recommendations: According to ACC/AHA guidelines, no further cardiovascular testing needed.  The patient may proceed to surgery at acceptable risk.   Antiplatelet and/or Anticoagulation Recommendations: Aspirin can be held for 5 days prior to her surgery.  Please resume Aspirin post operatively when it is felt to be safe from a bleeding standpoint.   The patient was advised that if she develops new symptoms prior to surgery to contact our office to arrange for a follow-up visit, and she  verbalized understanding.  A copy of this note will be routed to requesting surgeon.  Time:   Today, I have spent 9 minutes with the patient with telehealth technology discussing medical history, symptoms, and management plan.     Mable Fill, Marissa Nestle, NP  11/03/2022, 7:26 PM

## 2022-11-04 ENCOUNTER — Ambulatory Visit: Payer: Medicare Other | Attending: Cardiology | Admitting: Nurse Practitioner

## 2022-11-04 DIAGNOSIS — Z01810 Encounter for preprocedural cardiovascular examination: Secondary | ICD-10-CM

## 2022-11-17 ENCOUNTER — Other Ambulatory Visit: Payer: Self-pay | Admitting: Physician Assistant

## 2022-11-17 DIAGNOSIS — R1310 Dysphagia, unspecified: Secondary | ICD-10-CM

## 2022-12-01 ENCOUNTER — Ambulatory Visit
Admission: RE | Admit: 2022-12-01 | Discharge: 2022-12-01 | Disposition: A | Discharge status: Home / Self Care | Payer: Medicare Other | Source: Ambulatory Visit | Attending: Physician Assistant | Admitting: Physician Assistant

## 2022-12-01 DIAGNOSIS — R1310 Dysphagia, unspecified: Secondary | ICD-10-CM

## 2022-12-08 ENCOUNTER — Encounter (HOSPITAL_COMMUNITY): Payer: Self-pay | Admitting: Orthopedic Surgery

## 2022-12-08 ENCOUNTER — Other Ambulatory Visit: Payer: Self-pay

## 2022-12-08 NOTE — Progress Notes (Addendum)
Anesthesia Chart Review   Case: [id number] Date/Time: 12/09/22 1615   Procedures:      SHOULDER ARTHROSCOPY WITH DERIDEMENT AND  ROTATOR CUFF REPAIR (Left) - exparel block 120     SHOULDER ARTHROSCOPY WITH DISTAL CLAVICLE RESECTION AND SUBACROMIAL DECOMPRESSION (Left: Shoulder) - exparel block 120   Anesthesia type: General   Pre-op diagnosis: Left shoulder impingement, acromioclavicular osteoarthritis, rotator cuff tear   Location: [facility] ROOM 08 / [facility]   Surgeons: Justice Britain, MD       DISCUSSION:72 y.o. former smoker with h/o PONV, HTN, sleep apnea, s/p TAVR, Moderate Mitral stenosis on Echo 12/19/2021, left shoulder OA scheduled for above procedure 12/09/2022 with Dr. Justice Britain.   Pt seen by cardiology 11/04/2022 for preoperative evaluation.  Per OV note, "Ms. Mand's perioperative risk of a major cardiac event is 6.6% according to the Revised Cardiac Risk Index (RCRI).  Therefore, she is at high risk for perioperative complications.   Her functional capacity is fair at 4.3 METs according to the Duke Activity Status Index (DASI). Recommendations: According to ACC/AHA guidelines, no further cardiovascular testing needed.  The patient may proceed to surgery at acceptable risk.   Antiplatelet and/or Anticoagulation Recommendations: Aspirin can be held for 5 days prior to her surgery.  Please resume Aspirin post operatively when it is felt to be safe from a bleeding standpoint."  Per previous anesthesia notes, "Difficulty was anticipated and Difficult Airway- due to limited oral opening Future Recommendations: Recommend- induction with short-acting agent, and alternative techniques readily available Anticipate pt can proceed with planned procedure barring acute status change."   VS: Ht 5' 3"$  (1.6 m)   Wt 111.1 kg   BMI 43.40 kg/m   PROVIDERS: Almedia Balls, NP is PCP     LABS:  labs DOS, same day workup (all labs ordered are listed, but only abnormal results are  displayed)  Labs Reviewed - No data to display   IMAGES:   EKG:   CV: Echo 12/19/21  1. Left ventricular ejection fraction, by estimation, is 60 to 65%. The  left ventricle has normal function. The left ventricle has no regional  wall motion abnormalities. There is mild left ventricular hypertrophy.  Left ventricular diastolic parameters  are consistent with Grade I diastolic dysfunction (impaired relaxation).   2. Right ventricular systolic function is normal. The right ventricular  size is normal. Tricuspid regurgitation signal is inadequate for assessing  PA pressure.   3. Left atrial size was moderately dilated.   4. The mitral valve is degenerative. Mild mitral valve regurgitation.  Moderate to severe mitral stenosis. The mean mitral valve gradient is 8.0  mmHg with MVA by VTI 1.48 cm^2. Moderate mitral annular calcification.   5. Bioprosthetic aortic valve s/p TAVR with 29 mm Medtronic Evolut THV.  Mean gradient 9 mmHg. Mild perivalvular leakage (difficult to visualize  with turbulent flow from mitral stenosis.   6. Aortic dilatation noted. There is mild dilatation of the ascending  aorta, measuring 37 mm.   7. The inferior vena cava is normal in size with greater than 50%  respiratory variability, suggesting right atrial pressure of 3 mmHg.   8. Possible PFO vs ASD noted on subcostal window.  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 Burt Knack and Dr. Roxy Manns    Severe  aortic stenosis    Sleep apnea    Mild, CPAP wore for one year    Past Surgical History:  Procedure Laterality Date   ABDOMINAL HYSTERECTOMY     CARDIAC CATHETERIZATION     CATARACT EXTRACTION, BILATERAL     COLONOSCOPY WITH ESOPHAGOGASTRODUODENOSCOPY (EGD) AND ESOPHAGEAL DILATION (ED)     2016   INTRAOPERATIVE TRANSTHORACIC ECHOCARDIOGRAM Left 02/12/2021   Procedure: INTRAOPERATIVE TRANSTHORACIC ECHOCARDIOGRAM;  Surgeon: Sherren Mocha, MD;  Location: [facility];  Service: Open Heart Surgery;  Laterality: Left;   LAPAROSCOPIC BILATERAL SALPINGO OOPHERECTOMY Bilateral 04/02/2022   Procedure: LAPAROSCOPIC BILATERAL SALPINGO OOPHORECTOMY;  Surgeon: Christophe Louis, MD;  Location: [facility];  Service: Gynecology;  Laterality: Bilateral;   LUMBAR FUSION  2013   POSTERIOR TIBIAL TENDON REPAIR Left 2019   RIGHT/LEFT HEART CATH AND CORONARY ANGIOGRAPHY N/A 01/29/2021   Procedure: RIGHT/LEFT HEART CATH AND CORONARY ANGIOGRAPHY;  Surgeon: Belva Crome, MD;  Location: [facility];  Service: Cardiovascular;  Laterality: N/A;   ROTATOR CUFF REPAIR Right    TRANSCATHETER AORTIC VALVE REPLACEMENT, TRANSFEMORAL N/A 02/12/2021   Procedure: TRANSCATHETER AORTIC VALVE REPLACEMENT, TRANSFEMORAL;  Surgeon: Sherren Mocha, MD;  Location: [place];  Service: Open Heart Surgery;  Laterality: N/A;   TUBAL LIGATION     ULTRASOUND GUIDANCE FOR VASCULAR ACCESS Bilateral 02/12/2021   Procedure: ULTRASOUND GUIDANCE FOR VASCULAR ACCESS;  Surgeon: Sherren Mocha, MD;  Location: [place];  Service: Open Heart Surgery;  Laterality: Bilateral;   WISDOM TOOTH EXTRACTION  1973    MEDICATIONS: No current facility-administered medications for this encounter.    acetaminophen (TYLENOL) 500 MG tablet   amLODipine (NORVASC) 10 MG tablet   amoxicillin (AMOXIL) 500 MG tablet   APPLE CIDER VINEGAR PO   aspirin 81 MG chewable tablet   Cholecalciferol (DIALYVITE VITAMIN D 5000) 125 MCG (5000 UT) capsule   Cyanocobalamin (B-12) 1000  MCG SUBL   DULoxetine (CYMBALTA) 60 MG capsule   empagliflozin (JARDIANCE) 10 MG TABS tablet   indapamide (LOZOL) 2.5 MG tablet   loratadine (CLARITIN) 10 MG tablet   MAGNESIUM GLUCONATE PO   Multiple Vitamins-Minerals (HAIR/SKIN/NAILS/BIOTIN) TABS   omeprazole (PRILOSEC) 40 MG capsule   pyridOXINE (VITAMIN B6) 100 MG tablet   spironolactone (ALDACTONE) 25 MG tablet   telmisartan (MICARDIS) 80 MG tablet   traMADol (ULTRAM) 50 MG tablet   TURMERIC-GINGER PO    Colandra Ohanian Ward, PA-C [facility]-Surgical Testing [phone]

## 2022-12-08 NOTE — Progress Notes (Signed)
For Anesthesia: PCP - Almedia Balls, NP  Cardiologist - Geralynn Rile, MD Pulmonologist- N/A  Chest x-ray - greater than 1 year in [facility] EKG - 04/02/22 in [facility] Stress Test - greater than 2 years  ECHO - 12/19/21 in [facility] Cardiac Cath - 01/29/21 Pacemaker/ICD device last checked: N/A Pacemaker orders received: N/A Device Rep notified: N/A  Spinal Cord Stimulator: N/A  Sleep Study - Yes CPAP - wore for one year not currently using  Fasting Blood Sugar - N/A Checks Blood Sugar ___N/A__ times a day Date and result of last Hgb A1c-N/A  Last dose of GLP1 agonist- N/A GLP1 instructions: N/A  Last dose of SGLT-2 inhibitors- Jardiance SGLT-2 instructions: Hold day of surgery  Blood Thinner Instructions: N/A Aspirin Instructions: Yes Last Dose: 12/08/22 at 8:30 AM  Activity level: Shortness of breath with activity  Anesthesia review: History of Severe Aortic Stenosis, S/p TAVR, HTN, Moderate to severe MS,   Patient denies shortness of breath, fever, cough and chest pain at PAT appointment   Patient verbalized understanding of instructions reviewed via telephone.

## 2022-12-08 NOTE — Anesthesia Preprocedure Evaluation (Addendum)
Anesthesia Evaluation  Patient identified by MRN, date of birth, ID band Patient awake    Reviewed: Allergy & Precautions, NPO status , Patient's Chart, lab work & pertinent test results  History of Anesthesia Complications (+) PONV and history of anesthetic complications  Airway Mallampati: III     Mouth opening: Limited Mouth Opening  Dental no notable dental hx. (+) Dental Advisory Given, Caps   Pulmonary shortness of breath and with exertion, asthma , sleep apnea , former smoker   Pulmonary exam normal breath sounds clear to auscultation       Cardiovascular hypertension, Pt. 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  Per OV note, "Ms. Salek's perioperative risk of a major cardiac event is 6.6% according to the Revised Cardiac Risk Index (RCRI).  Therefore, she is at high risk for perioperative complications.   Her functional capacity is fair at 4.3 METs according to the Duke Activity Status Index (DASI). Recommendations: According to ACC/AHA guidelines, no further cardiovascular testing needed.  The patient may proceed to surgery at acceptable  risk.   Antiplatelet and/or Anticoagulation Recommendations: Aspirin can be held for 5 days prior to her surgery.  Please resume Aspirin post operatively when it is felt to be safe from a bleeding standpoint."   Reproductive/Obstetrics Neoplasm ovary                             Anesthesia Physical Anesthesia Plan  ASA: 3  Anesthesia Plan: General   Post-op Pain Management: Tylenol PO (pre-op)*, Regional block* and Minimal or no pain anticipated   Induction:   PONV Risk Score and Plan: 4 or greater and Ondansetron, Dexamethasone, Treatment may vary due to age or medical condition and Diphenhydramine  Airway Management Planned: Oral ETT and Video Laryngoscope Planned  Additional Equipment: ClearSight  Intra-op Plan:   Post-operative Plan: Extubation in OR  Informed Consent: I have reviewed the patients History and Physical, chart, labs and discussed the procedure including the risks, benefits and alternatives for the proposed anesthesia with the patient or authorized representative who has indicated his/her understanding and acceptance.     Dental advisory given  Plan Discussed with: Anesthesiologist and CRNA  Anesthesia Plan Comments: (See PAT note 12/08/2022)        Anesthesia Quick Evaluation

## 2022-12-09 ENCOUNTER — Ambulatory Visit (HOSPITAL_BASED_OUTPATIENT_CLINIC_OR_DEPARTMENT_OTHER): Payer: Medicare Other | Admitting: Physician Assistant

## 2022-12-09 ENCOUNTER — Ambulatory Visit (HOSPITAL_COMMUNITY): Payer: Medicare Other | Admitting: Physician Assistant

## 2022-12-09 ENCOUNTER — Encounter (HOSPITAL_COMMUNITY): Payer: Self-pay | Admitting: Orthopedic Surgery

## 2022-12-09 ENCOUNTER — Ambulatory Visit (HOSPITAL_COMMUNITY)
Admission: RE | Admit: 2022-12-09 | Discharge: 2022-12-09 | Disposition: A | Discharge status: Home / Self Care | Payer: Medicare Other | Source: Ambulatory Visit | Attending: Orthopedic Surgery | Admitting: Orthopedic Surgery

## 2022-12-09 ENCOUNTER — Encounter (HOSPITAL_COMMUNITY)
Admission: RE | Disposition: A | Discharge status: Home / Self Care | Payer: Self-pay | Source: Ambulatory Visit | Attending: Orthopedic Surgery

## 2022-12-09 ENCOUNTER — Other Ambulatory Visit: Payer: Self-pay

## 2022-12-09 DIAGNOSIS — M797 Fibromyalgia: Secondary | ICD-10-CM | POA: Insufficient documentation

## 2022-12-09 DIAGNOSIS — M19012 Primary osteoarthritis, left shoulder: Secondary | ICD-10-CM | POA: Insufficient documentation

## 2022-12-09 DIAGNOSIS — G4730 Sleep apnea, unspecified: Secondary | ICD-10-CM | POA: Insufficient documentation

## 2022-12-09 DIAGNOSIS — Z6841 Body Mass Index (BMI) 40.0 and over, adult: Secondary | ICD-10-CM | POA: Diagnosis not present

## 2022-12-09 DIAGNOSIS — Z87891 Personal history of nicotine dependence: Secondary | ICD-10-CM | POA: Diagnosis not present

## 2022-12-09 DIAGNOSIS — M341 CR(E)ST syndrome: Secondary | ICD-10-CM | POA: Diagnosis not present

## 2022-12-09 DIAGNOSIS — M7542 Impingement syndrome of left shoulder: Secondary | ICD-10-CM | POA: Insufficient documentation

## 2022-12-09 DIAGNOSIS — I7300 Raynaud's syndrome without gangrene: Secondary | ICD-10-CM | POA: Insufficient documentation

## 2022-12-09 DIAGNOSIS — I10 Essential (primary) hypertension: Secondary | ICD-10-CM | POA: Insufficient documentation

## 2022-12-09 DIAGNOSIS — F32A Depression, unspecified: Secondary | ICD-10-CM | POA: Diagnosis not present

## 2022-12-09 DIAGNOSIS — Z952 Presence of prosthetic heart valve: Secondary | ICD-10-CM | POA: Insufficient documentation

## 2022-12-09 DIAGNOSIS — J45909 Unspecified asthma, uncomplicated: Secondary | ICD-10-CM

## 2022-12-09 DIAGNOSIS — Z79899 Other long term (current) drug therapy: Secondary | ICD-10-CM | POA: Insufficient documentation

## 2022-12-09 DIAGNOSIS — Q631 Lobulated, fused and horseshoe kidney: Secondary | ICD-10-CM | POA: Diagnosis not present

## 2022-12-09 DIAGNOSIS — Z9889 Other specified postprocedural states: Secondary | ICD-10-CM

## 2022-12-09 DIAGNOSIS — K219 Gastro-esophageal reflux disease without esophagitis: Secondary | ICD-10-CM | POA: Insufficient documentation

## 2022-12-09 DIAGNOSIS — M75102 Unspecified rotator cuff tear or rupture of left shoulder, not specified as traumatic: Secondary | ICD-10-CM | POA: Diagnosis not present

## 2022-12-09 HISTORY — DX: Dyspnea, unspecified: R06.00

## 2022-12-09 HISTORY — DX: Personal history of other diseases of the nervous system and sense organs: Z86.69

## 2022-12-09 HISTORY — DX: Cr(e)st syndrome: M34.1

## 2022-12-09 HISTORY — DX: Unspecified osteoarthritis, unspecified site: M19.90

## 2022-12-09 HISTORY — DX: Sleep apnea, unspecified: G47.30

## 2022-12-09 HISTORY — DX: Other specified arthritis, unspecified site: M13.80

## 2022-12-09 HISTORY — DX: Lobulated, fused and horseshoe kidney: Q63.1

## 2022-12-09 HISTORY — PX: SHOULDER ARTHROSCOPY WITH ROTATOR CUFF REPAIR: SHX5685

## 2022-12-09 HISTORY — PX: SHOULDER ARTHROSCOPY WITH DISTAL CLAVICLE RESECTION: SHX5675

## 2022-12-09 LAB — BASIC METABOLIC PANEL
Anion gap: 14 (ref 5–15)
BUN: 21 mg/dL (ref 8–23)
CO2: 24 mmol/L (ref 22–32)
Calcium: 9.5 mg/dL (ref 8.9–10.3)
Chloride: 97 mmol/L — ABNORMAL LOW (ref 98–111)
Creatinine, Ser: 0.96 mg/dL (ref 0.44–1.00)
GFR, Estimated: >60 mL/min (ref >60)
Glucose, Bld: 102 mg/dL — ABNORMAL HIGH (ref 70–99)
Potassium: 3.7 mmol/L (ref 3.5–5.1)
Sodium: 135 mmol/L (ref 135–145)

## 2022-12-09 LAB — CBC
HCT: 45.9 % (ref 36.0–46.0)
Hemoglobin: 15.4 g/dL — ABNORMAL HIGH (ref 12.0–15.0)
MCH: 31.4 pg (ref 26.0–34.0)
MCHC: 33.6 g/dL (ref 30.0–36.0)
MCV: 93.7 fL (ref 80.0–100.0)
Platelets: 264 10*3/uL (ref 150–400)
RBC: 4.9 MIL/uL (ref 3.87–5.11)
RDW: 13.2 % (ref 11.5–15.5)
WBC: 8.9 10*3/uL (ref 4.0–10.5)
nRBC: 0 % (ref 0.0–0.2)

## 2022-12-09 SURGERY — ARTHROSCOPY, SHOULDER, WITH ROTATOR CUFF REPAIR
Anesthesia: General | Site: Shoulder | Laterality: Left

## 2022-12-09 MED ORDER — SODIUM CHLORIDE 0.9 % IR SOLN
Status: DC | PRN
  Administered 2022-12-09: 12000 mL

## 2022-12-09 MED ORDER — BUPIVACAINE LIPOSOME 1.3 % IJ SUSP
INTRAMUSCULAR | Status: DC | PRN
  Administered 2022-12-09: 10 mL via PERINEURAL

## 2022-12-09 MED ORDER — PHENYLEPHRINE HCL-NACL 20-0.9 MG/250ML-% IV SOLN
INTRAVENOUS | Status: DC | PRN
  Administered 2022-12-09: 30 ug/min via INTRAVENOUS

## 2022-12-09 MED ORDER — ROCURONIUM BROMIDE 50 MG/5ML IV SOSY
PREFILLED_SYRINGE | INTRAVENOUS | Status: DC | PRN
  Administered 2022-12-09: 60 mg via INTRAVENOUS

## 2022-12-09 MED ORDER — FENTANYL CITRATE PF 50 MCG/ML IJ SOSY
50.0000 ug | PREFILLED_SYRINGE | Freq: Once | INTRAMUSCULAR | Status: AC
  Administered 2022-12-09: 50 ug via INTRAVENOUS
  Filled 2022-12-09: qty 2

## 2022-12-09 MED ORDER — SUGAMMADEX SODIUM 500 MG/5ML IV SOLN
INTRAVENOUS | Status: AC
  Filled 2022-12-09: qty 5

## 2022-12-09 MED ORDER — MIDAZOLAM HCL 2 MG/2ML IJ SOLN
1.0000 mg | Freq: Once | INTRAMUSCULAR | Status: DC
  Filled 2022-12-09: qty 2

## 2022-12-09 MED ORDER — LIDOCAINE 2% (20 MG/ML) 5 ML SYRINGE
INTRAMUSCULAR | Status: DC | PRN
  Administered 2022-12-09: 100 mg via INTRAVENOUS

## 2022-12-09 MED ORDER — ONDANSETRON HCL 4 MG/2ML IJ SOLN
INTRAMUSCULAR | Status: DC | PRN
  Administered 2022-12-09: 4 mg via INTRAVENOUS

## 2022-12-09 MED ORDER — FENTANYL CITRATE (PF) 100 MCG/2ML IJ SOLN
INTRAMUSCULAR | Status: AC
  Filled 2022-12-09: qty 2

## 2022-12-09 MED ORDER — DEXAMETHASONE SODIUM PHOSPHATE 10 MG/ML IJ SOLN
INTRAMUSCULAR | Status: DC | PRN
  Administered 2022-12-09: 10 mg via INTRAVENOUS

## 2022-12-09 MED ORDER — ACETAMINOPHEN 500 MG PO TABS
1000.0000 mg | ORAL_TABLET | Freq: Once | ORAL | Status: AC
  Administered 2022-12-09: 1000 mg via ORAL
  Filled 2022-12-09: qty 2

## 2022-12-09 MED ORDER — FENTANYL CITRATE (PF) 100 MCG/2ML IJ SOLN
INTRAMUSCULAR | Status: DC | PRN
  Administered 2022-12-09: 100 ug via INTRAVENOUS

## 2022-12-09 MED ORDER — FENTANYL CITRATE PF 50 MCG/ML IJ SOSY: 25.0000 ug | PREFILLED_SYRINGE | INTRAMUSCULAR | Status: DC | PRN

## 2022-12-09 MED ORDER — BUPIVACAINE HCL (PF) 0.5 % IJ SOLN
INTRAMUSCULAR | Status: DC | PRN
  Administered 2022-12-09: 15 mL via PERINEURAL

## 2022-12-09 MED ORDER — CEFAZOLIN SODIUM-DEXTROSE 2-4 GM/100ML-% IV SOLN
2.0000 g | INTRAVENOUS | Status: AC
  Administered 2022-12-09: 2 g via INTRAVENOUS
  Filled 2022-12-09: qty 100

## 2022-12-09 MED ORDER — SUGAMMADEX SODIUM 500 MG/5ML IV SOLN
INTRAVENOUS | Status: DC | PRN
  Administered 2022-12-09: 222.2 mg via INTRAVENOUS

## 2022-12-09 MED ORDER — LIDOCAINE HCL (PF) 2 % IJ SOLN
INTRAMUSCULAR | Status: AC
  Filled 2022-12-09: qty 5

## 2022-12-09 MED ORDER — PROPOFOL 10 MG/ML IV BOLUS
INTRAVENOUS | Status: DC | PRN
  Administered 2022-12-09: 150 mg via INTRAVENOUS

## 2022-12-09 MED ORDER — PROMETHAZINE HCL 25 MG/ML IJ SOLN: 6.2500 mg | INTRAMUSCULAR | Status: DC | PRN

## 2022-12-09 MED ORDER — AMISULPRIDE (ANTIEMETIC) 5 MG/2ML IV SOLN: 10.0000 mg | Freq: Once | INTRAVENOUS | Status: DC | PRN

## 2022-12-09 MED ORDER — ORAL CARE MOUTH RINSE: 15.0000 mL | Freq: Once | OROMUCOSAL | Status: AC

## 2022-12-09 MED ORDER — CHLORHEXIDINE GLUCONATE 0.12 % MT SOLN
15.0000 mL | Freq: Once | OROMUCOSAL | Status: AC
  Administered 2022-12-09: 15 mL via OROMUCOSAL

## 2022-12-09 MED ORDER — LACTATED RINGERS IV SOLN: INTRAVENOUS | Status: DC

## 2022-12-09 SURGICAL SUPPLY — 57 items
ANCHOR PEEK 4.75X19.1 SWLK C (Anchor) IMPLANT
BAG COUNTER SPONGE SURGICOUNT (BAG) ×2 IMPLANT
BLADE EXCALIBUR 4.0X13 (MISCELLANEOUS) ×2 IMPLANT
BOOTIES KNEE HIGH SLOAN (MISCELLANEOUS) ×4 IMPLANT
BURR OVAL 8 FLU 4.0X13 (MISCELLANEOUS) ×2 IMPLANT
CANNULA ACUFLEX KIT 5X76 (CANNULA) ×2 IMPLANT
CANNULA DRILOCK 5.0X75 (CANNULA) IMPLANT
CANNULA TWIST IN 8.25X7CM (CANNULA) IMPLANT
CONNECTOR 5 IN 1 STRAIGHT STRL (MISCELLANEOUS) ×2 IMPLANT
COOLER ICEMAN CLASSIC (MISCELLANEOUS) IMPLANT
DISSECTOR  3.8MM X 13CM (MISCELLANEOUS) ×2
DISSECTOR 3.8MM X 13CM (MISCELLANEOUS) ×2 IMPLANT
DRAPE INCISE 23X17 IOBAN STRL (DRAPES) ×2
DRAPE INCISE 23X17 STRL (DRAPES) ×2 IMPLANT
DRAPE INCISE IOBAN 23X17 STRL (DRAPES) ×2 IMPLANT
DRAPE INCISE IOBAN 66X45 STRL (DRAPES) ×2 IMPLANT
DRAPE ORTHO SPLIT 77X108 STRL (DRAPES)
DRAPE STERI 35X30 U-POUCH (DRAPES) ×2 IMPLANT
DRAPE SURG 17X11 SM STRL (DRAPES) ×2 IMPLANT
DRAPE SURG ORHT 6 SPLT 77X108 (DRAPES) IMPLANT
DRAPE U-SHAPE 47X51 STRL (DRAPES) ×2 IMPLANT
DURAPREP 26ML APPLICATOR (WOUND CARE) ×2 IMPLANT
GAUZE PAD ABD 7.5X8 STRL (GAUZE/BANDAGES/DRESSINGS) IMPLANT
GAUZE PAD ABD 8X10 STRL (GAUZE/BANDAGES/DRESSINGS) ×4 IMPLANT
GAUZE SPONGE 4X4 12PLY STRL (GAUZE/BANDAGES/DRESSINGS) ×2 IMPLANT
GLOVE BIO SURGEON STRL SZ7.5 (GLOVE) ×2 IMPLANT
GLOVE BIO SURGEON STRL SZ8 (GLOVE) ×2 IMPLANT
GLOVE SS BIOGEL STRL SZ 7 (GLOVE) ×6 IMPLANT
GLOVE SS BIOGEL STRL SZ 7.5 (GLOVE) ×2 IMPLANT
GOWN STRL SURGICAL XL XLNG (GOWN DISPOSABLE) ×4 IMPLANT
KIT BASIN OR (CUSTOM PROCEDURE TRAY) ×2 IMPLANT
KIT SHOULDER TRACTION (DRAPES) ×2 IMPLANT
KIT TURNOVER KIT A (KITS) IMPLANT
MANIFOLD NEPTUNE II (INSTRUMENTS) ×2 IMPLANT
NDL HD SCORPION MEGA LOADER (NEEDLE) IMPLANT
NDL SCORPION MULTI FIRE (NEEDLE) IMPLANT
NEEDLE SCORPION MULTI FIRE (NEEDLE) IMPLANT
NS IRRIG 1000ML POUR BTL (IV SOLUTION) ×2 IMPLANT
PACK ARTHROSCOPY WL (CUSTOM PROCEDURE TRAY) ×2 IMPLANT
PAD ARMBOARD 7.5X6 YLW CONV (MISCELLANEOUS) ×2 IMPLANT
PAD COLD SHLDR WRAP-ON (PAD) IMPLANT
PROBE APOLLO 90XL (SURGICAL WAND) ×2 IMPLANT
SLING ARM FOAM STRAP MED (SOFTGOODS) IMPLANT
SLING ULTRA III MED (ORTHOPEDIC SUPPLIES) IMPLANT
SPONGE T-LAP 4X18 ~~LOC~~+RFID (SPONGE) ×2 IMPLANT
STRIP CLOSURE SKIN 1/2X4 (GAUZE/BANDAGES/DRESSINGS) ×2 IMPLANT
SUT FIBERWIRE #2 38 T-5 BLUE (SUTURE)
SUT MNCRL AB 3-0 PS2 18 (SUTURE) ×2 IMPLANT
SUT PDS AB 0 CT 36 (SUTURE) IMPLANT
SUT TIGER TAPE 7 IN WHITE (SUTURE) ×2 IMPLANT
SUTURE FIBERWR #2 38 T-5 BLUE (SUTURE) IMPLANT
TAPE FIBER 2MM 7IN #2 BLUE (SUTURE) IMPLANT
TAPE PAPER 3X10 WHT MICROPORE (GAUZE/BANDAGES/DRESSINGS) ×2 IMPLANT
TOWEL OR 17X26 10 PK STRL BLUE (TOWEL DISPOSABLE) ×2 IMPLANT
TUBING ARTHROSCOPY IRRIG 16FT (MISCELLANEOUS) ×2 IMPLANT
WATER STERILE IRR 1000ML POUR (IV SOLUTION) ×2 IMPLANT
YANKAUER SUCT BULB TIP 10FT TU (MISCELLANEOUS) ×2 IMPLANT

## 2022-12-09 NOTE — Anesthesia Procedure Notes (Signed)
Anesthesia Regional Block: Interscalene brachial plexus block   Pre-Anesthetic Checklist: , timeout performed,  Correct Patient, Correct Site, Correct Laterality,  Correct Procedure, Correct Position, site marked,  Risks and benefits discussed,  Surgical consent,  Pre-op evaluation,  At surgeon's request and post-op pain management  Laterality: Left  Prep: chloraprep       Needles:  Injection technique: Single-shot  Needle Type: Echogenic Stimulator Needle     Needle Length: 10cm  Needle Gauge: 21   Needle insertion depth: 7 cm   Additional Needles:   Procedures:,,,, ultrasound used (permanent image in chart),,   Motor weakness within 5 minutes.  Narrative:  Start time: 12/09/2022 4:19 PM End time: 12/09/2022 4:24 PM Injection made incrementally with aspirations every 5 mL.  Performed by: Personally  Anesthesiologist: Josephine Igo, MD  Additional Notes: Timeout performed. Patient sedated. Relevant anatomy ID'd using [place]. Incremental 2-6m injection of LA with frequent aspiration. Patient tolerated procedure well.

## 2022-12-09 NOTE — Op Note (Signed)
12/09/2022  6:48 PM  PATIENT:   Katherine Waters  72 y.o. female  PRE-OPERATIVE DIAGNOSIS:  Left shoulder impingement, acromioclavicular osteoarthritis, rotator cuff tear  POST-OPERATIVE DIAGNOSIS: Same with additional operative finding of extensive degenerative labral tear  PROCEDURE:  1.  Left shoulder examination under anesthesia  2.  Left shoulder glenohumeral joint diagnostic arthroscopy  3.  Debridement of extensive degenerative labral tear  4.  Arthroscopic subacromial decompression and bursectomy  5.  Arthroscopic distal clavicle resection  6.  Arthroscopic rotator cuff repair using a double row suture repair construct  SURGEON:  Darcella Shiffman, Metta Clines. M.D.  ASSISTANTS: Jenetta Loges, PA-C  Jenetta Loges, PA-C was utilized as an Environmental consultant throughout this case, essential for help with positioning the patient, positioning extremity, tissue manipulation, implantation of the prosthesis, suture management, wound closure, and intraoperative decision-making.  ANESTHESIA:   General endotracheal and interscalene block with Exparel  EBL: Minimal  SPECIMEN: None  Drains: None   PATIENT DISPOSITION:  PACU - hemodynamically stable.    PLAN OF CARE: Discharge to home after PACU  Brief history:  Patient is a 72 year old female with chronic and progressively increasing left shoulder pain related to impingement syndrome and symptomatic AC joint arthritis with recent MRI scan confirming a full-thickness rotator cuff tear.  Due to her ongoing pain and functional limitations and failure to respond to prolonged attempts at conservative management, she is brought to the operating room at this time for planned left shoulder arthroscopy as described below.  Preoperatively the patient was counseled regarding treatment options as well as the potential risks versus benefits there.  Possible surgical complications were reviewed including potential for bleeding, infection, neurovascular injury,  persistent pain, loss of motion, recurrence of rotator cuff tear, anesthetic complication, and possible need for additional surgery.  She understands, and accepts, and agrees with the planned procedure.  Procedure detail:  After undergoing routine preop evaluation the patient received prophylactic antibiotics and interscalene block with Exparel was established in the holding area by the anesthesia department.  Subsequently placed spine on the operating table and underwent the smooth induction with general endotracheal anesthesia.  Turned to the right lateral decubitus position on the beanbag and appropriately padded and protected.  Left shoulder examination under anesthesia revealed full motion and no instability patterns were noted.  Left arm was then suspended at 70 degrees of abduction with 15 pounds of traction and left shoulder girdle region was sterilely prepped and draped in standard fashion.  Timeout was called.  A posterior portal was established of the glenohumeral joint and an anterior portal established under direct visualization.  Articular surfaces were found to be in good condition.  There was extensive synovitis and limited synovectomy was performed.  There was extensive degenerative labral tearing anteriorly and superiorly and this was debrided back to stable margin with a shaver.  The biceps tendon shows some proximal tenosynovitis and a limited debridement was performed.  The anchor was stable and the biceps tendon had normal caliber with no evidence for distal instability.  Rotator cuff showed no obvious full-thickness defect of the distal supraspinatus and this was debrided from the articular side using the shaver.  The balance of the glenoid joint showed no obvious additional pathologies.  Fluid and insulin were then removed.  The arm was dropped down to 30 degrees of abduction with arthroscope introduced in the subacromial space of the posterior portal and a direct lateral portal was  established in the subacromial space.  Abundant dense bursal tissue multiple adhesions  were encountered and these were all divided and excised with a combination of the shaver and the Stryker 1.  The wand was then used to remove the periosteum from the undersurface of the anterior half of the acromion and a subacromial decompression was performed with a bur crating a type I morphology.  Of note there was an accessory ossicle at the anterior margin of the acromion which was excised in its entirety.  The portal was then established directly anterior to the distal clavicle and a distal clavicle resection was performed with a bur.  Approximately 8 mm of bone was excised and care was taken to confirm visualization of the entire circumference of the distal clavicle to ensure adequate removal of bone.  We then completed the subacromial/subdeltoid bursectomy.  The rotator cuff tear was identified and debrided from the bursal side back to healthy tissue.  The footprint on the tuberosity was then prepared moving soft tissue using the Arthrex wand and then abrading the bone to bleeding bed.  With that the footprint was approximately 2 cm.  Through a stab wound to the lateral margin of the acromion placed an Arthrex peek swivel lock suture anchor loaded with 2 fiber tapes.  The 6 suture limbs were then shuttled equidistant across the width of the rotator cuff tear using the scorpion suture passer.  The suture limbs were then passed in an alternating fashion into 2 lateral row anchors crating a double row repair which allowed excellent apposition of the rotator cuff tendon to the footprint on the tuberosity.  Suture limbs were all then clipped.  Final hemostasis was obtained.  Fluid analysis removed.  The portals were closed with a Monocryl and Steri-Strip.  A dry dressing was then taped about the left shoulder left arm was placed in the sling immobilizer and the patient was awakened, extubated, and taken to the recovery in  stable condition.  Marin Shutter MD   Contact # [phone]

## 2022-12-09 NOTE — Transfer of Care (Signed)
Immediate Anesthesia Transfer of Care Note  Patient: Katherine Waters  Procedure(s) Performed: SHOULDER ARTHROSCOPY WITH DERIDEMENT AND  ROTATOR CUFF REPAIR (Left) SHOULDER ARTHROSCOPY WITH DISTAL CLAVICLE RESECTION AND SUBACROMIAL DECOMPRESSION,LABRAL DEBRIDEMENT (Left: Shoulder)  Patient Location: PACU  Anesthesia Type:GA combined with regional for post-op pain  Level of Consciousness: drowsy and patient cooperative  Airway & Oxygen Therapy: Patient Spontanous Breathing and Patient connected to face mask oxygen  Post-op Assessment: Report given to RN and Post -op Vital signs reviewed and stable  Post vital signs: Reviewed and stable  Last Vitals:  Vitals Value Taken Time  BP 153/90 12/09/22 1907  Temp 36.6 C 12/09/22 1906  Pulse 83 12/09/22 1906  Resp 18 12/09/22 1910  SpO2    Vitals shown include unvalidated device data.  Last Pain:  Vitals:   12/09/22 1715  TempSrc:   PainSc: 0-No pain      Patients Stated Pain Goal: 3 ([id number])  Complications: No notable events documented.

## 2022-12-09 NOTE — H&P (Signed)
Katherine Waters    Chief Complaint: Left shoulder impingement, acromioclavicular osteoarthritis, rotator cuff tear HPI: The patient is a 72 y.o. female with chronic and progressive increasing left shoulder pain related to impingement syndrome with recent MRI scan demonstrating a full-thickness rotator cuff tear as well as severe AC joint arthritis.  Due to her increasing functional limitations and failure to respond to prolonged attempts at conservative management, she is brought to the operating room at this time for planned left shoulder arthroscopy with debridement and anticipated rotator cuff repair.  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 Burt Knack and Dr. Roxy Manns    Severe aortic stenosis    Sleep apnea    Mild, CPAP wore for one year      Past Surgical History:  Procedure Laterality Date   ABDOMINAL HYSTERECTOMY     CARDIAC CATHETERIZATION     CATARACT EXTRACTION, BILATERAL     COLONOSCOPY WITH ESOPHAGOGASTRODUODENOSCOPY (EGD) AND ESOPHAGEAL DILATION (ED)     2016   INTRAOPERATIVE TRANSTHORACIC ECHOCARDIOGRAM Left 02/12/2021   Procedure: INTRAOPERATIVE TRANSTHORACIC ECHOCARDIOGRAM;  Surgeon: Sherren Mocha, MD;  Location: [facility];  Service: Open Heart Surgery;  Laterality: Left;   LAPAROSCOPIC BILATERAL SALPINGO OOPHERECTOMY Bilateral 04/02/2022   Procedure: LAPAROSCOPIC BILATERAL SALPINGO OOPHORECTOMY;  Surgeon: Christophe Louis, MD;  Location: [facility];  Service: Gynecology;  Laterality:  Bilateral;   LUMBAR FUSION  2013   POSTERIOR TIBIAL TENDON REPAIR Left 2019   RIGHT/LEFT HEART CATH AND CORONARY ANGIOGRAPHY N/A 01/29/2021   Procedure: RIGHT/LEFT HEART CATH AND CORONARY ANGIOGRAPHY;  Surgeon: Belva Crome, MD;  Location: [facility];  Service: Cardiovascular;  Laterality: N/A;   ROTATOR CUFF REPAIR Right    TRANSCATHETER AORTIC VALVE REPLACEMENT, TRANSFEMORAL N/A 02/12/2021   Procedure: TRANSCATHETER AORTIC VALVE REPLACEMENT, TRANSFEMORAL;  Surgeon: Sherren Mocha, MD;  Location: [facility];  Service: Open Heart Surgery;  Laterality: N/A;   TUBAL LIGATION     ULTRASOUND GUIDANCE FOR VASCULAR ACCESS Bilateral 02/12/2021   Procedure: ULTRASOUND GUIDANCE FOR VASCULAR ACCESS;  Surgeon: Sherren Mocha, MD;  Location: [facility];  Service: Open Heart Surgery;  Laterality: Bilateral;   WISDOM TOOTH EXTRACTION  1973    Family History  Problem Relation Age of Onset   COPD Mother    Heart attack Father 9   Cancer Sister    Cirrhosis Brother    Heart disease Brother     Social History:  reports that she quit smoking about 48 years ago. Her smoking use included cigarettes. She has a 3.00 pack-year smoking history. She has never used smokeless tobacco. She reports current alcohol use of about 1.0 standard drink of alcohol per week. She reports that she does not currently use drugs.  BMI: Estimated body mass index is 43.4 kg/m as calculated from the following:   Height as of this encounter: 5' 3"$  (1.6 m).  Weight as of this encounter: 111.1 kg.  Lab Results  Component Value Date   ALBUMIN 4.0 02/08/2021   Diabetes: Patient does not have a diagnosis of diabetes.     Smoking Status:       Medications Prior to Admission  Medication Sig Dispense Refill   acetaminophen (TYLENOL) 500 MG tablet Take 1,000 mg by mouth daily.  may take an additional dose (1000 mg) as needed for pain     amLODipine (NORVASC) 10 MG tablet Take 10 mg by mouth in the morning.     amoxicillin  (AMOXIL) 500 MG tablet TAKE 4 TABLETS (2,000 MG TOTAL) ONE HOUR PRIOR TO ALL DENTAL VISITS. 12 tablet 6   APPLE CIDER VINEGAR PO Take 2,400 mg by mouth daily.     aspirin 81 MG chewable tablet Chew 1 tablet (81 mg total) by mouth daily.     Cholecalciferol (DIALYVITE VITAMIN D 5000) 125 MCG (5000 UT) capsule Take 5,000 Units by mouth in the morning.     Cyanocobalamin (B-12) 1000 MCG SUBL Place 1,000 mcg under the tongue daily.     DULoxetine (CYMBALTA) 60 MG capsule Take 60 mg by mouth in the morning.     empagliflozin (JARDIANCE) 10 MG TABS tablet Take 1 tablet (10 mg total) by mouth daily before breakfast. 30 tablet 11   indapamide (LOZOL) 2.5 MG tablet Take 2 tablets (5 mg total) by mouth in the morning. 180 tablet 3   loratadine (CLARITIN) 10 MG tablet Take 10 mg by mouth daily.     MAGNESIUM GLUCONATE PO Take 1,000 mg by mouth daily.     Multiple Vitamins-Minerals (HAIR/SKIN/NAILS/BIOTIN) TABS Take 2,500 mcg by mouth in the morning.     omeprazole (PRILOSEC) 40 MG capsule Take 40 mg by mouth every morning.     pyridOXINE (VITAMIN B6) 100 MG tablet Take 200 mg by mouth daily.     spironolactone (ALDACTONE) 25 MG tablet TAKE 1 TABLET (25 MG TOTAL) BY MOUTH DAILY. 90 tablet 2   telmisartan (MICARDIS) 80 MG tablet Take 80 mg by mouth in the morning.     traMADol (ULTRAM) 50 MG tablet Take 50 mg by mouth every 12 (twelve) hours as needed for moderate pain.     TURMERIC-GINGER PO Take 1,950 mg by mouth daily. W /bioperine       Physical Exam: Left shoulder once again demonstrates functional motion but significant pain particular with overhead elevation.  Pain with manual muscle testing with generalized weakness secondary to pain and guarding.  She is otherwise neurovascular intact in left upper extremity.  Plain radiographs confirm advanced AC joint arthritis.  Recent MRI scan shows evidence for full-thickness rotator cuff tear, AC joint arthritis, impingement, as well as degenerative labral  tearing and chondromalacia  Vitals  Temp:  [99.2 F (37.3 C)] 99.2 F (37.3 C) (02/20 1430) Pulse Rate:  [92] 92 (02/20 1430) Resp:  [18] 18 (02/20 1430) BP: (179)/(73) 179/73 (02/20 1430) SpO2:  [99 %] 99 % (02/20 1430) Weight:  [111.1 kg] 111.1 kg (02/20 1607)  Assessment/Plan  Impression: Left shoulder impingement, acromioclavicular osteoarthritis, rotator cuff tear  Plan of Action: Procedure(s): SHOULDER ARTHROSCOPY WITH DERIDEMENT AND  ROTATOR CUFF REPAIR SHOULDER ARTHROSCOPY WITH DISTAL CLAVICLE RESECTION AND SUBACROMIAL DECOMPRESSION  Briah Nary M Chevon Fomby 12/09/2022, 4:15 PM Contact # [phone]

## 2022-12-09 NOTE — Anesthesia Procedure Notes (Signed)
Procedure Name: Intubation Date/Time: 12/09/2022 5:28 PM  Performed by: Jonna Munro, CRNAPre-anesthesia Checklist: Patient identified, Emergency Drugs available, Suction available, Patient being monitored and Timeout performed Patient Re-evaluated:Patient Re-evaluated prior to induction Oxygen Delivery Method: Circle system utilized Preoxygenation: Pre-oxygenation with 100% oxygen Induction Type: IV induction Ventilation: Mask ventilation without difficulty Laryngoscope Size: Glidescope and 3 Grade View: Grade I Tube type: Oral Tube size: 7.0 mm Number of attempts: 1 Airway Equipment and Method: Rigid stylet and Video-laryngoscopy Placement Confirmation: ETT inserted through vocal cords under direct vision, positive ETCO2, breath sounds checked- equal and bilateral and CO2 detector Secured at: 22 cm Tube secured with: Tape Dental Injury: Teeth and Oropharynx as per pre-operative assessment

## 2022-12-09 NOTE — Anesthesia Postprocedure Evaluation (Signed)
Anesthesia Post Note  Patient: Katherine Waters  Procedure(s) Performed: SHOULDER ARTHROSCOPY WITH DERIDEMENT AND  ROTATOR CUFF REPAIR (Left) SHOULDER ARTHROSCOPY WITH DISTAL CLAVICLE RESECTION AND SUBACROMIAL DECOMPRESSION,LABRAL DEBRIDEMENT (Left: Shoulder)     Patient location during evaluation: PACU Anesthesia Type: General Level of consciousness: awake and alert Pain management: pain level controlled Vital Signs Assessment: post-procedure vital signs reviewed and stable Respiratory status: spontaneous breathing, nonlabored ventilation and respiratory function stable Cardiovascular status: blood pressure returned to baseline Postop Assessment: no apparent nausea or vomiting Anesthetic complications: no   No notable events documented.  Last Vitals:  Vitals:   12/09/22 1915 12/09/22 1930  BP: (!) 145/87 (!) 146/101  Pulse: 84 83  Resp: 18 20  Temp:  36.6 C  SpO2: 94% 93%    Last Pain:  Vitals:   12/09/22 1930  TempSrc:   PainSc: 0-No pain                 Marthenia Rolling

## 2022-12-09 NOTE — Discharge Instructions (Signed)
   Kevin M. Supple, M.D., F.A.A.O.S. Orthopaedic Surgery Specializing in Arthroscopic and Reconstructive Surgery of the Shoulder [phone] [address] - [place], [place] [address] - Fax [phone]  POST-OP SHOULDER ARTHROSCOPIC ROTATOR CUFF  REPAIR INSTRUCTIONS  1. Call the office at [phone] to schedule your first post-op appointment 7-10 days from the date of your surgery.  2. Leave the steri-strips in place over your incisions when performing dressing changes and showering. You may remove your dressings and begin showering 72 hours from surgery. You can expect drainage that is clear to bloody in nature that occasionally will soak through your dressings. If this occurs go ahead and perform a dressing change. The drainage should lessen daily and when there is no drainage from your incisions feel free to go without a dressing.  3. Wear your sling/immobilizer at all times except to perform the exercises below or to occasionally let your arm dangle by your side to stretch your elbow. You also need to sleep in your sling immobilizer until instructed otherwise.  4. Range of motion to your elbow, wrist, and hand are encouraged 3-5 times daily. Exercise to your hand and fingers helps to reduce swelling you may experience.  5. Utilize ice to the shoulder 3-4 times minimum a day and additionally if you are experiencing pain.  6. You may one-armed drive when safely off of narcotics and muscle relaxants. You may use your hand that is in the sling to support the steering wheel only. However, should it be your right arm that is in the sling it is not to be used for gear shifting in a manual transmission.  7. Pain control following an exparel block  To help control your post-operative pain you received a nerve block  performed with Exparel which is a long acting anesthetic (numbing agent) which can provide pain relief and sensations of numbness (and relief of pain) in the operative  shoulder and arm for up to 3 days. Sometimes it provides mixed relief, meaning you may still have numbness in certain areas of the arm but can still be able to move  parts of that arm, hand, and fingers. We recommend that your prescribed pain medications  be used as needed. We do not feel it is necessary to "pre medicate" and "stay ahead" of pain.  Taking narcotic pain medications when you are not having any pain can lead to unnecessary and potentially dangerous side effects.    8. Pain medications can produce constipation along with their use. If you experience this, the use of an over the counter stool softener or laxative daily is recommended.   9. For additional questions or concerns, please do not hesitate to call the office. If after hours there is an answering service to forward your concerns to the physician on call.  POST-OP EXERCISES  Pendulum Exercises  Perform pendulum exercises while standing and bending at the waist. Support your uninvolved arm on a table or chair and allow your operated arm to hang freely. Make sure to do these exercises passively - not using you shoulder muscle.  Repeat 20 times. Do 3 sessions per day.   

## 2022-12-10 ENCOUNTER — Encounter (HOSPITAL_COMMUNITY): Payer: Self-pay | Admitting: Orthopedic Surgery

## 2023-01-04 NOTE — Progress Notes (Unsigned)
Cardiology Office Note:   Date:  01/08/2023  NAME:  Katherine Waters    MRN: [id number] DOB:  02/24/1951   PCP:  Almedia Balls, NP  Cardiologist:  Evalina Field, MD  Electrophysiologist:  None   Referring MD: Almedia Balls, NP   Chief Complaint  Patient presents with   Follow-up        History of Present Illness:   Katherine Waters is a 72 y.o. female with a hx of obesity, TAVR, OSA, COPD, mitral stenosis, HFpEF who presents for follow-up.  She reports she is still short of breath with activity.  Recently had left shoulder surgery.  She reports any activity gets her profoundly short of breath.  She is still gaining weight.  No signs of volume overload today.  We discussed that her shortness of breath is multifactorial.  She reports she had pulmonary testing in [place] prior to moving to [place].  She is working on diet but cannot lose weight.  We discussed checking an A1c.  She likely is a good candidate for [facility]  She has not reached out to the [facility] weight loss clinic yet.  Blood pressure 130/70.  We also would like to recheck an echo to determine if her mitral valve is worsening.  She reports no chest pain or pressure.  She is short of breath with activity.  Symptoms or not improving.  Despite her best efforts she has not been able to lose weight.  Problem List 1. Aortic stenosis s/p TAVR -LHC: normal coronaries 01/29/2021 -29 mm Evolut Pro 02/12/2021 2. HTN 3. HLD 4. OSA, mild 5. COPD, mild 6. 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 Atrial tachycardia  8. HFpEF 9. Pulmonary hypertension -moderate mPAP 35 mmHG PCWP 22 mmHG 10.  Moderate calcific mitral valve stenosis -MG 8 mmHG  Past Medical History: Past Medical History:  Diagnosis Date   Arthritis    osteoarthritis   Asthma    Seasonal   Connective tissue disease ([place])    CREST syndrome (HCC)    Depression    Dyspnea    Fibromyalgia    GERD (gastroesophageal reflux disease)    History of migraine  headaches    Horseshoe kidney    Hyperlipidemia    Hypertension    Inflammatory arthritis    PONV (postoperative nausea and vomiting)    Raynaud disease    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   s/p TAVR w/ a 29 mm Medtronic Evolut Pro + via the TF approach by Dr. Burt Knack and Dr. Roxy Manns    Severe aortic stenosis    Sleep apnea    Mild, CPAP wore for one year    Past Surgical History: Past Surgical History:  Procedure Laterality Date   ABDOMINAL HYSTERECTOMY     CARDIAC CATHETERIZATION     CATARACT EXTRACTION, BILATERAL     COLONOSCOPY WITH ESOPHAGOGASTRODUODENOSCOPY (EGD) AND ESOPHAGEAL DILATION (ED)     2016   INTRAOPERATIVE TRANSTHORACIC ECHOCARDIOGRAM Left 02/12/2021   Procedure: INTRAOPERATIVE TRANSTHORACIC ECHOCARDIOGRAM;  Surgeon: Sherren Mocha, MD;  Location: [place];  Service: Open Heart Surgery;  Laterality: Left;   LAPAROSCOPIC BILATERAL SALPINGO OOPHERECTOMY Bilateral 04/02/2022   Procedure: LAPAROSCOPIC BILATERAL SALPINGO OOPHORECTOMY;  Surgeon: Christophe Louis, MD;  Location: [facility];  Service: Gynecology;  Laterality: Bilateral;   LUMBAR FUSION  2013   POSTERIOR TIBIAL TENDON REPAIR Left 2019   RIGHT/LEFT HEART CATH AND CORONARY ANGIOGRAPHY N/A 01/29/2021   Procedure: RIGHT/LEFT HEART CATH AND CORONARY ANGIOGRAPHY;  Surgeon: Daneen Schick  W, MD;  Location: [facility];  Service: Cardiovascular;  Laterality: N/A;   ROTATOR CUFF REPAIR Right    SHOULDER ARTHROSCOPY WITH DISTAL CLAVICLE RESECTION Left 12/09/2022   Procedure: SHOULDER ARTHROSCOPY WITH DISTAL CLAVICLE RESECTION AND SUBACROMIAL DECOMPRESSION,LABRAL DEBRIDEMENT;  Surgeon: Justice Britain, MD;  Location: [facility];  Service: Orthopedics;  Laterality: Left;  exparel block 120   SHOULDER ARTHROSCOPY WITH ROTATOR CUFF REPAIR Left 12/09/2022   Procedure: SHOULDER ARTHROSCOPY WITH DERIDEMENT AND  ROTATOR CUFF REPAIR;  Surgeon: Justice Britain, MD;  Location: [facility];  Service: Orthopedics;  Laterality:  Left;  exparel block 120   TRANSCATHETER AORTIC VALVE REPLACEMENT, TRANSFEMORAL N/A 02/12/2021   Procedure: TRANSCATHETER AORTIC VALVE REPLACEMENT, TRANSFEMORAL;  Surgeon: Sherren Mocha, MD;  Location: [place] [facility];  Service: Open Heart Surgery;  Laterality: N/A;   TUBAL LIGATION     ULTRASOUND GUIDANCE FOR VASCULAR ACCESS Bilateral 02/12/2021   Procedure: ULTRASOUND GUIDANCE FOR VASCULAR ACCESS;  Surgeon: Sherren Mocha, MD;  Location: [facility];  Service: Open Heart Surgery;  Laterality: Bilateral;   WISDOM TOOTH EXTRACTION  1973    Current Medications: Current Meds  Medication Sig   acetaminophen (TYLENOL) 500 MG tablet Take 1,000 mg by mouth daily.  may take an additional dose (1000 mg) as needed for pain   amLODipine (NORVASC) 10 MG tablet Take 10 mg by mouth in the morning.   amoxicillin (AMOXIL) 500 MG tablet TAKE 4 TABLETS (2,000 MG TOTAL) ONE HOUR PRIOR TO ALL DENTAL VISITS.   APPLE CIDER VINEGAR PO Take 2,400 mg by mouth daily.   aspirin 81 MG chewable tablet Chew 1 tablet (81 mg total) by mouth daily.   Cholecalciferol (DIALYVITE VITAMIN D 5000) 125 MCG (5000 UT) capsule Take 5,000 Units by mouth in the morning.   Cyanocobalamin (B-12) 1000 MCG SUBL Place 1,000 mcg under the tongue daily.   DULoxetine (CYMBALTA) 60 MG capsule Take 60 mg by mouth in the morning.   empagliflozin (JARDIANCE) 10 MG TABS tablet Take 1 tablet (10 mg total) by mouth daily before breakfast.   indapamide (LOZOL) 2.5 MG tablet Take 2 tablets (5 mg total) by mouth in the morning.   loratadine (CLARITIN) 10 MG tablet Take 10 mg by mouth daily.   MAGNESIUM GLUCONATE PO Take 1,000 mg by mouth daily.   Multiple Vitamins-Minerals (HAIR/SKIN/NAILS/BIOTIN) TABS Take 2,500 mcg by mouth in the morning.   omeprazole (PRILOSEC) 40 MG capsule Take 40 mg by mouth every morning.   pyridOXINE (VITAMIN B6) 100 MG tablet Take 200 mg by mouth daily.   spironolactone (ALDACTONE) 25 MG tablet TAKE 1 TABLET (25 MG TOTAL) BY MOUTH  DAILY.   telmisartan (MICARDIS) 80 MG tablet Take 80 mg by mouth in the morning.   traMADol (ULTRAM) 50 MG tablet Take 50 mg by mouth every 12 (twelve) hours as needed for moderate pain.   TURMERIC-GINGER PO Take 1,950 mg by mouth daily. W /bioperine     Allergies:    Dobutamine, Nsaids, Latex, Sulfa antibiotics, and Tape   Social History: Social History   Socioeconomic History   Marital status: Married    Spouse name: Not on file   Number of children: 1   Years of education: 12   Highest education level: High school graduate  Occupational History   Occupation: retired Radiation protection practitioner  Tobacco Use   Smoking status: Former    Packs/day: 0.50    Years: 6.00    Additional pack years: 0.00    Total pack years: 3.00  Types: Cigarettes    Quit date: 36    Years since quitting: 48.2   Smokeless tobacco: Never  Vaping Use   Vaping Use: Never used  Substance and Sexual Activity   Alcohol use: Yes    Alcohol/week: 1.0 standard drink of alcohol    Types: 1 Shots of liquor per week    Comment: occ   Drug use: Not Currently   Sexual activity: Not on file  Other Topics Concern   Not on file  Social History Narrative   Not on file   Social Determinants of Health   Financial Resource Strain: Not on file  Food Insecurity: Not on file  Transportation Needs: Not on file  Physical Activity: Not on file  Stress: Not on file  Social Connections: Not on file     Family History: The patient's family history includes COPD in her mother; Cancer in her sister; Cirrhosis in her brother; Heart attack (age of onset: 37) in her father; Heart disease in her brother.  ROS:   All other ROS reviewed and negative. Pertinent positives noted in the HPI.     EKGs/Labs/Other Studies Reviewed:   The following studies were personally reviewed by me today:   TTE 12/19/2021    1. Left ventricular ejection fraction, by estimation, is 60 to 65%. The  left ventricle has normal function. The left  ventricle has no regional  wall motion abnormalities. There is mild left ventricular hypertrophy.  Left ventricular diastolic parameters  are consistent with Grade I diastolic dysfunction (impaired relaxation).   2. Right ventricular systolic function is normal. The right ventricular  size is normal. Tricuspid regurgitation signal is inadequate for assessing  PA pressure.   3. Left atrial size was moderately dilated.   4. The mitral valve is degenerative. Mild mitral valve regurgitation.  Moderate to severe mitral stenosis. The mean mitral valve gradient is 8.0  mmHg with MVA by VTI 1.48 cm^2. Moderate mitral annular calcification.   5. Bioprosthetic aortic valve s/p TAVR with 29 mm Medtronic Evolut THV.  Mean gradient 9 mmHg. Mild perivalvular leakage (difficult to visualize  with turbulent flow from mitral stenosis.   6. Aortic dilatation noted. There is mild dilatation of the ascending  aorta, measuring 37 mm.   7. The inferior vena cava is normal in size with greater than 50%  respiratory variability, suggesting right atrial pressure of 3 mmHg.   8. Possible PFO vs ASD noted on subcostal window.    Recent Labs: 12/09/2022: BUN 21; Creatinine, Ser 0.96; Hemoglobin 15.4; Platelets 264; Potassium 3.7; Sodium 135   Recent Lipid Panel    Component Value Date/Time   CHOL 163 01/10/2021 1050   TRIG 98 01/10/2021 1050   HDL 70 01/10/2021 1050   CHOLHDL 2.3 01/10/2021 1050   LDLCALC 75 01/10/2021 1050    Physical Exam:   VS:  BP 138/70   Pulse 94   Ht 5\' 3"  (1.6 m)   Wt 248 lb 6.4 oz (112.7 kg)   SpO2 97%   BMI 44.00 kg/m    Wt Readings from Last 3 Encounters:  01/08/23 248 lb 6.4 oz (112.7 kg)  12/09/22 245 lb (111.1 kg)  04/14/22 246 lb 6.4 oz (111.8 kg)    General: Well nourished, well developed, in no acute distress Head: Atraumatic, normal size  Eyes: PEERLA, EOMI  Neck: Supple, no JVD Endocrine: No thryomegaly Cardiac: Normal S1, S2; RRR; no murmurs, rubs, or  gallops Lungs: Clear to auscultation bilaterally, no wheezing,  rhonchi or rales  Abd: Soft, nontender, no hepatomegaly  Ext: No edema, pulses 2+ Musculoskeletal: No deformities, BUE and BLE strength normal and equal Skin: Warm and dry, no rashes   Neuro: Alert and oriented to person, place, time, and situation, CNII-XII grossly intact, no focal deficits  Psych: Normal mood and affect   ASSESSMENT:   Katherine Waters is a 72 y.o. female who presents for the following: 1. Chronic diastolic heart failure ([place])   2. SOB (shortness of breath)   3. Nonrheumatic mitral valve stenosis   4. Pulmonary hypertension, unspecified ([place])   5. S/P TAVR (transcatheter aortic valve replacement)   6. Primary hypertension   7. Obesity, morbid, BMI 40.0-49.9 ([place])   8. Other abnormal glucose     PLAN:   1. Chronic diastolic heart failure ([place]) 2. SOB (shortness of breath) -She has a long history of diastolic heart failure and morbid obesity.  I suspect most of her symptoms are related to obesity.  She has had pulmonary testing in [place] which was normal.  She is on Aldactone and Jardiance.  Euvolemic today on exam.  She reports she is just unable to exercise.  She does have calcific mitral valve stenosis.  Will recheck an echo to make sure this is not worsening.  Everything seems to be stable.  We discussed weight loss clinic.  She will reach out to her primary care physician.  I would like to check an A1c as prediabetes will qualify her for [facility].  She is interested.  Her examination is unchanged.  She has no obstructive coronary disease.  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  Exercise would help her immensely.  3. Nonrheumatic mitral valve stenosis -Moderate calcific mitral valve stenosis.  Recheck echo given above symptoms.  4. Pulmonary hypertension, unspecified ([place]) -Likely multifactorial.  Suspect HFpEF is contributing.  Recheck echo as above.  5. S/P TAVR  (transcatheter aortic valve replacement) -Status post TAVR.  Continue aspirin.  She understands needs antibiotics before dental work.  Recheck echo as above.  6. Primary hypertension -Well-controlled.  Currently amlodipine 10 mg daily, indapamide 5 mg daily, Jardiance 10 mg daily, Aldactone 25 mg daily, telmisartan 80 mg daily.  7. Obesity, morbid, BMI 40.0-49.9 ([place]) -Contributing to her shortness of breath.  Has not been able to lose weight.  Check A1c today.  She may be a good candidate for [facility].  8. Other abnormal glucose -A1c today.     Disposition: Return in about 6 months (around 07/11/2023).  Medication Adjustments/Labs and Tests Ordered: Current medicines are reviewed at length with the patient today.  Concerns regarding medicines are outlined above.  Orders Placed This Encounter  Procedures   Hemoglobin A1c   ECHOCARDIOGRAM COMPLETE   No orders of the defined types were placed in this encounter.   Patient Instructions  Medication Instructions:  The current medical regimen is effective;  continue present plan and medications.  *If you need a refill on your cardiac medications before your next appointment, please call your pharmacy*  Labs: A1C today  Testing/Procedures: Echocardiogram - Your physician has requested that you have an echocardiogram. Echocardiography is a painless test that uses sound waves to create images of your heart. It provides your doctor with information about the size and shape of your heart and how well your heart's chambers and valves are working. This procedure takes approximately one hour. There are no restrictions for this procedure.     Follow-Up: At [facility], you and your health needs are our priority.  As part of our continuing mission to provide you with exceptional heart care, we have created designated Provider Care Teams.  These Care Teams include your primary Cardiologist (physician) and Advanced Practice Providers  (APPs -  Physician Assistants and Nurse Practitioners) who all work together to provide you with the care you need, when you need it.  We recommend signing up for the patient portal called "MyChart".  Sign up information is provided on this After Visit Summary.  MyChart is used to connect with patients for Virtual Visits (Telemedicine).  Patients are able to view lab/test results, encounter notes, upcoming appointments, etc.  Non-urgent messages can be sent to your provider as well.   To learn more about what you can do with MyChart, go to [email or web address].    Your next appointment:   6 month(s)  Provider:   Evalina Field, MD         Time Spent with Patient: I have spent a total of 35 minutes with patient reviewing hospital notes, telemetry, EKGs, labs and examining the patient as well as establishing an assessment and plan that was discussed with the patient.  > 50% of time was spent in direct patient care.  Signed, Addison Naegeli. Audie Box, MD, [place]  [address] [place], [place] [address] [phone]  01/08/2023 11:22 AM

## 2023-01-08 ENCOUNTER — Encounter: Payer: Self-pay | Admitting: Cardiovascular Disease

## 2023-01-08 ENCOUNTER — Ambulatory Visit: Payer: Medicare Other | Attending: Cardiovascular Disease | Admitting: Cardiovascular Disease

## 2023-01-08 VITALS — BP 138/70 | HR 94 | Ht 63.0 in | Wt 248.4 lb

## 2023-01-08 DIAGNOSIS — R7309 Other abnormal glucose: Secondary | ICD-10-CM | POA: Diagnosis present

## 2023-01-08 DIAGNOSIS — I272 Pulmonary hypertension, unspecified: Secondary | ICD-10-CM

## 2023-01-08 DIAGNOSIS — Z952 Presence of prosthetic heart valve: Secondary | ICD-10-CM | POA: Diagnosis present

## 2023-01-08 DIAGNOSIS — R0602 Shortness of breath: Secondary | ICD-10-CM | POA: Diagnosis not present

## 2023-01-08 DIAGNOSIS — I342 Nonrheumatic mitral (valve) stenosis: Secondary | ICD-10-CM | POA: Insufficient documentation

## 2023-01-08 DIAGNOSIS — I5032 Chronic diastolic (congestive) heart failure: Secondary | ICD-10-CM | POA: Diagnosis not present

## 2023-01-08 DIAGNOSIS — I10 Essential (primary) hypertension: Secondary | ICD-10-CM

## 2023-01-08 NOTE — Patient Instructions (Signed)
Medication Instructions:  The current medical regimen is effective;  continue present plan and medications.  *If you need a refill on your cardiac medications before your next appointment, please call your pharmacy*  Labs: A1C today  Testing/Procedures: Echocardiogram - Your physician has requested that you have an echocardiogram. Echocardiography is a painless test that uses sound waves to create images of your heart. It provides your doctor with information about the size and shape of your heart and how well your heart's chambers and valves are working. This procedure takes approximately one hour. There are no restrictions for this procedure.     Follow-Up: At [facility], you and your health needs are our priority.  As part of our continuing mission to provide you with exceptional heart care, we have created designated Provider Care Teams.  These Care Teams include your primary Cardiologist (physician) and Advanced Practice Providers (APPs -  Physician Assistants and Nurse Practitioners) who all work together to provide you with the care you need, when you need it.  We recommend signing up for the patient portal called "MyChart".  Sign up information is provided on this After Visit Summary.  MyChart is used to connect with patients for Virtual Visits (Telemedicine).  Patients are able to view lab/test results, encounter notes, upcoming appointments, etc.  Non-urgent messages can be sent to your provider as well.   To learn more about what you can do with MyChart, go to [email or web address].    Your next appointment:   6 month(s)  Provider:   Evalina Field, MD

## 2023-01-09 ENCOUNTER — Encounter: Payer: Self-pay | Admitting: Cardiovascular Disease

## 2023-01-09 LAB — HEMOGLOBIN A1C
Est. average glucose Bld gHb Est-mCnc: 140 mg/dL
Hgb A1c MFr Bld: 6.5 % — ABNORMAL HIGH (ref 4.8–5.6)

## 2023-02-06 ENCOUNTER — Other Ambulatory Visit: Payer: Self-pay | Admitting: Family Medicine

## 2023-02-06 DIAGNOSIS — Z13820 Encounter for screening for osteoporosis: Secondary | ICD-10-CM

## 2023-02-06 DIAGNOSIS — Z1231 Encounter for screening mammogram for malignant neoplasm of breast: Secondary | ICD-10-CM

## 2023-02-10 ENCOUNTER — Ambulatory Visit (HOSPITAL_COMMUNITY): Payer: Medicare Other | Attending: Cardiovascular Disease

## 2023-02-10 DIAGNOSIS — I5032 Chronic diastolic (congestive) heart failure: Secondary | ICD-10-CM | POA: Diagnosis not present

## 2023-02-10 LAB — ECHOCARDIOGRAM COMPLETE
AV Mean grad: 9.5 mmHg
AV Peak grad: 16.6 mmHg
Ao pk vel: 2.04 m/s
Area-P 1/2: 1.73 cm2
P 1/2 time: 599 msec
S' Lateral: 2 cm

## 2023-03-20 ENCOUNTER — Telehealth: Payer: Self-pay | Admitting: *Deleted

## 2023-03-20 NOTE — Telephone Encounter (Signed)
Left message to call back to set up tele pre op appt.  

## 2023-03-20 NOTE — Telephone Encounter (Signed)
   [facility] Pre-operative Risk Assessment    Patient Name: Katherine Waters  DOB: [birth year] MRN: [id number]  HEARTCARE STAFF:  - IMPORTANT!!!!!! Under Visit Info/Reason for Call, type in Other and utilize the format Clearance MM/DD/YY or Clearance TBD. Do not use dashes or single digits. - Please review there is not already an duplicate clearance open for this procedure. - If request is for dental extraction, please clarify the # of teeth to be extracted. - If the patient is currently at the dentist's office, call Pre-Op Callback Staff (MA/nurse) to input urgent request.  - If the patient is not currently in the dentist office, please route to the Pre-Op pool.  Request for surgical clearance:  What type of surgery is being performed? Colonoscopy  When is this surgery scheduled? 05/01/23  What type of clearance is required (medical clearance vs. Pharmacy clearance to hold med vs. Both)? Both   Are there any medications that need to be held prior to surgery and how long? On Asa 81 mg, but no request to hold on form  Practice name and name of physician performing surgery? [facility], Dr Levora Angel  What is the office phone number? [phone]   7.   What is the office fax number? [phone]  8.   Anesthesia type (None, local, MAC, general) ? Not listed  Also asking if pt will need abx prior, due to AVR.   Valrie Hart 03/20/2023, 4:02 PM  _________________________________________________________________   (provider comments below)

## 2023-03-20 NOTE — Telephone Encounter (Signed)
   Name: Katherine Waters  DOB: 06-Nov-1950  MRN: [id number]  Primary Cardiologist: Reatha Harps, MD   Preoperative team, please contact this patient and set up a phone call appointment for further preoperative risk assessment. Please obtain consent and complete medication review. Thank you for your help.  I confirm that guidance regarding antiplatelet and oral anticoagulation therapy has been completed and, if necessary, noted below.  If necessary her aspirin may be held for 5 to 7 days prior to her procedure.  Please resume as soon as hemostasis is achieved.   Ronney Asters, NP 03/20/2023, 4:20 PM [facility]

## 2023-03-23 ENCOUNTER — Telehealth: Payer: Self-pay | Admitting: *Deleted

## 2023-03-23 NOTE — Telephone Encounter (Signed)
I s/w the pt and we have scheduled a tele pre op appt 04/10/23 @ 9 am. Med rec and consent are done.   Patient Consent for Virtual Visit        Katherine Waters has provided verbal consent on 03/23/2023 for a virtual visit (video or telephone).   CONSENT FOR VIRTUAL VISIT FOR:  Katherine Waters  By participating in this virtual visit I agree to the following:  I hereby voluntarily request, consent and authorize [facility] and its employed or contracted physicians, physician assistants, nurse practitioners or other licensed health care professionals (the Practitioner), to provide me with telemedicine health care services (the "Services") as deemed necessary by the treating Practitioner. I acknowledge and consent to receive the Services by the Practitioner via telemedicine. I understand that the telemedicine visit will involve communicating with the Practitioner through live audiovisual communication technology and the disclosure of certain medical information by electronic transmission. I acknowledge that I have been given the opportunity to request an in-person assessment or other available alternative prior to the telemedicine visit and am voluntarily participating in the telemedicine visit.  I understand that I have the right to withhold or withdraw my consent to the use of telemedicine in the course of my care at any time, without affecting my right to future care or treatment, and that the Practitioner or I may terminate the telemedicine visit at any time. I understand that I have the right to inspect all information obtained and/or recorded in the course of the telemedicine visit and may receive copies of available information for a reasonable fee.  I understand that some of the potential risks of receiving the Services via telemedicine include:  Delay or interruption in medical evaluation due to technological equipment failure or disruption; Information transmitted may not be sufficient (e.g.  poor resolution of images) to allow for appropriate medical decision making by the Practitioner; and/or  In rare instances, security protocols could fail, causing a breach of personal health information.  Furthermore, I acknowledge that it is my responsibility to provide information about my medical history, conditions and care that is complete and accurate to the best of my ability. I acknowledge that Practitioner's advice, recommendations, and/or decision may be based on factors not within their control, such as incomplete or inaccurate data provided by me or distortions of diagnostic images or specimens that may result from electronic transmissions. I understand that the practice of medicine is not an exact science and that Practitioner makes no warranties or guarantees regarding treatment outcomes. I acknowledge that a copy of this consent can be made available to me via my patient portal [facility] MyChart), or I can request a printed copy by calling the office of [facility].    I understand that my insurance will be billed for this visit.   I have read or had this consent read to me. I understand the contents of this consent, which adequately explains the benefits and risks of the Services being provided via telemedicine.  I have been provided ample opportunity to ask questions regarding this consent and the Services and have had my questions answered to my satisfaction. I give my informed consent for the services to be provided through the use of telemedicine in my medical care

## 2023-03-23 NOTE — Telephone Encounter (Signed)
I s/w the pt and we have scheduled a tele pre op appt 04/10/23 @ 9 am. Med rec and consent are

## 2023-03-28 ENCOUNTER — Other Ambulatory Visit: Payer: Self-pay | Admitting: Cardiovascular Disease

## 2023-04-09 NOTE — Progress Notes (Signed)
Virtual Visit via Telephone Note   Because of [name]'s co-morbid illnesses, Katherine Waters is at least at moderate risk for complications without adequate follow up.  This format is felt to be most appropriate for this patient at this time.  The patient did not have access to video technology/had technical difficulties with video requiring transitioning to audio format only (telephone).  All issues noted in this document were discussed and addressed.  No physical exam could be performed with this format.  Please refer to the patient's chart for her consent to telehealth for [facility].  Evaluation Performed:  Preoperative cardiovascular risk assessment _____________   Date:  04/09/2023   Patient ID:  Katherine, Waters 1951/05/25, MRN [id number] Patient Location:  Home Provider location:   Office  Primary Care Provider:  Carilyn Goodpasture, NP Primary Cardiologist:  Reatha Harps, MD  Chief Complaint / Patient Profile   72 y.o. y/o female with a h/o chronic diastolic CHF, shortness of breath, TAVR HTN, obesity who is pending colonoscopy and presents today for telephonic preoperative cardiovascular risk assessment.  History of Present Illness    Katherine Waters is a 72 y.o. female who presents via audio/video conferencing for a telehealth visit today.  Pt was last seen in cardiology clinic on 01/08/2023 by Dr. Flora Lipps.  At that time [name] was doing well .  The patient is now pending procedure as outlined above. Since her last visit, Katherine Waters remains stable from a cardiac standpoint.  Today Katherine Waters denies chest pain,  lower extremity edema, fatigue, palpitations, melena, hematuria, hemoptysis, diaphoresis, weakness, presyncope, syncope, orthopnea, and PND.   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 Excell Seltzer and Dr. Cornelius Moras    Severe aortic stenosis    Sleep apnea    Mild, CPAP wore for one year   Past Surgical History:  Procedure Laterality Date   ABDOMINAL HYSTERECTOMY     CARDIAC CATHETERIZATION     CATARACT EXTRACTION, BILATERAL     COLONOSCOPY WITH ESOPHAGOGASTRODUODENOSCOPY (EGD) AND ESOPHAGEAL DILATION (ED)     2016   INTRAOPERATIVE TRANSTHORACIC ECHOCARDIOGRAM Left 02/12/2021   Procedure: INTRAOPERATIVE TRANSTHORACIC ECHOCARDIOGRAM;  Surgeon: Tonny Bollman, MD;  Location: [facility] OR;  Service: Open Heart Surgery;  Laterality: Left;   LAPAROSCOPIC BILATERAL SALPINGO OOPHERECTOMY Bilateral 04/02/2022   Procedure: LAPAROSCOPIC BILATERAL SALPINGO OOPHORECTOMY;  Surgeon: Gerald Leitz, MD;  Location: [facility] OR;  Service: Gynecology;  Laterality: Bilateral;   LUMBAR FUSION  2013   POSTERIOR TIBIAL TENDON REPAIR Left 2019   RIGHT/LEFT HEART CATH AND CORONARY ANGIOGRAPHY N/A 01/29/2021   Procedure: RIGHT/LEFT HEART CATH AND CORONARY ANGIOGRAPHY;  Surgeon: Lyn Records, MD;  [facility];  Service: Cardiovascular;  Laterality: N/A;   ROTATOR CUFF REPAIR Right    SHOULDER ARTHROSCOPY WITH DISTAL CLAVICLE RESECTION Left 12/09/2022  Procedure: SHOULDER ARTHROSCOPY WITH DISTAL CLAVICLE RESECTION AND SUBACROMIAL DECOMPRESSION,LABRAL DEBRIDEMENT;  Surgeon: Francena Hanly, MD;  Location: [facility];  Service: Orthopedics;  Laterality: Left;  exparel block 120   SHOULDER ARTHROSCOPY WITH ROTATOR CUFF REPAIR Left 12/09/2022   Procedure: SHOULDER ARTHROSCOPY WITH DERIDEMENT AND  ROTATOR CUFF REPAIR;  Surgeon: Francena Hanly, MD;  Location: [facility];   Service: Orthopedics;  Laterality: Left;  exparel block 120   TRANSCATHETER AORTIC VALVE REPLACEMENT, TRANSFEMORAL N/A 02/12/2021   Procedure: TRANSCATHETER AORTIC VALVE REPLACEMENT, TRANSFEMORAL;  Surgeon: Tonny Bollman, MD;  Location: [facility] OR;  Service: Open Heart Surgery;  Laterality: N/A;   TUBAL LIGATION     ULTRASOUND GUIDANCE FOR VASCULAR ACCESS Bilateral 02/12/2021   Procedure: ULTRASOUND GUIDANCE FOR VASCULAR ACCESS;  Surgeon: Tonny Bollman, MD;  Location: [facility] OR;  Service: Open Heart Surgery;  Laterality: Bilateral;   WISDOM TOOTH EXTRACTION  1973    Allergies  Allergies  Allergen Reactions   Dobutamine     Decreases heart rate    Nsaids     Elevated kidney levels recommended to avoid NSAIDS   Latex Rash   Sulfa Antibiotics Rash   Tape Rash    Adhesive/Band-aid    Home Medications    Prior to Admission medications   Medication Sig Start Date End Date Taking? Authorizing Provider  acetaminophen (TYLENOL) 500 MG tablet Take 1,000 mg by mouth daily.  may take an additional dose (1000 mg) as needed for pain    [provider]  amLODipine (NORVASC) 10 MG tablet Take 10 mg by mouth in the morning. 11/16/20   [provider]  amoxicillin (AMOXIL) 500 MG tablet TAKE 4 TABLETS (2,000 MG TOTAL) ONE HOUR PRIOR TO ALL DENTAL VISITS. 05/05/22   Janetta Hora, PA-C  APPLE CIDER VINEGAR PO Take 2,400 mg by mouth daily.    [provider]  aspirin 81 MG chewable tablet Chew 1 tablet (81 mg total) by mouth daily. 02/14/21   Janetta Hora, PA-C  Cholecalciferol (DIALYVITE VITAMIN D 5000) 125 MCG (5000 UT) capsule Take 5,000 Units by mouth in the morning. Patient not taking: Reported on 03/23/2023    [provider]  Cholecalciferol (VITAMIN D-3) 125 MCG (5000 UT) TABS Take 5,000 Units by mouth daily.    [provider]  Cyanocobalamin (B-12) 1000 MCG SUBL Place 1,000 mcg under the tongue daily.    [provider]  DULoxetine  (CYMBALTA) 60 MG capsule Take 60 mg by mouth in the morning.    [provider]  empagliflozin (JARDIANCE) 10 MG TABS tablet Take 1 tablet (10 mg total) by mouth daily before breakfast. 04/14/22   O'Neal, Ronnald Ramp, MD  indapamide (LOZOL) 2.5 MG tablet Take 2 tablets (5 mg total) by mouth in the morning. 04/14/22   O'Neal, Ronnald Ramp, MD  loratadine (CLARITIN) 10 MG tablet Take 10 mg by mouth daily.    [provider]  MAGNESIUM GLUCONATE PO Take 1,000 mg by mouth daily.    [provider]  Multiple Vitamins-Minerals (HAIR/SKIN/NAILS/BIOTIN) TABS Take 2,500 mcg by mouth in the morning.    [provider]  omeprazole (PRILOSEC) 40 MG capsule Take 40 mg by mouth every morning. 02/12/22   [provider]  OZEMPIC, 0.25 OR 0.5 MG/DOSE, 2 MG/3ML SOPN AS DIRECTED PT STATES Katherine Waters IS ON THE 0.5 MG 01/19/23   [provider]  pyridOXINE (VITAMIN B6) 100 MG tablet Take 200 mg by mouth daily.    [provider]  spironolactone (ALDACTONE) 25 MG tablet TAKE 1 TABLET (25 MG TOTAL) BY MOUTH DAILY. 03/30/23   O'NealRonnald Ramp, MD  telmisartan (MICARDIS) 80 MG tablet Take 80 mg by mouth in the morning. 10/20/20   [provider]  traMADol (ULTRAM) 50 MG tablet Take 50 mg by mouth every 12 (twelve) hours as needed for moderate pain. 10/22/22   [provider]  TURMERIC-GINGER PO Take 1,950 mg by mouth daily. W /bioperine    [provider]    Physical Exam    Vital Signs:  Tiernan Wickard does not have vital signs available for review today.  Given telephonic nature of communication, physical exam is limited. AAOx3. NAD. Normal affect.  Speech and respirations are unlabored.  Accessory Clinical Findings    None  Assessment & Plan    1.  Preoperative Cardiovascular Risk Assessment: Colonoscopy, [facility], Dr. Levora Angel fax #[phone]      Primary Cardiologist: Reatha Harps, MD  Chart reviewed as part of  pre-operative protocol coverage. Given past medical history and time since last visit, based on ACC/AHA guidelines, Robecca Bentsen would be at acceptable risk for the planned procedure without further cardiovascular testing.   If necessary her aspirin may be held for 5 to 7 days prior to her procedure. Please resume as soon as hemostasis is achieved.   Patient was advised that if Katherine Waters develops new symptoms prior to surgery to contact our office to arrange a follow-up appointment.  Katherine Waters verbalized understanding.  I will route this recommendation to the requesting party via Epic fax function and remove from pre-op pool.     Time:   Today, I have spent 5 minutes with the patient with telehealth technology discussing medical history, symptoms, and management plan.  Prior to her phone evaluation I spent greater than 10 minutes reviewing her past medical history and cardiac medications.   Ronney Asters, NP  04/09/2023, 4:37 PM

## 2023-04-10 ENCOUNTER — Ambulatory Visit: Payer: Medicare Other | Attending: Cardiovascular Disease

## 2023-04-10 DIAGNOSIS — Z01810 Encounter for preprocedural cardiovascular examination: Secondary | ICD-10-CM | POA: Diagnosis not present

## 2023-04-20 ENCOUNTER — Other Ambulatory Visit (HOSPITAL_COMMUNITY): Payer: Self-pay

## 2023-04-20 DIAGNOSIS — M351 Other overlap syndromes: Secondary | ICD-10-CM

## 2023-04-22 ENCOUNTER — Other Ambulatory Visit: Payer: Self-pay | Admitting: Cardiovascular Disease

## 2023-05-21 ENCOUNTER — Other Ambulatory Visit: Payer: Self-pay | Admitting: Cardiovascular Disease

## 2023-05-21 ENCOUNTER — Other Ambulatory Visit: Payer: Self-pay | Admitting: Physician Assistant

## 2023-05-21 MED ORDER — AMOXICILLIN 500 MG PO TABS: ORAL_TABLET | ORAL | 6 refills | Status: DC

## 2023-05-21 NOTE — Telephone Encounter (Signed)
Pt is requesting a refill on amoxicillin. Would Dr. Flora Lipps like to refill this medication for a dental appt? Please address

## 2023-05-21 NOTE — Telephone Encounter (Signed)
*  STAT* If patient is at the pharmacy, call can be transferred to refill team.   1. Which medications need to be refilled? (please list name of each medication and dose if known)   amoxicillin (AMOXIL) 500 MG tablet      4. Which pharmacy/location (including street and city if local pharmacy) is medication to be sent to? [id number] - [place], [place] - [address] AT [address] OF [facility] [address]     5. Do they need a 30 day or 90 day supply? 14 day supply  Pt states she has a dental cleaning on Monday 05/25/23

## 2023-05-26 ENCOUNTER — Other Ambulatory Visit: Payer: Self-pay | Admitting: Cardiovascular Disease

## 2023-06-30 NOTE — Progress Notes (Signed)
Cardiology Office Note:   Date:  07/03/2023  NAME:  Katherine Waters    MRN: [id number] DOB:  [birth year]   PCP:  Carilyn Goodpasture, NP  Cardiologist:  Reatha Harps, MD  Electrophysiologist:  None   Referring MD: Carilyn Goodpasture, NP   Chief Complaint  Patient presents with   Follow-up    6 months.    History of Present Illness:   Katherine Waters is a 72 y.o. female with a hx of HFpEF, severe AS s/p TAVR, OSA, obesity, calcific MS, pHTN, aTach who presents for follow-up.   Discussed the use of AI scribe software for clinical note transcription with the patient, who gave verbal consent to proceed.  History of Present Illness   The patient, with a history of hypertension, diastolic heart failure, and rheumatoid arthritis, presents with shortness of breath, particularly during physical activity. The patient reports a significant weight loss of 28 pounds since the last visit, attributed to the use of Ozempic. The patient has not been exercising due to shortness of breath. The patient also mentions having a colonoscopy and a pulmonary function test recently. The patient is on multiple medications including Ozempic for weight loss, Jardiance for fluid balance, and spironolactone for fluid balance and heart protection. The patient also started Orencia for rheumatoid arthritis. The patient reports occasional "flutters" in the chest but does not have any other new symptoms. The patient also had a precancerous lesion removed from the leg recently.         Problem List 1. Aortic stenosis s/p TAVR -LHC: normal coronaries 01/29/2021 -29 mm Evolut Pro 02/12/2021 2. HTN 3. HLD 4. OSA, mild 5. COPD, mild 6. 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 Atrial tachycardia  8. HFpEF 9. Pulmonary hypertension -moderate mPAP 35 mmHG PCWP 22 mmHG -2/2 diastolic CHF 10.  Moderate calcific mitral valve stenosis -MG 8 mmHG 11. 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 Excell Seltzer and Dr. Cornelius Moras    Severe aortic stenosis    Sleep apnea    Mild, CPAP wore for one year    Past Surgical History: Past Surgical History:  Procedure Laterality Date   ABDOMINAL HYSTERECTOMY     CARDIAC CATHETERIZATION     CATARACT EXTRACTION, BILATERAL     COLONOSCOPY WITH ESOPHAGOGASTRODUODENOSCOPY (EGD) AND ESOPHAGEAL DILATION (ED)     2016   INTRAOPERATIVE TRANSTHORACIC ECHOCARDIOGRAM Left 02/12/2021   Procedure: INTRAOPERATIVE TRANSTHORACIC ECHOCARDIOGRAM;  Surgeon: Tonny Bollman, MD;  Location: [facility] OR;  Service: Open Heart Surgery;  Laterality: Left;   LAPAROSCOPIC BILATERAL SALPINGO OOPHERECTOMY Bilateral 04/02/2022   Procedure: LAPAROSCOPIC BILATERAL SALPINGO OOPHORECTOMY;  Surgeon: Gerald Leitz, MD;  Location: [facility] OR;  Service: Gynecology;  Laterality: Bilateral;   LUMBAR FUSION  2013   POSTERIOR TIBIAL  TENDON REPAIR Left 2019   RIGHT/LEFT HEART CATH AND CORONARY ANGIOGRAPHY N/A 01/29/2021   Procedure: RIGHT/LEFT HEART CATH AND CORONARY ANGIOGRAPHY;  Surgeon: Lyn Records, MD;  [facility]: [facility];  Service: Cardiovascular;  Laterality: N/A;   ROTATOR CUFF REPAIR Right    SHOULDER ARTHROSCOPY WITH DISTAL CLAVICLE RESECTION Left 12/09/2022   Procedure: SHOULDER ARTHROSCOPY WITH DISTAL CLAVICLE RESECTION AND SUBACROMIAL DECOMPRESSION,LABRAL DEBRIDEMENT;  Surgeon: Francena Hanly, MD;  Location: [facility];  Service:  Orthopedics;  Laterality: Left;  exparel block 120   SHOULDER ARTHROSCOPY WITH ROTATOR CUFF REPAIR Left 12/09/2022   Procedure: SHOULDER ARTHROSCOPY WITH DERIDEMENT AND  ROTATOR CUFF REPAIR;  Surgeon: Francena Hanly, MD;  Location: [facility];  Service: Orthopedics;  Laterality: Left;  exparel block 120   TRANSCATHETER AORTIC VALVE REPLACEMENT, TRANSFEMORAL N/A 02/12/2021   Procedure: TRANSCATHETER AORTIC VALVE REPLACEMENT, TRANSFEMORAL;  Surgeon: Tonny Bollman, MD;  Location: [facility] OR;  Service: Open Heart Surgery;  Laterality: N/A;   TUBAL LIGATION     ULTRASOUND GUIDANCE FOR VASCULAR ACCESS Bilateral 02/12/2021   Procedure: ULTRASOUND GUIDANCE FOR VASCULAR ACCESS;  Surgeon: Tonny Bollman, MD;  Location: [facility] OR;  Service: Open Heart Surgery;  Laterality: Bilateral;   WISDOM TOOTH EXTRACTION  1973    Current Medications: Current Meds  Medication Sig   Abatacept (ORENCIA IV) Inject into the vein every 30 (thirty) days.   acetaminophen (TYLENOL) 500 MG tablet Take 1,000 mg by mouth daily.  may take an additional dose (1000 mg) as needed for pain   amLODipine (NORVASC) 10 MG tablet Take 10 mg by mouth in the morning.   amoxicillin (AMOXIL) 500 MG tablet Take 4 tablets (2,000 mg total) one hour prior to all dental visits.   aspirin 81 MG chewable tablet Chew 1 tablet (81 mg total) by mouth daily.   DULoxetine (CYMBALTA) 60 MG capsule Take 60 mg by mouth in the morning.   indapamide (LOZOL) 2.5 MG tablet TAKE 2 TABLETS (5 MG TOTAL) BY MOUTH IN THE MORNING.   JARDIANCE 10 MG TABS tablet TAKE 1 TABLET BY MOUTH DAILY BEFORE BREAKFAST   omeprazole (PRILOSEC) 40 MG capsule Take 40 mg by mouth every morning.   PREBIOTIC PRODUCT PO Take 1 tablet by mouth daily.   pyridOXINE (VITAMIN B6) 100 MG tablet Take 200 mg by mouth daily.   Semaglutide (OZEMPIC, 1 MG/DOSE, [place]) Inject into the skin once a week.   spironolactone (ALDACTONE) 25 MG tablet TAKE 1 TABLET (25 MG TOTAL) BY MOUTH DAILY.   telmisartan  (MICARDIS) 80 MG tablet Take 80 mg by mouth in the morning.   traMADol (ULTRAM) 50 MG tablet Take 150 mg by mouth daily.   [DISCONTINUED] APPLE CIDER VINEGAR PO Take 2,400 mg by mouth daily.   [DISCONTINUED] Cholecalciferol (DIALYVITE VITAMIN D 5000) 125 MCG (5000 UT) capsule Take 5,000 Units by mouth in the morning.   [DISCONTINUED] Cholecalciferol (VITAMIN D-3) 125 MCG (5000 UT) TABS Take 5,000 Units by mouth daily.   [DISCONTINUED] Cyanocobalamin (B-12) 1000 MCG SUBL Place 1,000 mcg under the tongue daily.   [DISCONTINUED] MAGNESIUM GLUCONATE PO Take 1,000 mg by mouth daily.   [DISCONTINUED] Multiple Vitamins-Minerals (HAIR/SKIN/NAILS/BIOTIN) TABS Take 2,500 mcg by mouth in the morning.   [DISCONTINUED] TURMERIC-GINGER PO Take 1,950 mg by mouth daily. W /bioperine     Allergies:    Dobutamine, Nsaids, Latex, Sulfa antibiotics, and Tape   Social History: Social History   Socioeconomic History   Marital status: Married    Spouse name: Not  on file   Number of children: 1   Years of education: 12   Highest education level: High school graduate  Occupational History   Occupation: retired bookkeeper  Tobacco Use   Smoking status: Former    Current packs/day: 0.00    Average packs/day: 0.5 packs/day for 6.0 years (3.0 ttl pk-yrs)    Types: Cigarettes    Start date: 65    Quit date: 1976    Years since quitting: 48.7   Smokeless tobacco: Never  Vaping Use   Vaping status: Never Used  Substance and Sexual Activity   Alcohol use: Yes    Alcohol/week: 1.0 standard drink of alcohol    Types: 1 Shots of liquor per week    Comment: occ   Drug use: Not Currently   Sexual activity: Not on file  Other Topics Concern   Not on file  Social History Narrative   Not on file   Social Determinants of Health   Financial Resource Strain: Not on file  Food Insecurity: Not on file  Transportation Needs: Not on file  Physical Activity: Not on file  Stress: Not on file  Social  Connections: Not on file     Family History: The patient's family history includes COPD in her mother; Cancer in her sister; Cirrhosis in her brother; Heart attack (age of onset: 30) in her father; Heart disease in her brother.  ROS:   All other ROS reviewed and negative. Pertinent positives noted in the HPI.     EKGs/Labs/Other Studies Reviewed:   The following studies were personally reviewed by me today:  EKG:  EKG is ordered today.    EKG Interpretation Date/Time:  Friday July 03 2023 09:01:11 EDT Ventricular Rate:  80 PR Interval:  156 QRS Duration:  96 QT Interval:  398 QTC Calculation: 459 R Axis:   -1  Text Interpretation: Normal sinus rhythm Low voltage QRS Confirmed by Lennie Odor [id number]) on 07/03/2023 9:13:43 AM   Recent Labs: 12/09/2022: BUN 21; Creatinine, Ser 0.96; Hemoglobin 15.4; Platelets 264; Potassium 3.7; Sodium 135   Recent Lipid Panel    Component Value Date/Time   CHOL 163 01/10/2021 1050   TRIG 98 01/10/2021 1050   HDL 70 01/10/2021 1050   CHOLHDL 2.3 01/10/2021 1050   LDLCALC 75 01/10/2021 1050    Physical Exam:   VS:  BP (!) 104/54 (BP Location: Left Arm, Patient Position: Sitting, Cuff Size: Large)   Pulse 80   Ht 5\' 2"  (1.575 m)   Wt 220 lb (99.8 kg)   BMI 40.24 kg/m    Wt Readings from Last 3 Encounters:  07/03/23 220 lb (99.8 kg)  01/08/23 248 lb 6.4 oz (112.7 kg)  12/09/22 245 lb (111.1 kg)    General: Well nourished, well developed, in no acute distress Head: Atraumatic, normal size  Eyes: PEERLA, EOMI  Neck: Supple, no JVD Endocrine: No thryomegaly Cardiac: Normal S1, S2; RRR; 2/6 SEM Lungs: Clear to auscultation bilaterally, no wheezing, rhonchi or rales  Abd: Soft, nontender, no hepatomegaly  Ext: No edema, pulses 2+ Musculoskeletal: No deformities, BUE and BLE strength normal and equal Skin: Warm and dry, no rashes   Neuro: Alert and oriented to person, place, time, and situation, CNII-XII grossly intact, no focal  deficits  Psych: Normal mood and affect   ASSESSMENT:   Devonia Cajina is a 72 y.o. female who presents for the following: 1. Chronic diastolic heart failure (HCC)   2. Pulmonary hypertension, unspecified (HCC)  3. SOB (shortness of breath)   4. Nonrheumatic mitral valve stenosis   5. S/P TAVR (transcatheter aortic valve replacement)   6. Primary hypertension   7. Obesity, morbid, BMI 40.0-49.9 (HCC)     PLAN:   Assessment and Plan    Obesity Significant weight loss of 28 pounds since last visit, likely secondary to Ozempic use. -Continue Ozempic. -Encouraged to increase physical activity, starting with walking.  Shortness of Breath Persistent, but slightly improved. Possible multifactorial etiology including deconditioning, mild COPD, and diastolic heart failure. -Refer to pulmonologist for further evaluation and management. -Encouraged to gradually increase physical activity as tolerated.  Diastolic Heart Failure Stable, managed with Jardiance and Spironolactone for fluid balance and heart protection. -Continue current medications. -Encouraged to monitor blood pressure at home and report any significant changes. -no need for lasix   TAVR -stable. Continue ASA and Abx prophylaxis    Calcific Mitral stenosis -mild on my review   pHTN -2/2 dCHF. Volume stable. COPD noted. Recommend exercise as well. Volume status quite acceptable.   Rheumatoid Arthritis On Orencia, but not providing significant pain relief. -No changes to current management discussed in this visit.  Follow-up in 6 months.           Disposition: Return in about 6 months (around 12/31/2023).  Medication Adjustments/Labs and Tests Ordered: Current medicines are reviewed at length with the patient today.  Concerns regarding medicines are outlined above.  Orders Placed This Encounter  Procedures   Ambulatory referral to Pulmonology   EKG 12-Lead   No orders of the defined types were placed in  this encounter.  Patient Instructions  Medication Instructions:  Your physician recommends that you continue on your current medications as directed. Please refer to the Current Medication list given to you today.   *If you need a refill on your cardiac medications before your next appointment, please call your pharmacy*      Follow-Up: At [facility], you and your health needs are our priority.  As part of our continuing mission to provide you with exceptional heart care, we have created designated Provider Care Teams.  These Care Teams include your primary Cardiologist (physician) and Advanced Practice Providers (APPs -  Physician Assistants and Nurse Practitioners) who all work together to provide you with the care you need, when you need it.  We recommend signing up for the patient portal called "MyChart".  Sign up information is provided on this After Visit Summary.  MyChart is used to connect with patients for Virtual Visits (Telemedicine).  Patients are able to view lab/test results, encounter notes, upcoming appointments, etc.  Non-urgent messages can be sent to your provider as well.   To learn more about what you can do with MyChart, go to [email or web address].    Your next appointment:   6 month(s)  The format for your next appointment:   In Person  Provider:   Reatha Harps, MD    Other Instructions Dr. Scharlene Gloss has referred you to [facility] Pulmonology.    Time Spent with Patient: I have spent a total of 35 minutes with patient reviewing hospital notes, telemetry, EKGs, labs and examining the patient as well as establishing an assessment and plan that was discussed with the patient.  > 50% of time was spent in direct patient care.  Signed, Lenna Gilford. Flora Lipps, MD, [facility]  [facility]  [address] [place], [place] [address] [phone]  07/03/2023 9:39 AM

## 2023-07-01 ENCOUNTER — Ambulatory Visit (HOSPITAL_COMMUNITY)
Admission: RE | Admit: 2023-07-01 | Discharge: 2023-07-01 | Disposition: A | Discharge status: Home / Self Care | Payer: Medicare Other | Source: Ambulatory Visit | Attending: Internal Medicine | Admitting: Internal Medicine

## 2023-07-01 DIAGNOSIS — R0609 Other forms of dyspnea: Secondary | ICD-10-CM | POA: Diagnosis not present

## 2023-07-01 DIAGNOSIS — Z87891 Personal history of nicotine dependence: Secondary | ICD-10-CM | POA: Insufficient documentation

## 2023-07-01 DIAGNOSIS — M351 Other overlap syndromes: Secondary | ICD-10-CM | POA: Diagnosis present

## 2023-07-01 LAB — PULMONARY FUNCTION TEST
DL/VA % pred: 76 %
DL/VA: 3.19 ml/min/mmHg/L
DLCO unc % pred: 73 %
DLCO unc: 13.51 ml/min/mmHg
FEF 25-75 Post: 2.01 L/s
FEF 25-75 Pre: 1.51 L/s
FEF2575-%Change-Post: 33 %
FEF2575-%Pred-Post: 119 %
FEF2575-%Pred-Pre: 89 %
FEV1-%Change-Post: 8 %
FEV1-%Pred-Post: 96 %
FEV1-%Pred-Pre: 89 %
FEV1-Post: 1.99 L
FEV1-Pre: 1.84 L
FEV1FVC-%Change-Post: 3 %
FEV1FVC-%Pred-Pre: 103 %
FEV6-%Change-Post: 5 %
FEV6-%Pred-Post: 95 %
FEV6-%Pred-Pre: 90 %
FEV6-Post: 2.49 L
FEV6-Pre: 2.37 L
FEV6FVC-%Pred-Post: 105 %
FEV6FVC-%Pred-Pre: 105 %
FVC-%Change-Post: 5 %
FVC-%Pred-Post: 91 %
FVC-%Pred-Pre: 86 %
FVC-Post: 2.49 L
FVC-Pre: 2.37 L
Post FEV1/FVC ratio: 80 %
Post FEV6/FVC ratio: 100 %
Pre FEV1/FVC ratio: 78 %
Pre FEV6/FVC Ratio: 100 %
RV % pred: 172 %
RV: 3.78 L
TLC % pred: 128 %
TLC: 6.25 L

## 2023-07-01 MED ORDER — ALBUTEROL SULFATE (2.5 MG/3ML) 0.083% IN NEBU
2.5000 mg | INHALATION_SOLUTION | Freq: Once | RESPIRATORY_TRACT | Status: AC
  Administered 2023-07-01: 2.5 mg via RESPIRATORY_TRACT

## 2023-07-03 ENCOUNTER — Encounter: Payer: Self-pay | Admitting: Cardiovascular Disease

## 2023-07-03 ENCOUNTER — Ambulatory Visit: Payer: Medicare Other | Attending: Cardiovascular Disease | Admitting: Cardiovascular Disease

## 2023-07-03 VITALS — BP 104/54 | HR 80 | Ht 62.0 in | Wt 220.0 lb

## 2023-07-03 DIAGNOSIS — I5032 Chronic diastolic (congestive) heart failure: Secondary | ICD-10-CM | POA: Diagnosis present

## 2023-07-03 DIAGNOSIS — Z952 Presence of prosthetic heart valve: Secondary | ICD-10-CM | POA: Diagnosis present

## 2023-07-03 DIAGNOSIS — I272 Pulmonary hypertension, unspecified: Secondary | ICD-10-CM | POA: Insufficient documentation

## 2023-07-03 DIAGNOSIS — R0602 Shortness of breath: Secondary | ICD-10-CM | POA: Insufficient documentation

## 2023-07-03 DIAGNOSIS — I10 Essential (primary) hypertension: Secondary | ICD-10-CM | POA: Diagnosis present

## 2023-07-03 DIAGNOSIS — I342 Nonrheumatic mitral (valve) stenosis: Secondary | ICD-10-CM | POA: Diagnosis present

## 2023-07-03 NOTE — Patient Instructions (Signed)
Medication Instructions:  Your physician recommends that you continue on your current medications as directed. Please refer to the Current Medication list given to you today.   *If you need a refill on your cardiac medications before your next appointment, please call your pharmacy*      Follow-Up: At [facility], you and your health needs are our priority.  As part of our continuing mission to provide you with exceptional heart care, we have created designated Provider Care Teams.  These Care Teams include your primary Cardiologist (physician) and Advanced Practice Providers (APPs -  Physician Assistants and Nurse Practitioners) who all work together to provide you with the care you need, when you need it.  We recommend signing up for the patient portal called "MyChart".  Sign up information is provided on this After Visit Summary.  MyChart is used to connect with patients for Virtual Visits (Telemedicine).  Patients are able to view lab/test results, encounter notes, upcoming appointments, etc.  Non-urgent messages can be sent to your provider as well.   To learn more about what you can do with MyChart, go to [email or web address].    Your next appointment:   6 month(s)  The format for your next appointment:   In Person  Provider:   Reatha Harps, MD    Other Instructions Dr. Scharlene Gloss has referred you to [facility].

## 2023-07-08 ENCOUNTER — Encounter (HOSPITAL_COMMUNITY): Payer: Medicare Other

## 2023-07-08 ENCOUNTER — Encounter (HOSPITAL_COMMUNITY): Payer: Medicare Other | Admitting: Cardiology

## 2023-08-03 ENCOUNTER — Encounter (HOSPITAL_COMMUNITY): Payer: Medicare Other | Admitting: Cardiology

## 2023-08-20 ENCOUNTER — Ambulatory Visit
Admission: RE | Admit: 2023-08-20 | Discharge: 2023-08-20 | Disposition: A | Discharge status: Home / Self Care | Payer: Medicare Other | Source: Ambulatory Visit | Attending: Family Medicine | Admitting: Family Medicine

## 2023-08-20 DIAGNOSIS — Z1231 Encounter for screening mammogram for malignant neoplasm of breast: Secondary | ICD-10-CM

## 2023-08-20 DIAGNOSIS — Z13820 Encounter for screening for osteoporosis: Secondary | ICD-10-CM

## 2023-08-24 ENCOUNTER — Other Ambulatory Visit: Payer: Self-pay | Admitting: Cardiovascular Disease

## 2023-09-23 ENCOUNTER — Other Ambulatory Visit (HOSPITAL_COMMUNITY): Payer: Self-pay

## 2023-09-23 ENCOUNTER — Encounter (HOSPITAL_BASED_OUTPATIENT_CLINIC_OR_DEPARTMENT_OTHER): Payer: Self-pay | Admitting: Pulmonary Disease

## 2023-09-23 ENCOUNTER — Ambulatory Visit (HOSPITAL_BASED_OUTPATIENT_CLINIC_OR_DEPARTMENT_OTHER): Payer: Medicare Other | Admitting: Pulmonary Disease

## 2023-09-23 VITALS — BP 122/68 | HR 83 | Resp 16 | Ht 62.0 in | Wt 213.4 lb

## 2023-09-23 DIAGNOSIS — R29898 Other symptoms and signs involving the musculoskeletal system: Secondary | ICD-10-CM

## 2023-09-23 DIAGNOSIS — M359 Systemic involvement of connective tissue, unspecified: Secondary | ICD-10-CM | POA: Diagnosis not present

## 2023-09-23 DIAGNOSIS — J8410 Pulmonary fibrosis, unspecified: Secondary | ICD-10-CM

## 2023-09-23 DIAGNOSIS — J4530 Mild persistent asthma, uncomplicated: Secondary | ICD-10-CM

## 2023-09-23 MED ORDER — ADVAIR HFA 115-21 MCG/ACT IN AERO: 2.0000 | INHALATION_SPRAY | Freq: Two times a day (BID) | RESPIRATORY_TRACT | 5 refills | Status: DC

## 2023-09-23 MED ORDER — ALBUTEROL SULFATE HFA 108 (90 BASE) MCG/ACT IN AERS: 2.0000 | INHALATION_SPRAY | Freq: Four times a day (QID) | RESPIRATORY_TRACT | 2 refills | Status: AC | PRN

## 2023-09-23 NOTE — Patient Instructions (Addendum)
--  ORDER CT Chest without contrast --ORDER [facility] --START Advair HFA 115-21 mcg TWO puffs in the morning and evening. Rinse mouth out after use --START Albuterol AS NEEDED for shortness of breath. OK to use 20 min before exercise

## 2023-09-23 NOTE — Progress Notes (Signed)
Subjective:   PATIENT ID: Katherine Waters GENDER: female DOB: 01/03/1951, MRN: [id number]  Chief Complaint  Patient presents with   Consult    SOB on exertion for many years, it is no better and no worse than when started. Had PFT in Sept and both cardio and PCP said she needed to see you. Has not gotten results on this test.     Reason for Visit: New consult for shortness of breath  This note is generated using [employer]bridge programming. Patient/family has given consent.  HPI  Discussed the use of AI scribe software for clinical note transcription with the patient, who gave verbal consent to proceed.  History of Present Illness   [name] is a a 72 year old with a history of AS s/p TAVR, hypertension, chronic diastolic heart failure, rheumatoid arthritis on biologics, and mixed connective tissue disease, presents with persistent shortness of breath. The patient was referred by her cardiologist for further evaluation with pulmonology.  The patient has been experiencing shortness of breath for several years, which has not improved despite weight loss and aortic valve replacement (TAVR) in April 2022. The shortness of breath is primarily experienced during physical activities such as walking and climbing stairs, and is not associated with wheezing or coughing. The patient has a history of asthma since childhood, but has not been on inhalers for over three years as she did not find them effective.  The patient also reports arthritic pain, which along with the shortness of breath, has limited her ability to exercise. She participated in cardiac rehabilitation a year after her TAVR procedure, but did not find it helpful for her breathing. The patient has not had any respiratory illnesses for several years.      Social History: Social smoker with negligible smoking hisotyr   I have personally reviewed patient's past medical/family/social history, allergies, current medications.  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 Excell Seltzer and Dr. Cornelius Moras    Severe aortic stenosis    Sleep apnea    Mild, CPAP wore for one year     Family History  Problem Relation Age of Onset   COPD Mother    Heart attack Father 18   Cancer Sister    Cirrhosis Brother    Heart disease Brother      Social History   Occupational History   Occupation: retired Catering manager  Tobacco Use   Smoking status: Former    Current packs/day: 0.00    Average packs/day: 0.5 packs/day for 6.0 years (3.0 ttl pk-yrs)    Types: Cigarettes    Start date: 16    Quit date: 1976    Years since quitting: 48.9    Passive exposure: Past   Smokeless tobacco: Never  Vaping Use   Vaping status: Never Used  Substance and Sexual Activity   Alcohol use: Yes    Alcohol/week: 1.0 standard drink of alcohol    Types: 1 Shots of liquor per week    Comment: occ   Drug use: Not Currently   Sexual activity: Not on file    Allergies  Allergen Reactions   Dobutamine     Decreases heart rate    Nsaids     Elevated kidney levels recommended to avoid NSAIDS   Latex Rash   Sulfa Antibiotics Rash   Tape Rash    Adhesive/Band-aid     Outpatient Medications Prior to Visit  Medication Sig Dispense Refill   acetaminophen (TYLENOL) 500 MG tablet Take 1,000 mg by mouth daily.  may take an additional dose (1000 mg) as needed for pain     amLODipine (NORVASC) 10 MG tablet Take  10 mg by mouth in the morning.     amoxicillin (AMOXIL) 500 MG tablet Take 4 tablets (2,000 mg total) one hour prior to all dental visits. 12 tablet 6   aspirin 81 MG chewable tablet Chew 1 tablet (81 mg total) by mouth daily.     DULoxetine (CYMBALTA) 60 MG capsule Take 60 mg by mouth in the morning.     indapamide (LOZOL) 2.5 MG tablet TAKE 2 TABLETS (5 MG TOTAL) BY MOUTH IN THE MORNING. 180 tablet 2   JARDIANCE 10 MG TABS tablet TAKE 1 TABLET BY MOUTH DAILY BEFORE BREAKFAST 30 tablet 11   loratadine (CLARITIN) 10 MG tablet Take 10 mg by mouth daily.     omeprazole (PRILOSEC) 40 MG capsule Take 40 mg by mouth every morning.     PREBIOTIC PRODUCT PO Take 1 tablet by mouth daily.     pyridOXINE (VITAMIN B6) 100 MG tablet Take 200 mg by mouth daily.     Semaglutide (OZEMPIC, 1 MG/DOSE, [place]) Inject 2 mg into the skin once a week.     spironolactone (ALDACTONE) 25 MG tablet TAKE 1 TABLET (25 MG TOTAL) BY MOUTH DAILY. 90 tablet 2   telmisartan (MICARDIS) 80 MG tablet Take 80 mg by mouth in the morning.     Abatacept (ORENCIA IV) Inject into the vein every 30 (thirty) days.     traMADol (ULTRAM) 50 MG tablet Take 150 mg by mouth daily.     No facility-administered medications prior to visit.    Review of Systems  Constitutional:  Negative for chills, diaphoresis, fever, malaise/fatigue and weight loss.  HENT:  Negative for congestion.   Respiratory:  Positive for shortness of breath. Negative for cough, hemoptysis, sputum production and wheezing.   Cardiovascular:  Negative for chest pain, palpitations and leg swelling.     Objective:   Vitals:   09/23/23 0908  BP: 122/68  Pulse: 83  Resp: 16  SpO2: 97%  Weight: 213 lb 6.4 oz (96.8 kg)  Height: 5\' 2"  (1.575 m)   SpO2: 97 %  Physical Exam: General: Well-appearing, no acute distress HENT: [place], AT Eyes: EOMI, no scleral icterus Respiratory: Clear to auscultation bilaterally.  No crackles, wheezing or rales Cardiovascular: RRR,  -M/R/G, no JVD Extremities:-Edema,-tenderness Neuro: AAO x4, CNII-XII grossly intact Psych: Normal mood, normal affect  Data Reviewed:  Imaging: CXR 02/08/21 - Basilar atelectasis  PFT: 07/01/23 FVC 2.49 (91%) FEV1 1.99 (96%) Ratio 78  TLC 128%  RV 172% DLCO 73%. Partial bronchodilator response however not significant. Interpretation: Normal spirometry. Hyperinflation with air trapping present but unclear significance in absence of obstruction. Mildly reduced DLCO  Labs:  Latest Ref Rng & Units 12/09/2022    3:45 PM 04/02/2022   11:51 AM 02/13/2021    1:48 AM  CBC  WBC 4.0 - 10.5 K/uL 8.9  7.7  9.0   Hemoglobin 12.0 - 15.0 g/dL 21.3  08.6  57.8   Hematocrit 36.0 - 46.0 % 45.9  47.0  36.2   Platelets 150 - 400 K/uL 264  260  201       Latest Ref Rng & Units 12/09/2022    3:45 PM 04/14/2022    9:50 AM 04/02/2022   11:51 AM  CMP  Glucose 70 - 99 mg/dL 469  629  528   BUN 8 - 23 mg/dL 21  26  22    Creatinine 0.44 - 1.00 mg/dL 4.13  2.44  0.10   Sodium 135 - 145 mmol/L 135  141  136   Potassium 3.5 - 5.1 mmol/L 3.7  5.0  3.9   Chloride 98 - 111 mmol/L 97  100  101   CO2 22 - 32 mmol/L 24  27  23    Calcium 8.9 - 10.3 mg/dL 9.5  27.2  9.9        Assessment & Plan:   Discussion: 72 year old female with AS s/p TAVR, HTN, OSA, RA on biologics, secondary pulmonary hypertension, morbid obesity who presents for evaluation for shortness of breath. Reviewed PFTs with no obstructive defect but air trapping and mildly reduced DLCO  Assessment and Plan    Chronic Shortness of Breath Persistent despite TAVR procedure in 2022. No improvement with cardiac rehab. No associated wheezing or coughing. PFTs show hyperinflation and air trapping suggestive of underlying asthma. -Order CT scan of chest to assess for potential lung scarring. -Start maintenance inhaler and rescue inhaler pending pharmacist's price check. -Consider right heart catheterization if CT scan shows significant  scarring.  Mixed Connective Tissue Disease History of diagnosis with potential impact on lung function. -Monitor closely in conjunction with rheumatologist.  Physical Deconditioning Limited exercise due to arthritic pain and shortness of breath. -Refer for water aerobics therapy for low-impact exercise.  Follow-up Pending results of CT scan and response to inhaler therapy.      Health Maintenance Immunization History  Administered Date(s) Administered   Influenza-Unspecified 09/07/2023   CT Lung Screen - not qualified  Orders Placed This Encounter  Procedures   Ambulatory referral to Physical Therapy    Referral Priority:   Routine    Referral Type:   Physical Medicine    Referral Reason:   Specialty Services Required    Requested Specialty:   Physical Therapy    Number of Visits Requested:   1  No orders of the defined types were placed in this encounter.   Return in about 3 months (around 12/22/2023).  I have spent a total time of 45-minutes on the day of the appointment reviewing prior documentation, coordinating care and discussing medical diagnosis and plan with the patient/family. Imaging, labs and tests included in this note have been reviewed and interpreted independently by me.  Kapri Nero Mechele Collin, MD [facility] 09/23/2023 10:13 AM

## 2023-09-24 ENCOUNTER — Telehealth (HOSPITAL_BASED_OUTPATIENT_CLINIC_OR_DEPARTMENT_OTHER): Payer: Self-pay | Admitting: Pulmonary Disease

## 2023-10-22 ENCOUNTER — Ambulatory Visit (HOSPITAL_BASED_OUTPATIENT_CLINIC_OR_DEPARTMENT_OTHER)
Admission: RE | Admit: 2023-10-22 | Discharge: 2023-10-22 | Disposition: A | Discharge status: Home / Self Care | Payer: Medicare Other | Source: Ambulatory Visit | Attending: Pulmonary Disease | Admitting: Pulmonary Disease

## 2023-10-22 DIAGNOSIS — J8410 Pulmonary fibrosis, unspecified: Secondary | ICD-10-CM | POA: Insufficient documentation

## 2023-10-22 DIAGNOSIS — M359 Systemic involvement of connective tissue, unspecified: Secondary | ICD-10-CM | POA: Diagnosis present

## 2023-10-28 ENCOUNTER — Encounter (HOSPITAL_BASED_OUTPATIENT_CLINIC_OR_DEPARTMENT_OTHER): Payer: Self-pay | Admitting: Physical Therapy

## 2023-10-28 ENCOUNTER — Other Ambulatory Visit: Payer: Self-pay

## 2023-10-28 ENCOUNTER — Ambulatory Visit (HOSPITAL_BASED_OUTPATIENT_CLINIC_OR_DEPARTMENT_OTHER): Payer: Medicare Other | Attending: Pulmonary Disease | Admitting: Physical Therapy

## 2023-10-28 DIAGNOSIS — R293 Abnormal posture: Secondary | ICD-10-CM | POA: Insufficient documentation

## 2023-10-28 DIAGNOSIS — M5459 Other low back pain: Secondary | ICD-10-CM | POA: Insufficient documentation

## 2023-10-28 DIAGNOSIS — R29898 Other symptoms and signs involving the musculoskeletal system: Secondary | ICD-10-CM | POA: Insufficient documentation

## 2023-10-28 DIAGNOSIS — M6281 Muscle weakness (generalized): Secondary | ICD-10-CM | POA: Diagnosis present

## 2023-10-28 DIAGNOSIS — R2681 Unsteadiness on feet: Secondary | ICD-10-CM | POA: Insufficient documentation

## 2023-10-28 NOTE — Therapy (Signed)
 OUTPATIENT PHYSICAL THERAPY THORACOLUMBAR EVALUATION   Patient Name: Katherine Waters MRN: [id number] DOB:04/02/1951, 73 y.o., female Today's Date: 10/28/2023  END OF SESSION:  PT End of Session - 10/28/23 1818     Visit Number 1    Number of Visits --    Date for PT Re-Evaluation 12/25/23    Authorization Type mcr    Progress Note Due on Visit 10    PT Start Time 1632    PT Stop Time 1715    PT Time Calculation (min) 43 min    Activity Tolerance Patient tolerated treatment well    Behavior During Therapy WFL for tasks assessed/performed             Past Medical History:  Diagnosis Date   Arthritis    osteoarthritis   Asthma    Seasonal   Connective tissue disease (HCC)    CREST syndrome (HCC)    Depression    Diabetes 1.5, managed as type 2 (HCC)    Dyspnea    Fibromyalgia    GERD (gastroesophageal reflux disease)    History of migraine headaches    Horseshoe kidney    Hyperlipidemia    Hypertension    Inflammatory arthritis    PONV (postoperative nausea and vomiting)    Raynaud disease    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 Wonda and Dr. Dusty    Severe aortic stenosis    Sleep apnea    Mild, CPAP wore for one year   Past Surgical History:  Procedure Laterality Date   ABDOMINAL HYSTERECTOMY     BREAST BIOPSY Right    CARDIAC CATHETERIZATION     CATARACT EXTRACTION, BILATERAL     COLONOSCOPY WITH ESOPHAGOGASTRODUODENOSCOPY (EGD) AND ESOPHAGEAL DILATION (ED)     2016   INTRAOPERATIVE TRANSTHORACIC ECHOCARDIOGRAM Left 02/12/2021   Procedure: INTRAOPERATIVE TRANSTHORACIC ECHOCARDIOGRAM;  Surgeon: Wonda Sharper, MD;  Location: [facility] OR;  Service: Open Heart Surgery;  Laterality: Left;   LAPAROSCOPIC BILATERAL SALPINGO OOPHERECTOMY Bilateral 04/02/2022   Procedure: LAPAROSCOPIC BILATERAL SALPINGO OOPHORECTOMY;  Surgeon: Rosalva Sawyer, MD;  Location: [facility] OR;  Service:  Gynecology;  Laterality: Bilateral;   LUMBAR FUSION  2013   POSTERIOR TIBIAL TENDON REPAIR Left 2019   RIGHT/LEFT HEART CATH AND CORONARY ANGIOGRAPHY N/A 01/29/2021   Procedure: RIGHT/LEFT HEART CATH AND CORONARY ANGIOGRAPHY;  Surgeon: Claudene Victory ORN, MD;  Location: [facility] INVASIVE CV LAB;  Service: Cardiovascular;  Laterality: N/A;   ROTATOR CUFF REPAIR Right    SHOULDER ARTHROSCOPY WITH DISTAL CLAVICLE RESECTION Left 12/09/2022   Procedure: SHOULDER ARTHROSCOPY WITH DISTAL CLAVICLE RESECTION AND SUBACROMIAL DECOMPRESSION,LABRAL DEBRIDEMENT;  Surgeon: Melita Drivers, MD;  Location: [facility] ORS;  Service: Orthopedics;  Laterality: Left;  exparel  block 120   SHOULDER ARTHROSCOPY WITH ROTATOR CUFF REPAIR Left 12/09/2022   Procedure: SHOULDER ARTHROSCOPY WITH DERIDEMENT AND  ROTATOR CUFF REPAIR;  Surgeon: Melita Drivers, MD;  Location: [facility] ORS;  Service: Orthopedics;  Laterality: Left;  exparel  block 120   TRANSCATHETER AORTIC VALVE REPLACEMENT, TRANSFEMORAL N/A 02/12/2021   Procedure: TRANSCATHETER AORTIC VALVE REPLACEMENT, TRANSFEMORAL;  Surgeon: Wonda Sharper, MD;  Location: [facility] OR;  Service: Open Heart Surgery;  Laterality: N/A;   TUBAL LIGATION     ULTRASOUND GUIDANCE FOR VASCULAR ACCESS Bilateral 02/12/2021   Procedure: ULTRASOUND GUIDANCE FOR VASCULAR ACCESS;  Surgeon: Wonda Sharper, MD;  Location: [facility] OR;  Service: Open Heart Surgery;  Laterality: Bilateral;  WISDOM TOOTH EXTRACTION  1973   Patient Active Problem List   Diagnosis Date Noted   Ovarian mass, left 04/02/2022   Transient complete heart block (HCC) 02/13/2021   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   Raynaud disease    GERD (gastroesophageal reflux disease)    Fibromyalgia    Depression    Severe aortic stenosis    Hypertension    Connective tissue disease (HCC)    Hyperlipidemia    Asthma     PCP: bROOK hESTER np  REFERRING PROVIDER: Kassie Acquanetta Bradley, MD   REFERRING DIAG: R29.898 (ICD-10-CM) -  Muscular deconditioning   Rationale for Evaluation and Treatment: Rehabilitation  THERAPY DIAG:  Muscle weakness (generalized)  Abnormal posture  Unsteadiness on feet  Other low back pain  ONSET DATE: 3 years  SUBJECTIVE:                                                                                                                                                                                           SUBJECTIVE STATEMENT: I have not been moving for the last 3 years due to multiple issues.  My back pain creeps up every once in a while, had an ablation 2 years ago, also fusion Lumbar spine in 2013. I am in the process of getting another ablation.  I have trouble standing and walking due to the back pain.  Bilateral shoulder surgeries in past that still bother me. Have lost 40 lb which has helped. Have problems with breathing.  PERTINENT HISTORY:  AS s/p TAVR, hypertension, chronic diastolic heart failure, rheumatoid arthritis on biologics, and mixed connective tissue disease, chronic shortness of breath  -Diffuse/interstitial lung disease, shortness of breath, mixed tissue connective tissue, Autoimmune disease, Fibrosis lung   PAIN:  Are you having pain? Yes: NPRS scale: current 3/10; worst 7/10 least 3/10  Pain location: LB and left buttock, shoudlers, hands Pain description: ache Aggravating factors: moving, walking standing Relieving factors: non movement  PRECAUTIONS: Fall Chronic SOB  RED FLAGS: None   WEIGHT BEARING RESTRICTIONS: No  FALLS:  Has patient fallen in last 6 months? No  LIVING ENVIRONMENT: Lives with: husband Lives in: House/apartment Stairs: Yes: External: 2 steps; on right going up Has following equipment at home: Single point cane  OCCUPATION: retired  PLOF: Independent  PATIENT GOALS: walk for exercise (cardiologist); going on vacation where I have to walk some  NEXT MD VISIT: as needed  OBJECTIVE:  Note: Objective measures were  completed at Evaluation unless otherwise noted.  DIAGNOSTIC FINDINGS:  N/a  PATIENT SURVEYS:  FOTO completed but did not save (??)  COGNITION: Overall  cognitive status: Within functional limits for tasks assessed      MUSCLE LENGTH:  wfl  POSTURE: increased thoracic kyphosis and weight shift right slight bilat knee valgus   LUMBAR ROM:   AROM eval  Flexion full  Extension full  Right lateral flexion 25% limited  Left lateral flexion 25% limited  Right rotation   Left rotation    (Blank rows = not tested)  LOWER EXTREMITY ROM:     wfl  LOWER EXTREMITY MMT:    MMT Right eval Left eval  Hip flexion 42.7 44.9  Hip extension    Hip abduction 31.2 36.0  Hip adduction    Hip internal rotation    Hip external rotation    Knee flexion    Knee extension 33.1 35.1  Ankle dorsiflexion    Ankle plantarflexion    Ankle inversion    Ankle eversion     (Blank rows = not tested)   FUNCTIONAL TESTS:  5 times sit to stand: 15.47 Timed up and go (TUG): 13.17 4 stage: NBOS x 10 s; unable to complete semi-tandem, tandem or SLS Berg: 32/56  GAIT: Distance walked: 400 ft Assistive device utilized: None Level of assistance: Complete Independence Comments:  a ntalgic, increased stance during swing bilat  TREATMENT  Eval                                                                                                                        Next session: complete LEFS   PATIENT EDUCATION:  Education details: Discussed eval findings, rehab rationale, aquatic program progression/POC and pools in area. Patient is in agreement  Person educated: Patient Education method: Explanation Education comprehension: verbalized understanding  HOME EXERCISE PROGRAM: tba  ASSESSMENT:  CLINICAL IMPRESSION: Patient is a 73 y.o. f who was seen today for physical therapy evaluation and treatment for generalized weakness. She has an extensive Pmhx which includes RA, LB surgeries, lung  dysfunction and CHF.  She recently has had an exacerbation of LBP which she will be receiving an ablation.  She has had bilateral shoulder surgeries.  Her muscle weakness therefore is multifactorial.  She reports lack of exercise/mobility for the past 3 years and has a goal to increase her toleration to activity and improve strength to be able to be more active and travel. She does state that her breathing is her most limiting issue with toleration to activity.  She is a good candidate for skilled PT to improve all areas of deficit.  She will begin with aquatic only intervention to demonstrate that due to her dysfunctions she will tolerate the water medium well to meet goals.  OBJECTIVE IMPAIRMENTS: Abnormal gait, decreased activity tolerance, decreased balance, decreased endurance, decreased mobility, difficulty walking, decreased strength, impaired flexibility, postural dysfunction, obesity, and pain.   ACTIVITY LIMITATIONS: standing, stairs, and locomotion level  PARTICIPATION LIMITATIONS: cleaning, laundry, shopping, community activity, and yard work programme researcher, broadcasting/film/video  PERSONAL FACTORS: Past/current experiences, Time since onset of injury/illness/exacerbation, and 3+ comorbidities: see  PmHx  are also affecting patient's functional outcome.   REHAB POTENTIAL: Good  CLINICAL DECISION MAKING: Evolving/moderate complexity  EVALUATION COMPLEXITY: Moderate   GOALS: Goals reviewed with patient? Yes  SHORT TERM GOALS: Target date: 12/11/23  Pt will tolerate full aquatic sessions consistently without increase in pain and with improving function to demonstrate good toleration and effectiveness of intervention.  Baseline: Goal status: INITIAL  2.  Pt will tolerate walking to and from setting (539ft ea way), engaging in aquatic therapy session without limiting pain or fatigue Baseline: TBD Goal status: INITIAL    LONG TERM GOALS: Target date: 12/25/23  Pt to improve on LEFS by at least 9 points to  demonstrate improved functional ability Baseline:  Goal status: INITIAL  2.  Pt will improve on Berg balance test to >/= 50/56 to demonstrate a decrease in fall risk. Baseline: 32/56 Goal status: INITIAL  3.  Pt will be indep with final aquatic and land (as approp)HEP for continued management of condition with verbal plan on consistency (gym and/or pool access vs indep at home). Baseline: none Goal status: INITIAL  4.  Pt will improve on 5 X STS test to <or= 12s  to demonstrate improving functional lower extremity strength, transitional movements, and balance Baseline: 15.47 Goal status: INITIAL  5.  Pt to acknowledge ability to tolerate/plan a vacation  Baseline: too fatigable to tolerate Goal status: INITIAL  6.  Pt will improve strength in all areas listed by at least 5 lbs to demonstrate improved overall physical function Baseline: see chart Goal status: INITIAL  PLAN:  PT FREQUENCY: 2x/week  PT DURATION: 8 weeks  PLANNED INTERVENTIONS: [id number]- PT Re-evaluation, 97110-Therapeutic exercises, 97530- Therapeutic activity, 97112- Neuromuscular re-education, 97535- Self Care, [id number]- Manual therapy, [id number]- Gait training, [phone]- Orthotic Fit/training, [phone]- Aquatic Therapy, [phone]- Ionotophoresis 4mg /ml Dexamethasone , Patient/Family education, Balance training, Stair training, Taping, Dry Needling, DME instructions, Cryotherapy, and Moist heat.  PLAN FOR NEXT SESSION: Aquatic only: general strengthening.Gentle ROM/stretching; balance retraining, core stability   Ronal Foots) Sommer Spickard MPT 10/28/23 6:30 PM [facility] [address] [place], [place], [address] Phone: [phone]   Fax:  [phone]

## 2023-11-11 ENCOUNTER — Encounter (HOSPITAL_BASED_OUTPATIENT_CLINIC_OR_DEPARTMENT_OTHER): Payer: Self-pay | Admitting: Pulmonary Disease

## 2023-11-18 ENCOUNTER — Ambulatory Visit (HOSPITAL_BASED_OUTPATIENT_CLINIC_OR_DEPARTMENT_OTHER): Payer: Medicare Other | Admitting: Physical Therapy

## 2023-11-18 ENCOUNTER — Encounter (HOSPITAL_BASED_OUTPATIENT_CLINIC_OR_DEPARTMENT_OTHER): Payer: Self-pay | Admitting: Physical Therapy

## 2023-11-18 DIAGNOSIS — M6281 Muscle weakness (generalized): Secondary | ICD-10-CM | POA: Diagnosis not present

## 2023-11-18 DIAGNOSIS — R2681 Unsteadiness on feet: Secondary | ICD-10-CM

## 2023-11-18 DIAGNOSIS — M5459 Other low back pain: Secondary | ICD-10-CM

## 2023-11-18 DIAGNOSIS — R293 Abnormal posture: Secondary | ICD-10-CM

## 2023-11-18 NOTE — Therapy (Signed)
OUTPATIENT PHYSICAL THERAPY THORACOLUMBAR TREATMENT   Patient Name: Makyiah Lie MRN: [id number] DOB:02-18-1951, 73 y.o., female Today's Date: 11/18/2023  END OF SESSION:  PT End of Session - 11/18/23 1129     Visit Number 2    Date for PT Re-Evaluation 12/25/23    Authorization Type mcr    Progress Note Due on Visit 10    PT Start Time 1117    PT Stop Time 1155    PT Time Calculation (min) 38 min    Activity Tolerance Patient tolerated treatment well    Behavior During Therapy WFL for tasks assessed/performed             Past Medical History:  Diagnosis Date   Arthritis    osteoarthritis   Asthma    Seasonal   Connective tissue disease (HCC)    CREST syndrome (HCC)    Depression    Diabetes 1.5, managed as type 2 (HCC)    Dyspnea    Fibromyalgia    GERD (gastroesophageal reflux disease)    History of migraine headaches    Horseshoe kidney    Hyperlipidemia    Hypertension    Inflammatory arthritis    PONV (postoperative nausea and vomiting)    Raynaud disease    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 Excell Seltzer and Dr. Cornelius Moras    Severe aortic stenosis    Sleep apnea    Mild, CPAP wore for one year   Past Surgical History:  Procedure Laterality Date   ABDOMINAL HYSTERECTOMY     BREAST BIOPSY Right    CARDIAC CATHETERIZATION     CATARACT EXTRACTION, BILATERAL     COLONOSCOPY WITH ESOPHAGOGASTRODUODENOSCOPY (EGD) AND ESOPHAGEAL DILATION (ED)     2016   INTRAOPERATIVE TRANSTHORACIC ECHOCARDIOGRAM Left 02/12/2021   Procedure: INTRAOPERATIVE TRANSTHORACIC ECHOCARDIOGRAM;  Surgeon: Tonny Bollman, MD;  Location: [facility];  Service: Open Heart Surgery;  Laterality: Left;   LAPAROSCOPIC BILATERAL SALPINGO OOPHERECTOMY Bilateral 04/02/2022   Procedure: LAPAROSCOPIC BILATERAL SALPINGO OOPHORECTOMY;  Surgeon: Gerald Leitz, MD;  Location: [facility];  Service: Gynecology;  Laterality:  Bilateral;   LUMBAR FUSION  2013   POSTERIOR TIBIAL TENDON REPAIR Left 2019   RIGHT/LEFT HEART CATH AND CORONARY ANGIOGRAPHY N/A 01/29/2021   Procedure: RIGHT/LEFT HEART CATH AND CORONARY ANGIOGRAPHY;  Surgeon: Lyn Records, MD;  Location: [facility];  Service: Cardiovascular;  Laterality: N/A;   ROTATOR CUFF REPAIR Right    SHOULDER ARTHROSCOPY WITH DISTAL CLAVICLE RESECTION Left 12/09/2022   Procedure: SHOULDER ARTHROSCOPY WITH DISTAL CLAVICLE RESECTION AND SUBACROMIAL DECOMPRESSION,LABRAL DEBRIDEMENT;  Surgeon: Francena Hanly, MD;  Location: [facility] ORS;  Service: Orthopedics;  Laterality: Left;  exparel block 120   SHOULDER ARTHROSCOPY WITH ROTATOR CUFF REPAIR Left 12/09/2022   Procedure: SHOULDER ARTHROSCOPY WITH DERIDEMENT AND  ROTATOR CUFF REPAIR;  Surgeon: Francena Hanly, MD;  Location: [facility];  Service: Orthopedics;  Laterality: Left;  exparel block 120   TRANSCATHETER AORTIC VALVE REPLACEMENT, TRANSFEMORAL N/A 02/12/2021   Procedure: TRANSCATHETER AORTIC VALVE REPLACEMENT, TRANSFEMORAL;  Surgeon: Tonny Bollman, MD;  Location: [facility] OR;  Service: Open Heart Surgery;  Laterality: N/A;   TUBAL LIGATION     ULTRASOUND GUIDANCE FOR VASCULAR ACCESS Bilateral 02/12/2021   Procedure: ULTRASOUND GUIDANCE FOR VASCULAR ACCESS;  Surgeon: Tonny Bollman, MD;  Location: [facility] OR;  Service: Open Heart Surgery;  Laterality: Bilateral;   WISDOM TOOTH EXTRACTION  1973  Patient Active Problem List   Diagnosis Date Noted   Ovarian mass, left 04/02/2022   Transient complete heart block (HCC) 02/13/2021   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   Raynaud disease    GERD (gastroesophageal reflux disease)    Fibromyalgia    Depression    Severe aortic stenosis    Hypertension    Connective tissue disease (HCC)    Hyperlipidemia    Asthma     PCP: bROOK hESTER np  REFERRING PROVIDER: Luciano Cutter, MD   REFERRING DIAG: R29.898 (ICD-10-CM) - Muscular deconditioning    Rationale for Evaluation and Treatment: Rehabilitation  THERAPY DIAG:  Muscle weakness (generalized)  Abnormal posture  Unsteadiness on feet  Other low back pain  ONSET DATE: 3 years  SUBJECTIVE:                                                                                                                                                                                           SUBJECTIVE STATEMENT: Pt is looking forward to getting in the water.  She has done water aerobics in the past.  Hasn't exercised since moving here 3 years ago. She bought a rollator and "It is a life saver!".  Pt used rollator for ambulating to/from pool today.   POOL ACCESS: not currently a member anywhere.  May have coverage with [employer]  From initial evaluation: I have not been moving for the last 3 years due to multiple issues.  My back pain creeps up every once in a while, had an ablation 2 years ago, also fusion Lumbar spine in 2013. I am in the process of getting another ablation.  I have trouble standing and walking due to the back pain.  Bilateral shoulder surgeries in past that still bother me. Have lost 40 lb which has helped. Have problems with breathing.  PERTINENT HISTORY:  AS s/p TAVR, hypertension, chronic diastolic heart failure, rheumatoid arthritis on biologics, and mixed connective tissue disease, chronic shortness of breath  -Diffuse/interstitial lung disease, shortness of breath, mixed tissue connective tissue, Autoimmune disease, Fibrosis lung   PAIN:  Are you having pain? Yes: NPRS scale: current 3/10; worst 7/10 least 3/10  Pain location: LB and left buttock, shoulders, hands Pain description: ache Aggravating factors: moving, walking standing Relieving factors: non movement  PRECAUTIONS: Fall Chronic SOB  RED FLAGS: None   WEIGHT BEARING RESTRICTIONS: No  FALLS:  Has patient fallen in last 6 months? No  LIVING ENVIRONMENT: Lives with: husband Lives in:  House/apartment Stairs: Yes: External: 2 steps; on right going up Has following equipment at home: Single point cane  OCCUPATION: retired  PLOF: Independent  PATIENT GOALS: walk for exercise (cardiologist); going on vacation where I have to walk some  NEXT MD VISIT: as needed  OBJECTIVE:  Note: Objective measures were completed at Evaluation unless otherwise noted.  DIAGNOSTIC FINDINGS:  N/a  PATIENT SURVEYS:  FOTO completed but did not save (??)  COGNITION: Overall cognitive status: Within functional limits for tasks assessed      MUSCLE LENGTH:  wfl  POSTURE: increased thoracic kyphosis and weight shift right slight bilat knee valgus   LUMBAR ROM:   AROM eval  Flexion full  Extension full  Right lateral flexion 25% limited  Left lateral flexion 25% limited  Right rotation   Left rotation    (Blank rows = not tested)  LOWER EXTREMITY ROM:     wfl  LOWER EXTREMITY MMT:    MMT Right eval Left eval  Hip flexion 42.7 44.9  Hip extension    Hip abduction 31.2 36.0  Hip adduction    Hip internal rotation    Hip external rotation    Knee flexion    Knee extension 33.1 35.1  Ankle dorsiflexion    Ankle plantarflexion    Ankle inversion    Ankle eversion     (Blank rows = not tested)   FUNCTIONAL TESTS:  5 times sit to stand: 15.47 Timed up and go (TUG): 13.17 4 stage: NBOS x 10 s; unable to complete semi-tandem, tandem or SLS Berg: 32/56  GAIT: Distance walked: 400 ft Assistive device utilized: None Level of assistance: Complete Independence Comments:  a ntalgic, increased stance during swing bilat  TREATMENT  Pt seen for aquatic therapy today.  Treatment took place in water 3.5-4.75 ft in depth at the [facility] pool. Temp of water was 91.  Pt entered/exited the pool via stairs independently with bilat rail. - walking unsupported forward/ backward 4 laps - side stepping with arm addct x 2 laps with cues for decreased step length -  marching forward/ backward with reciprocal row with rainbow hand floats  x 2 lap, cues to decrease height of knee - farmer carry with bilat rainbow hand floats at sides, walking backward/forward x 1 (UE fatigue) - UE on wall: relaxed squat x 10 - TrA set with 1/2 noodle x 5 (cramping in LLE) - seated in lift chair: LAQ, hip abdc/addct, cycling (pain to 0/10)  Pt requires the buoyancy and hydrostatic pressure of water for support, and to offload joints by unweighting joint load by at least 50 % in navel deep water and by at least 75-80% in chest to neck deep water.  Viscosity of the water is needed for resistance of strengthening. Water current perturbations provides challenge to standing balance requiring increased core activation.     PATIENT EDUCATION:  Education details:aquatic program progression; pools in area.   Person educated: Patient Education method: Explanation handout Education comprehension: verbalized understanding  HOME EXERCISE PROGRAM: tba  ASSESSMENT:  CLINICAL IMPRESSION: Pt demonstrates safety and independence in aquatic setting with therapsit instructing from deck. Pt demonstrates confidence in setting, moving throughout all depths easily.  Pt is directed through various movement patterns and trials  in both sitting and standing positions. Pt reported initial increase in L hip irritation with side stepping and marching; reduced with decrease in step height and length. Pt reported elimination of pain when seated on lift chair, exercising LEs.   Goals are ongoing.     From Initial Evaluation: Patient is a 73 y.o. f who was  seen today for physical therapy evaluation and treatment for generalized weakness. She has an extensive Pmhx which includes RA, LB surgeries, lung dysfunction and CHF.  She recently has had an exacerbation of LBP which she will be receiving an ablation.  She has had bilateral shoulder surgeries.  Her muscle weakness therefore is multifactorial.  She  reports lack of exercise/mobility for the past 3 years and has a goal to increase her toleration to activity and improve strength to be able to be more active and travel. She does state that her breathing is her most limiting issue with toleration to activity.  She is a good candidate for skilled PT to improve all areas of deficit.  She will begin with aquatic only intervention to demonstrate that due to her dysfunctions she will tolerate the water medium well to meet goals.  OBJECTIVE IMPAIRMENTS: Abnormal gait, decreased activity tolerance, decreased balance, decreased endurance, decreased mobility, difficulty walking, decreased strength, impaired flexibility, postural dysfunction, obesity, and pain.   ACTIVITY LIMITATIONS: standing, stairs, and locomotion level  PARTICIPATION LIMITATIONS: cleaning, laundry, shopping, community activity, and yard work Programme researcher, broadcasting/film/video  PERSONAL FACTORS: Past/current experiences, Time since onset of injury/illness/exacerbation, and 3+ comorbidities: see PmHx  are also affecting patient's functional outcome.   REHAB POTENTIAL: Good  CLINICAL DECISION MAKING: Evolving/moderate complexity  EVALUATION COMPLEXITY: Moderate   GOALS: Goals reviewed with patient? Yes  SHORT TERM GOALS: Target date: 12/11/23  Pt will tolerate full aquatic sessions consistently without increase in pain and with improving function to demonstrate good toleration and effectiveness of intervention.  Baseline: Goal status: INITIAL  2.  Pt will tolerate walking to and from setting (553ft ea way), engaging in aquatic therapy session without limiting pain or fatigue Baseline: TBD Goal status: INITIAL    LONG TERM GOALS: Target date: 12/25/23  Pt to improve on LEFS by at least 9 points to demonstrate improved functional ability Baseline:  Goal status: INITIAL  2.  Pt will improve on Berg balance test to >/= 50/56 to demonstrate a decrease in fall risk. Baseline: 32/56 Goal status:  INITIAL  3.  Pt will be indep with final aquatic and land (as approp)HEP for continued management of condition with verbal plan on consistency (gym and/or pool access vs indep at home). Baseline: none Goal status: INITIAL  4.  Pt will improve on 5 X STS test to <or= 12s  to demonstrate improving functional lower extremity strength, transitional movements, and balance Baseline: 15.47 Goal status: INITIAL  5.  Pt to acknowledge ability to tolerate/plan a vacation  Baseline: too fatigable to tolerate Goal status: INITIAL  6.  Pt will improve strength in all areas listed by at least 5 lbs to demonstrate improved overall physical function Baseline: see chart Goal status: INITIAL  PLAN:  PT FREQUENCY: 2x/week  PT DURATION: 8 weeks  PLANNED INTERVENTIONS: [id number]- PT Re-evaluation, 97110-Therapeutic exercises, 97530- Therapeutic activity, 97112- Neuromuscular re-education, 97535- Self Care, [id number]- Manual therapy, [id number]- Gait training, [phone]- Orthotic Fit/training, [phone]- Aquatic Therapy, [phone]- Ionotophoresis 4mg /ml Dexamethasone, Patient/Family education, Balance training, Stair training, Taping, Dry Needling, DME instructions, Cryotherapy, and Moist heat.  PLAN FOR NEXT SESSION: Aquatic only: general strengthening.Gentle ROM/stretching; balance retraining, core stability

## 2023-11-24 ENCOUNTER — Encounter (HOSPITAL_BASED_OUTPATIENT_CLINIC_OR_DEPARTMENT_OTHER): Payer: Self-pay | Admitting: Physical Therapy

## 2023-11-24 ENCOUNTER — Ambulatory Visit (HOSPITAL_BASED_OUTPATIENT_CLINIC_OR_DEPARTMENT_OTHER): Payer: Medicare Other | Attending: Pulmonary Disease | Admitting: Physical Therapy

## 2023-11-24 DIAGNOSIS — R293 Abnormal posture: Secondary | ICD-10-CM | POA: Diagnosis present

## 2023-11-24 DIAGNOSIS — M5459 Other low back pain: Secondary | ICD-10-CM | POA: Diagnosis present

## 2023-11-24 DIAGNOSIS — R2681 Unsteadiness on feet: Secondary | ICD-10-CM | POA: Diagnosis present

## 2023-11-24 DIAGNOSIS — M6281 Muscle weakness (generalized): Secondary | ICD-10-CM | POA: Diagnosis present

## 2023-11-24 NOTE — Therapy (Signed)
 OUTPATIENT PHYSICAL THERAPY THORACOLUMBAR TREATMENT   Patient Name: Katherine Waters MRN: [id number] DOB:1951-02-25, 73 y.o., female Today's Date: 11/24/2023  END OF SESSION:  PT End of Session - 11/24/23 1010     Visit Number 3    Date for PT Re-Evaluation 12/25/23    Authorization Type mcr    Progress Note Due on Visit 10    PT Start Time 1010    PT Stop Time 1049    PT Time Calculation (min) 39 min    Activity Tolerance Patient tolerated treatment well    Behavior During Therapy WFL for tasks assessed/performed             Past Medical History:  Diagnosis Date   Arthritis    osteoarthritis   Asthma    Seasonal   Connective tissue disease (HCC)    CREST syndrome (HCC)    Depression    Diabetes 1.5, managed as type 2 (HCC)    Dyspnea    Fibromyalgia    GERD (gastroesophageal reflux disease)    History of migraine headaches    Horseshoe kidney    Hyperlipidemia    Hypertension    Inflammatory arthritis    PONV (postoperative nausea and vomiting)    Raynaud disease    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 Wonda and Dr. Dusty    Severe aortic stenosis    Sleep apnea    Mild, CPAP wore for one year   Past Surgical History:  Procedure Laterality Date   ABDOMINAL HYSTERECTOMY     BREAST BIOPSY Right    CARDIAC CATHETERIZATION     CATARACT EXTRACTION, BILATERAL     COLONOSCOPY WITH ESOPHAGOGASTRODUODENOSCOPY (EGD) AND ESOPHAGEAL DILATION (ED)     2016   INTRAOPERATIVE TRANSTHORACIC ECHOCARDIOGRAM Left 02/12/2021   Procedure: INTRAOPERATIVE TRANSTHORACIC ECHOCARDIOGRAM;  Surgeon: Wonda Sharper, MD;  Location: [facility] OR;  Service: Open Heart Surgery;  Laterality: Left;   LAPAROSCOPIC BILATERAL SALPINGO OOPHERECTOMY Bilateral 04/02/2022   Procedure: LAPAROSCOPIC BILATERAL SALPINGO OOPHORECTOMY;  Surgeon: Rosalva Sawyer, MD;  Location: [facility] OR;  Service: Gynecology;  Laterality:  Bilateral;   LUMBAR FUSION  2013   POSTERIOR TIBIAL TENDON REPAIR Left 2019   RIGHT/LEFT HEART CATH AND CORONARY ANGIOGRAPHY N/A 01/29/2021   Procedure: RIGHT/LEFT HEART CATH AND CORONARY ANGIOGRAPHY;  Surgeon: Claudene Victory ORN, MD;  Location: [facility];  Service: Cardiovascular;  Laterality: N/A;   ROTATOR CUFF REPAIR Right    SHOULDER ARTHROSCOPY WITH DISTAL CLAVICLE RESECTION Left 12/09/2022   Procedure: SHOULDER ARTHROSCOPY WITH DISTAL CLAVICLE RESECTION AND SUBACROMIAL DECOMPRESSION,LABRAL DEBRIDEMENT;  Surgeon: Melita Drivers, MD;  Location: [facility] ORS;  Service: Orthopedics;  Laterality: Left;  exparel  block 120   SHOULDER ARTHROSCOPY WITH ROTATOR CUFF REPAIR Left 12/09/2022   Procedure: SHOULDER ARTHROSCOPY WITH DERIDEMENT AND  ROTATOR CUFF REPAIR;  Surgeon: Melita Drivers, MD;  Location: [facility] ORS;  Service: Orthopedics;  Laterality: Left;  exparel  block 120   TRANSCATHETER AORTIC VALVE REPLACEMENT, TRANSFEMORAL N/A 02/12/2021   Procedure: TRANSCATHETER AORTIC VALVE REPLACEMENT, TRANSFEMORAL;  Surgeon: Wonda Sharper, MD;  Location: [facility] OR;  Service: Open Heart Surgery;  Laterality: N/A;   TUBAL LIGATION     ULTRASOUND GUIDANCE FOR VASCULAR ACCESS Bilateral 02/12/2021   Procedure: ULTRASOUND GUIDANCE FOR VASCULAR ACCESS;  Surgeon: Wonda Sharper, MD;  Location[facility] OR;  Service: Open Heart Surgery;  Laterality: Bilateral;   WISDOM TOOTH EXTRACTION  1973  Patient Active Problem List   Diagnosis Date Noted   Ovarian mass, left 04/02/2022   Transient complete heart block (HCC) 02/13/2021   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   Raynaud disease    GERD (gastroesophageal reflux disease)    Fibromyalgia    Depression    Severe aortic stenosis    Hypertension    Connective tissue disease (HCC)    Hyperlipidemia    Asthma     PCP: bROOK hESTER np  REFERRING PROVIDER: Kassie Acquanetta Bradley, MD   REFERRING DIAG: R29.898 (ICD-10-CM) - Muscular deconditioning    Rationale for Evaluation and Treatment: Rehabilitation  THERAPY DIAG:  Muscle weakness (generalized)  Abnormal posture  Unsteadiness on feet  Other low back pain  ONSET DATE: 3 years  SUBJECTIVE:                                                                                                                                                                                           SUBJECTIVE STATEMENT: Ablation next Wed. I felt so good right after last treatment then got real sore a few hours later.     POOL ACCESS: not currently a member anywhere.  May have coverage with [employer]  From initial evaluation: I have not been moving for the last 3 years due to multiple issues.  My back pain creeps up every once in a while, had an ablation 2 years ago, also fusion Lumbar spine in 2013. I am in the process of getting another ablation.  I have trouble standing and walking due to the back pain.  Bilateral shoulder surgeries in past that still bother me. Have lost 40 lb which has helped. Have problems with breathing.  PERTINENT HISTORY:  AS s/p TAVR, hypertension, chronic diastolic heart failure, rheumatoid arthritis on biologics, and mixed connective tissue disease, chronic shortness of breath  -Diffuse/interstitial lung disease, shortness of breath, mixed tissue connective tissue, Autoimmune disease, Fibrosis lung   PAIN:  Are you having pain? Yes: NPRS scale: current 4/10; worst 7/10 least 3/10  Pain location: LB and left buttock, shoulders, hands Pain description: ache Aggravating factors: moving, walking standing Relieving factors: non movement  PRECAUTIONS: Fall Chronic SOB  RED FLAGS: None   WEIGHT BEARING RESTRICTIONS: No  FALLS:  Has patient fallen in last 6 months? No  LIVING ENVIRONMENT: Lives with: husband Lives in: House/apartment Stairs: Yes: External: 2 steps; on right going up Has following equipment at home: Single point cane  OCCUPATION:  retired  PLOF: Independent  PATIENT GOALS: walk for exercise (cardiologist); going on vacation where I have to walk some  NEXT MD  VISIT: as needed  OBJECTIVE:  Note: Objective measures were completed at Evaluation unless otherwise noted.  DIAGNOSTIC FINDINGS:  N/a  PATIENT SURVEYS:  FOTO completed but did not save (??)  COGNITION: Overall cognitive status: Within functional limits for tasks assessed      MUSCLE LENGTH:  wfl  POSTURE: increased thoracic kyphosis and weight shift right slight bilat knee valgus   LUMBAR ROM:   AROM eval  Flexion full  Extension full  Right lateral flexion 25% limited  Left lateral flexion 25% limited  Right rotation   Left rotation    (Blank rows = not tested)  LOWER EXTREMITY ROM:     wfl  LOWER EXTREMITY MMT:    MMT Right eval Left eval  Hip flexion 42.7 44.9  Hip extension    Hip abduction 31.2 36.0  Hip adduction    Hip internal rotation    Hip external rotation    Knee flexion    Knee extension 33.1 35.1  Ankle dorsiflexion    Ankle plantarflexion    Ankle inversion    Ankle eversion     (Blank rows = not tested)   FUNCTIONAL TESTS:  5 times sit to stand: 15.47 Timed up and go (TUG): 13.17 4 stage: NBOS x 10 s; unable to complete semi-tandem, tandem or SLS Berg: 32/56  GAIT: Distance walked: 400 ft Assistive device utilized: None Level of assistance: Complete Independence Comments:  a ntalgic, increased stance during swing bilat  TREATMENT  Pt seen for aquatic therapy today.  Treatment took place in water 3.5-4.75 ft in depth at the [facility] pool. Temp of water was 91.  Pt entered/exited the pool via stairs independently with bilat rail.  - walking unsupported forward/ backward 4 laps - side stepping with arm addct x 4 laps  - Side stepping ue add/abd with rainbow HB x 2 widths - farmer carry with bilat rainbow hand floats at sides, walking backward/forward x 2 - shoulder horizontal  add/abd - standing ue support yellow noodle: relaxed squat; toe raises; heel raises; slow march *step ups bottom step x 6-7 R/L unsupported (good challenge) - seated in lift chair: LAQ, hip abdc/addct, cycling (pain to 0/10) *walking forward and back between exercises for recovery/squatting as needed as well  Pt requires the buoyancy and hydrostatic pressure of water for support, and to offload joints by unweighting joint load by at least 50 % in navel deep water and by at least 75-80% in chest to neck deep water.  Viscosity of the water is needed for resistance of strengthening. Water current perturbations provides challenge to standing balance requiring increased core activation.     PATIENT EDUCATION:  Education details:aquatic program progression; pools in area.   Person educated: Patient Education method: Explanation handout Education comprehension: verbalized understanding  HOME EXERCISE PROGRAM: tba  ASSESSMENT:  CLINICAL IMPRESSION: Pt cued throughout session to rest as needed. Care taken to avoid overuse of arthritic hands with exercises. Pt with good standing balance exercising with wrists supported on yellow noodle. L stretch allowed for good LB engagement. Pt does report some fatigue upon completion with slight increase in intensity today.  Goals ongoing     From Initial Evaluation: Patient is a 73 y.o. f who was seen today for physical therapy evaluation and treatment for generalized weakness. She has an extensive Pmhx which includes RA, LB surgeries, lung dysfunction and CHF.  She recently has had an exacerbation of LBP which she will be receiving an ablation.  She has  had bilateral shoulder surgeries.  Her muscle weakness therefore is multifactorial.  She reports lack of exercise/mobility for the past 3 years and has a goal to increase her toleration to activity and improve strength to be able to be more active and travel. She does state that her breathing is her most  limiting issue with toleration to activity.  She is a good candidate for skilled PT to improve all areas of deficit.  She will begin with aquatic only intervention to demonstrate that due to her dysfunctions she will tolerate the water medium well to meet goals.  OBJECTIVE IMPAIRMENTS: Abnormal gait, decreased activity tolerance, decreased balance, decreased endurance, decreased mobility, difficulty walking, decreased strength, impaired flexibility, postural dysfunction, obesity, and pain.   ACTIVITY LIMITATIONS: standing, stairs, and locomotion level  PARTICIPATION LIMITATIONS: cleaning, laundry, shopping, community activity, and yard work programme researcher, broadcasting/film/video  PERSONAL FACTORS: Past/current experiences, Time since onset of injury/illness/exacerbation, and 3+ comorbidities: see PmHx  are also affecting patient's functional outcome.   REHAB POTENTIAL: Good  CLINICAL DECISION MAKING: Evolving/moderate complexity  EVALUATION COMPLEXITY: Moderate   GOALS: Goals reviewed with patient? Yes  SHORT TERM GOALS: Target date: 12/11/23  Pt will tolerate full aquatic sessions consistently without increase in pain and with improving function to demonstrate good toleration and effectiveness of intervention.  Baseline: Goal status: INITIAL  2.  Pt will tolerate walking to and from setting (534ft ea way), engaging in aquatic therapy session without limiting pain or fatigue Baseline: TBD Goal status: INITIAL    LONG TERM GOALS: Target date: 12/25/23  Pt to improve on LEFS by at least 9 points to demonstrate improved functional ability Baseline:  Goal status: INITIAL  2.  Pt will improve on Berg balance test to >/= 50/56 to demonstrate a decrease in fall risk. Baseline: 32/56 Goal status: INITIAL  3.  Pt will be indep with final aquatic and land (as approp)HEP for continued management of condition with verbal plan on consistency (gym and/or pool access vs indep at home). Baseline: none Goal status:  INITIAL  4.  Pt will improve on 5 X STS test to <or= 12s  to demonstrate improving functional lower extremity strength, transitional movements, and balance Baseline: 15.47 Goal status: INITIAL  5.  Pt to acknowledge ability to tolerate/plan a vacation  Baseline: too fatigable to tolerate Goal status: INITIAL  6.  Pt will improve strength in all areas listed by at least 5 lbs to demonstrate improved overall physical function Baseline: see chart Goal status: INITIAL  PLAN:  PT FREQUENCY: 2x/week  PT DURATION: 8 weeks  PLANNED INTERVENTIONS: 97164- PT Re-evaluation, 97110-Therapeutic exercises, 97530- Therapeutic activity, 97112- Neuromuscular re-education, 97535- Self Care, [id number]- Manual therapy, [id number]- Gait training, [phone]- Orthotic Fit/training, [phone]- Aquatic Therapy, [phone]- Ionotophoresis 4mg /ml Dexamethasone , Patient/Family education, Balance training, Stair training, Taping, Dry Needling, DME instructions, Cryotherapy, and Moist heat.  PLAN FOR NEXT SESSION: Aquatic only: general strengthening.Gentle ROM/stretching; balance retraining, core stability

## 2023-11-26 ENCOUNTER — Ambulatory Visit (HOSPITAL_BASED_OUTPATIENT_CLINIC_OR_DEPARTMENT_OTHER): Payer: Medicare Other | Admitting: Physical Therapy

## 2023-11-26 ENCOUNTER — Encounter (HOSPITAL_BASED_OUTPATIENT_CLINIC_OR_DEPARTMENT_OTHER): Payer: Self-pay | Admitting: Physical Therapy

## 2023-11-26 DIAGNOSIS — R293 Abnormal posture: Secondary | ICD-10-CM

## 2023-11-26 DIAGNOSIS — R2681 Unsteadiness on feet: Secondary | ICD-10-CM

## 2023-11-26 DIAGNOSIS — M6281 Muscle weakness (generalized): Secondary | ICD-10-CM | POA: Diagnosis not present

## 2023-11-26 DIAGNOSIS — M5459 Other low back pain: Secondary | ICD-10-CM

## 2023-11-26 NOTE — Therapy (Signed)
 OUTPATIENT PHYSICAL THERAPY THORACOLUMBAR TREATMENT   Patient Name: Katherine Waters MRN: [id number] DOB:25-Nov-1950, 73 y.o., female Today's Date: 11/26/2023  END OF SESSION:  PT End of Session - 11/26/23 1018     Visit Number 4    Date for PT Re-Evaluation 12/25/23    Authorization Type mcr    Progress Note Due on Visit 10    PT Start Time 1017    PT Stop Time 1055    PT Time Calculation (min) 38 min    Activity Tolerance Patient tolerated treatment well    Behavior During Therapy WFL for tasks assessed/performed             Past Medical History:  Diagnosis Date   Arthritis    osteoarthritis   Asthma    Seasonal   Connective tissue disease (HCC)    CREST syndrome (HCC)    Depression    Diabetes 1.5, managed as type 2 (HCC)    Dyspnea    Fibromyalgia    GERD (gastroesophageal reflux disease)    History of migraine headaches    Horseshoe kidney    Hyperlipidemia    Hypertension    Inflammatory arthritis    PONV (postoperative nausea and vomiting)    Raynaud disease    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 Wonda and Dr. Dusty    Severe aortic stenosis    Sleep apnea    Mild, CPAP wore for one year   Past Surgical History:  Procedure Laterality Date   ABDOMINAL HYSTERECTOMY     BREAST BIOPSY Right    CARDIAC CATHETERIZATION     CATARACT EXTRACTION, BILATERAL     COLONOSCOPY WITH ESOPHAGOGASTRODUODENOSCOPY (EGD) AND ESOPHAGEAL DILATION (ED)     2016   INTRAOPERATIVE TRANSTHORACIC ECHOCARDIOGRAM Left 02/12/2021   Procedure: INTRAOPERATIVE TRANSTHORACIC ECHOCARDIOGRAM;  Surgeon: Wonda Sharper, MD;  Location: [facility] OR;  Service: Open Heart Surgery;  Laterality: Left;   LAPAROSCOPIC BILATERAL SALPINGO OOPHERECTOMY Bilateral 04/02/2022   Procedure: LAPAROSCOPIC BILATERAL SALPINGO OOPHORECTOMY;  Surgeon: Rosalva Sawyer, MD;  Location: [facility] OR;  Service: Gynecology;  Laterality:  Bilateral;   LUMBAR FUSION  2013   POSTERIOR TIBIAL TENDON REPAIR Left 2019   RIGHT/LEFT HEART CATH AND CORONARY ANGIOGRAPHY N/A 01/29/2021   Procedure: RIGHT/LEFT HEART CATH AND CORONARY ANGIOGRAPHY;  Surgeon: Claudene Victory ORN, MD;  Location: [facility];  Service: Cardiovascular;  Laterality: N/A;   ROTATOR CUFF REPAIR Right    SHOULDER ARTHROSCOPY WITH DISTAL CLAVICLE RESECTION Left 12/09/2022   Procedure: SHOULDER ARTHROSCOPY WITH DISTAL CLAVICLE RESECTION AND SUBACROMIAL DECOMPRESSION,LABRAL DEBRIDEMENT;  Surgeon: Melita Drivers, MD;  Location: [facility] ORS;  Service: Orthopedics;  Laterality: Left;  exparel  block 120   SHOULDER ARTHROSCOPY WITH ROTATOR CUFF REPAIR Left 12/09/2022   Procedure: SHOULDER ARTHROSCOPY WITH DERIDEMENT AND  ROTATOR CUFF REPAIR;  Surgeon: Melita Drivers, MD;  Location: [facility] OR[facility];  Service: Orthopedics;  Laterality: Left;  exparel  block 120   TRANSCATHETER AORTIC VALVE REPLACEMENT, TRANSFEMORAL N/A 02/12/2021   Procedure: TRANSCATHETER AORTIC VALVE REPLACEMENT, TRANSFEMORAL;  Surgeon: Wonda Sharper, MD;  Location: [facility] OR;  Service: Open Heart Surgery;  Laterality: N/A;   TUBAL LIGATION     ULTRASOUND GUIDANCE FOR VASCULAR ACCESS Bilateral 02/12/2021   Procedure: ULTRASOUND GUIDANCE FOR VASCULAR ACCESS;  Surgeon: Wonda Sharper, MD;  Location: [facility] OR;  Service: Open Heart Surgery;  Laterality: Bilateral;   WISDOM TOOTH EXTRACTION  1973  Patient Active Problem List   Diagnosis Date Noted   Ovarian mass, left 04/02/2022   Transient complete heart block (HCC) 02/13/2021   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   Raynaud disease    GERD (gastroesophageal reflux disease)    Fibromyalgia    Depression    Severe aortic stenosis    Hypertension    Connective tissue disease (HCC)    Hyperlipidemia    Asthma     PCP: bROOK hESTER np  REFERRING PROVIDER: Kassie Acquanetta Bradley, MD   REFERRING DIAG: R29.898 (ICD-10-CM) - Muscular deconditioning    Rationale for Evaluation and Treatment: Rehabilitation  THERAPY DIAG:  Muscle weakness (generalized)  Abnormal posture  Unsteadiness on feet  Other low back pain  ONSET DATE: 3 years  SUBJECTIVE:                                                                                                                                                                                           SUBJECTIVE STATEMENT: Hips aren't as bad but my shoulder is bad, I think it is because the weather.  POOL ACCESS: not currently a member anywhere.  May have coverage with [employer]  From initial evaluation: I have not been moving for the last 3 years due to multiple issues.  My back pain creeps up every once in a while, had an ablation 2 years ago, also fusion Lumbar spine in 2013. I am in the process of getting another ablation.  I have trouble standing and walking due to the back pain.  Bilateral shoulder surgeries in past that still bother me. Have lost 40 lb which has helped. Have problems with breathing.  PERTINENT HISTORY:  AS s/p TAVR, hypertension, chronic diastolic heart failure, rheumatoid arthritis on biologics, and mixed connective tissue disease, chronic shortness of breath  -Diffuse/interstitial lung disease, shortness of breath, mixed tissue connective tissue, Autoimmune disease, Fibrosis lung   PAIN:  Are you having pain? Yes: NPRS scale: current 4/10; worst 7/10 least 3/10  Pain location: LB and left buttock, shoulders, hands Pain description: ache Aggravating factors: moving, walking standing Relieving factors: non movement  PRECAUTIONS: Fall Chronic SOB  RED FLAGS: None   WEIGHT BEARING RESTRICTIONS: No  FALLS:  Has patient fallen in last 6 months? No  LIVING ENVIRONMENT: Lives with: husband Lives in: House/apartment Stairs: Yes: External: 2 steps; on right going up Has following equipment at home: Single point cane  OCCUPATION: retired  PLOF:  Independent  PATIENT GOALS: walk for exercise (cardiologist); going on vacation where I have to walk some  NEXT MD VISIT: as needed  OBJECTIVE:  Note: Objective measures were completed at Evaluation unless otherwise noted.  DIAGNOSTIC FINDINGS:  N/a  PATIENT SURVEYS:  FOTO completed but did not save (??)  COGNITION: Overall cognitive status: Within functional limits for tasks assessed      MUSCLE LENGTH:  wfl  POSTURE: increased thoracic kyphosis and weight shift right slight bilat knee valgus   LUMBAR ROM:   AROM eval  Flexion full  Extension full  Right lateral flexion 25% limited  Left lateral flexion 25% limited  Right rotation   Left rotation    (Blank rows = not tested)  LOWER EXTREMITY ROM:     wfl  LOWER EXTREMITY MMT:    MMT Right eval Left eval  Hip flexion 42.7 44.9  Hip extension    Hip abduction 31.2 36.0  Hip adduction    Hip internal rotation    Hip external rotation    Knee flexion    Knee extension 33.1 35.1  Ankle dorsiflexion    Ankle plantarflexion    Ankle inversion    Ankle eversion     (Blank rows = not tested)   FUNCTIONAL TESTS:  5 times sit to stand: 15.47 Timed up and go (TUG): 13.17 4 stage: NBOS x 10 s; unable to complete semi-tandem, tandem or SLS Berg: 32/56  GAIT: Distance walked: 400 ft Assistive device utilized: None Level of assistance: Complete Independence Comments:  a ntalgic, increased stance during swing bilat  TREATMENT  Pt seen for aquatic therapy today.  Treatment took place in water 3.5-4.75 ft in depth at the [facility] pool. Temp of water was 91.  Pt entered/exited the pool via stairs independently with bilat rail.  - walking unsupported forward/ backward 4 laps - side stepping with arm addct x 4 laps  - Side stepping ue add/abd with rainbow HB x 2 widths -decompression position with noodle wrapped posteriorly across chest: cycling; hip add/abd - shoulder horizontal add/abd in  staggered stance - standing ue support yellow noodle:  toe raises; heel raises; slow march - seated in lift chair: LAQ -in jacuzzi 103d: STS from bench with 4s descent x 10 *walking forward and back between exercises for recovery/squatting as needed as well  Pt requires the buoyancy and hydrostatic pressure of water for support, and to offload joints by unweighting joint load by at least 50 % in navel deep water and by at least 75-80% in chest to neck deep water.  Viscosity of the water is needed for resistance of strengthening. Water current perturbations provides challenge to standing balance requiring increased core activation.     PATIENT EDUCATION:  Education details:aquatic program progression; pools in area.   Person educated: Patient Education method: Explanation handout Education comprehension: verbalized understanding  HOME EXERCISE PROGRAM: tba  ASSESSMENT:  CLINICAL IMPRESSION: Pt reports decrease in post session soreness after last session although did experience some.  She feels that the program so far is effective and hitting the areas that need it.  Some residual left hip soarness after session but reduced by 2 NPRS. She is able to gain decompression position demonstrating good balance in sitting submerged. Cues throughout session for core engagement.  Goals ongoing      From Initial Evaluation: Patient is a 73 y.o. f who was seen today for physical therapy evaluation and treatment for generalized weakness. She has an extensive Pmhx which includes RA, LB surgeries, lung dysfunction and CHF.  She recently has had an exacerbation of LBP which she will be receiving an ablation.  She has had bilateral shoulder surgeries.  Her muscle weakness therefore is multifactorial.  She reports lack of exercise/mobility for the past 3 years and has a goal to increase her toleration to activity and improve strength to be able to be more active and travel. She does state that her  breathing is her most limiting issue with toleration to activity.  She is a good candidate for skilled PT to improve all areas of deficit.  She will begin with aquatic only intervention to demonstrate that due to her dysfunctions she will tolerate the water medium well to meet goals.  OBJECTIVE IMPAIRMENTS: Abnormal gait, decreased activity tolerance, decreased balance, decreased endurance, decreased mobility, difficulty walking, decreased strength, impaired flexibility, postural dysfunction, obesity, and pain.   ACTIVITY LIMITATIONS: standing, stairs, and locomotion level  PARTICIPATION LIMITATIONS: cleaning, laundry, shopping, community activity, and yard work programme researcher, broadcasting/film/video  PERSONAL FACTORS: Past/current experiences, Time since onset of injury/illness/exacerbation, and 3+ comorbidities: see PmHx  are also affecting patient's functional outcome.   REHAB POTENTIAL: Good  CLINICAL DECISION MAKING: Evolving/moderate complexity  EVALUATION COMPLEXITY: Moderate   GOALS: Goals reviewed with patient? Yes  SHORT TERM GOALS: Target date: 12/11/23  Pt will tolerate full aquatic sessions consistently without increase in pain and with improving function to demonstrate good toleration and effectiveness of intervention.  Baseline: Goal status: INITIAL  2.  Pt will tolerate walking to and from setting (528ft ea way), engaging in aquatic therapy session without limiting pain or fatigue Baseline: TBD Goal status: INITIAL    LONG TERM GOALS: Target date: 12/25/23  Pt to improve on LEFS by at least 9 points to demonstrate improved functional ability Baseline:  Goal status: INITIAL  2.  Pt will improve on Berg balance test to >/= 50/56 to demonstrate a decrease in fall risk. Baseline: 32/56 Goal status: INITIAL  3.  Pt will be indep with final aquatic and land (as approp)HEP for continued management of condition with verbal plan on consistency (gym and/or pool access vs indep at home). Baseline:  none Goal status: INITIAL  4.  Pt will improve on 5 X STS test to <or= 12s  to demonstrate improving functional lower extremity strength, transitional movements, and balance Baseline: 15.47 Goal status: INITIAL  5.  Pt to acknowledge ability to tolerate/plan a vacation  Baseline: too fatigable to tolerate Goal status: INITIAL  6.  Pt will improve strength in all areas listed by at least 5 lbs to demonstrate improved overall physical function Baseline: see chart Goal status: INITIAL  PLAN:  PT FREQUENCY: 2x/week  PT DURATION: 8 weeks  PLANNED INTERVENTIONS: [id number]- PT Re-evaluation, 97110-Therapeutic exercises, 97530- Therapeutic activity, 97112- Neuromuscular re-education, 97535- Self Care, [id number]- Manual therapy, [id number]- Gait training, [phone]- Orthotic Fit/training, [phone]- Aquatic Therapy, [phone]- Ionotophoresis 4mg /ml Dexamethasone , Patient/Family education, Balance training, Stair training, Taping, Dry Needling, DME instructions, Cryotherapy, and Moist heat.  PLAN FOR NEXT SESSION: Aquatic only: general strengthening.Gentle ROM/stretching; balance retraining, core stability

## 2023-11-30 ENCOUNTER — Encounter (HOSPITAL_BASED_OUTPATIENT_CLINIC_OR_DEPARTMENT_OTHER): Payer: Self-pay | Admitting: Physical Therapy

## 2023-11-30 ENCOUNTER — Ambulatory Visit (HOSPITAL_BASED_OUTPATIENT_CLINIC_OR_DEPARTMENT_OTHER): Payer: Medicare Other | Admitting: Physical Therapy

## 2023-11-30 DIAGNOSIS — M6281 Muscle weakness (generalized): Secondary | ICD-10-CM

## 2023-11-30 DIAGNOSIS — R2681 Unsteadiness on feet: Secondary | ICD-10-CM

## 2023-11-30 DIAGNOSIS — R293 Abnormal posture: Secondary | ICD-10-CM

## 2023-11-30 DIAGNOSIS — M5459 Other low back pain: Secondary | ICD-10-CM

## 2023-11-30 NOTE — Therapy (Signed)
 OUTPATIENT PHYSICAL THERAPY THORACOLUMBAR TREATMENT   Patient Name: Katherine Waters MRN: [id number] DOB:January 15, 1951, 73 y.o., female Today's Date: 11/30/2023  END OF SESSION:  PT End of Session - 11/30/23 1107     Visit Number 5    Date for PT Re-Evaluation 12/25/23    Authorization Type mcr    Progress Note Due on Visit 10    PT Start Time 1102    PT Stop Time 1142    PT Time Calculation (min) 40 min    Activity Tolerance Patient tolerated treatment well    Behavior During Therapy WFL for tasks assessed/performed             Past Medical History:  Diagnosis Date   Arthritis    osteoarthritis   Asthma    Seasonal   Connective tissue disease (HCC)    CREST syndrome (HCC)    Depression    Diabetes 1.5, managed as type 2 (HCC)    Dyspnea    Fibromyalgia    GERD (gastroesophageal reflux disease)    History of migraine headaches    Horseshoe kidney    Hyperlipidemia    Hypertension    Inflammatory arthritis    PONV (postoperative nausea and vomiting)    Raynaud disease    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 Arlester Ladd and Dr. Alva Jewels    Severe aortic stenosis    Sleep apnea    Mild, CPAP wore for one year   Past Surgical History:  Procedure Laterality Date   ABDOMINAL HYSTERECTOMY     BREAST BIOPSY Right    CARDIAC CATHETERIZATION     CATARACT EXTRACTION, BILATERAL     COLONOSCOPY WITH ESOPHAGOGASTRODUODENOSCOPY (EGD) AND ESOPHAGEAL DILATION (ED)     2016   INTRAOPERATIVE TRANSTHORACIC ECHOCARDIOGRAM Left 02/12/2021   Procedure: INTRAOPERATIVE TRANSTHORACIC ECHOCARDIOGRAM;  Surgeon: Arnoldo Lapping, MD;  Location[facility] OR;  Service: Open Heart Surgery;  Laterality: Left;   LAPAROSCOPIC BILATERAL SALPINGO OOPHERECTOMY Bilateral 04/02/2022   Procedure: LAPAROSCOPIC BILATERAL SALPINGO OOPHORECTOMY;  Surgeon: Arlee Lace, MD;  Location: [facility] OR;  Service: Gynecology;  Laterality:  Bilateral;   LUMBAR FUSION  2013   POSTERIOR TIBIAL TENDON REPAIR Left 2019   RIGHT/LEFT HEART CATH AND CORONARY ANGIOGRAPHY N/A 01/29/2021   Procedure: RIGHT/LEFT HEART CATH AND CORONARY ANGIOGRAPHY;  Surgeon: Arty Binning, MD;  Location: [facility] IN[facility] LAB;  Service: Cardiovascular;  Laterality: N/A;   ROTATOR CUFF REPAIR Right    SHOULDER ARTHROSCOPY WITH DISTAL CLAVICLE RESECTION Left 12/09/2022   Procedure: SHOULDER ARTHROSCOPY WITH DISTAL CLAVICLE RESECTION AND SUBACROMIAL DECOMPRESSION,LABRAL DEBRIDEMENT;  Surgeon: Ellard Gunning, MD;  Location: [facility] ORS;  Service: Orthopedics;  Laterality: Left;  exparel  block 120   SHOULDER ARTHROSCOPY WITH ROTATOR CUFF REPAIR Left 12/09/2022   Procedure: SHOULDER ARTHROSCOPY WITH DERIDEMENT AND  ROTATOR CUFF REPAIR;  Surgeon: Ellard Gunning, MD;  Location: [facility] ORS;  Service: Orthopedics;  Laterality: Left;  exparel  block 120   TRANSCATHETER AORTIC VALVE REPLACEMENT, TRANSFEMORAL N/A 02/12/2021   Procedure: TRANSCATHETER AORTIC VALVE REPLACEMENT, TRANSFEMORAL;  Surgeon: Arnoldo Lapping, MD;  Location: [facility] OR;  Service: Open Heart Surgery;  Laterality: N/A;   TUBAL LIGATION     ULTRASOUND GUIDANCE FOR VASCULAR ACCESS Bilateral 02/12/2021   Procedure: ULTRASOUND GUIDANCE FOR VASCULAR ACCESS;  Surgeon: Arnoldo Lapping, MD;  Location: [facility] OR;  Service: Open Heart Surgery;  Laterality: Bilateral;   WISDOM TOOTH EXTRACTION  1973  Patient Active Problem List   Diagnosis Date Noted   Ovarian mass, left 04/02/2022   Transient complete heart block (HCC) 02/13/2021   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   Raynaud disease    GERD (gastroesophageal reflux disease)    Fibromyalgia    Depression    Severe aortic stenosis    Hypertension    Connective tissue disease (HCC)    Hyperlipidemia    Asthma     PCP: bROOK hESTER np  REFERRING PROVIDER: Quillian Brunt, MD   REFERRING DIAG: R29.898 (ICD-10-CM) - Muscular deconditioning    Rationale for Evaluation and Treatment: Rehabilitation  THERAPY DIAG:  Muscle weakness (generalized)  Abnormal posture  Unsteadiness on feet  Other low back pain  ONSET DATE: 3 years  SUBJECTIVE:                                                                                                                                                                                           SUBJECTIVE STATEMENT: "I woke up on Friday without any back pain.  I didn't know what to do with myself"  POOL ACCESS: not currently a member anywhere.  May have coverage with [employer]  From initial evaluation: I have not been moving for the last 3 years due to multiple issues.  My back pain creeps up every once in a while, had an ablation 2 years ago, also fusion Lumbar spine in 2013. I am in the process of getting another ablation.  I have trouble standing and walking due to the back pain.  Bilateral shoulder surgeries in past that still bother me. Have lost 40 lb which has helped. Have problems with breathing.  PERTINENT HISTORY:  AS s/p TAVR, hypertension, chronic diastolic heart failure, rheumatoid arthritis on biologics, and mixed connective tissue disease, chronic shortness of breath  -Diffuse/interstitial lung disease, shortness of breath, mixed tissue connective tissue, Autoimmune disease, Fibrosis lung   PAIN:  Are you having pain? Yes: NPRS scale: current 3-4/10; worst 7/10 least 3/10  Pain location: LB and left buttock, shoulders, hands Pain description: ache Aggravating factors: moving, walking standing Relieving factors: non movement  PRECAUTIONS: Fall Chronic SOB  RED FLAGS: None   WEIGHT BEARING RESTRICTIONS: No  FALLS:  Has patient fallen in last 6 months? No  LIVING ENVIRONMENT: Lives with: husband Lives in: House/apartment Stairs: Yes: External: 2 steps; on right going up Has following equipment at home: Single point cane  OCCUPATION: retired  PLOF:  Independent  PATIENT GOALS: walk for exercise (cardiologist); going on vacation where I have to walk some  NEXT MD VISIT: as needed  OBJECTIVE:  Note: Objective measures were completed at Evaluation unless otherwise noted.  DIAGNOSTIC FINDINGS:  N/a  PATIENT SURVEYS:  FOTO completed but did not save (??)  COGNITION: Overall cognitive status: Within functional limits for tasks assessed      MUSCLE LENGTH:  wfl  POSTURE: increased thoracic kyphosis and weight shift right slight bilat knee valgus   LUMBAR ROM:   AROM eval  Flexion full  Extension full  Right lateral flexion 25% limited  Left lateral flexion 25% limited  Right rotation   Left rotation    (Blank rows = not tested)  LOWER EXTREMITY ROM:     wfl  LOWER EXTREMITY MMT:    MMT Right eval Left eval  Hip flexion 42.7 44.9  Hip extension    Hip abduction 31.2 36.0  Hip adduction    Hip internal rotation    Hip external rotation    Knee flexion    Knee extension 33.1 35.1  Ankle dorsiflexion    Ankle plantarflexion    Ankle inversion    Ankle eversion     (Blank rows = not tested)   FUNCTIONAL TESTS:  5 times sit to stand: 15.47 Timed up and go (TUG): 13.17 4 stage: NBOS x 10 s; unable to complete semi-tandem, tandem or SLS Berg: 32/56  GAIT: Distance walked: 400 ft Assistive device utilized: None Level of assistance: Complete Independence Comments:  a ntalgic, increased stance during swing bilat  TREATMENT  Pt seen for aquatic therapy today.  Treatment took place in water 3.5-4.75 ft in depth at the [facility] pool. Temp of water was 91.  Pt entered/exited the pool via stairs independently with bilat rail.  - walking unsupported forward/ backward 4 laps - side stepping with arm addct x 4 laps  - Side stepping ue add/abd with rainbow HB x 2 widths -decompression position with noodle wrapped posteriorly across chest: cycling; hip add/abd - standing ue support yellow noodle:   toe raises; heel raises; slow march -figure 4 stretch - shoulder horizontal add/abd in staggered stance   Pt requires the buoyancy and hydrostatic pressure of water for support, and to offload joints by unweighting joint load by at least 50 % in navel deep water and by at least 75-80% in chest to neck deep water.  Viscosity of the water is needed for resistance of strengthening. Water current perturbations provides challenge to standing balance requiring increased core activation.     PATIENT EDUCATION:  Education details:aquatic program progression; pools in area.   Person educated: Patient Education method: Explanation handout Education comprehension: verbalized understanding  HOME EXERCISE PROGRAM: tba  ASSESSMENT:  CLINICAL IMPRESSION: Pt with very good response to last session waking next morning with reports of 0/10 pain. She does report some right hip discomfort throughout session today. Pt kept submerged chest deep + as able to reduce load on hip.  She has ablation scheduled for wed. She consistently is tolerating full sessions of aquatic therapy well walking to and from setting using rollator.  STG#1 met.    From Initial Evaluation: Patient is a 73 y.o. f who was seen today for physical therapy evaluation and treatment for generalized weakness. She has an extensive Pmhx which includes RA, LB surgeries, lung dysfunction and CHF.  She recently has had an exacerbation of LBP which she will be receiving an ablation.  She has had bilateral shoulder surgeries.  Her muscle weakness therefore is multifactorial.  She reports lack of exercise/mobility for the past 3 years and  has a goal to increase her toleration to activity and improve strength to be able to be more active and travel. She does state that her breathing is her most limiting issue with toleration to activity.  She is a good candidate for skilled PT to improve all areas of deficit.  She will begin with aquatic only  intervention to demonstrate that due to her dysfunctions she will tolerate the water medium well to meet goals.  OBJECTIVE IMPAIRMENTS: Abnormal gait, decreased activity tolerance, decreased balance, decreased endurance, decreased mobility, difficulty walking, decreased strength, impaired flexibility, postural dysfunction, obesity, and pain.   ACTIVITY LIMITATIONS: standing, stairs, and locomotion level  PARTICIPATION LIMITATIONS: cleaning, laundry, shopping, community activity, and yard work Programme researcher, broadcasting/film/video  PERSONAL FACTORS: Past/current experiences, Time since onset of injury/illness/exacerbation, and 3+ comorbidities: see PmHx  are also affecting patient's functional outcome.   REHAB POTENTIAL: Good  CLINICAL DECISION MAKING: Evolving/moderate complexity  EVALUATION COMPLEXITY: Moderate   GOALS: Goals reviewed with patient? Yes  SHORT TERM GOALS: Target date: 12/11/23  Pt will tolerate full aquatic sessions consistently without increase in pain and with improving function to demonstrate good toleration and effectiveness of intervention.  Baseline: Goal status: Met 11/30/23  2.  Pt will tolerate walking to and from setting (529ft ea way), engaging in aquatic therapy session without limiting pain or fatigue Baseline: TBD Goal status: In process 11/30/23    LONG TERM GOALS: Target date: 12/25/23  Pt to improve on LEFS by at least 9 points to demonstrate improved functional ability Baseline:  Goal status: INITIAL  2.  Pt will improve on Berg balance test to >/= 50/56 to demonstrate a decrease in fall risk. Baseline: 32/56 Goal status: INITIAL  3.  Pt will be indep with final aquatic and land (as approp)HEP for continued management of condition with verbal plan on consistency (gym and/or pool access vs indep at home). Baseline: none Goal status: INITIAL  4.  Pt will improve on 5 X STS test to <or= 12s  to demonstrate improving functional lower extremity strength, transitional  movements, and balance Baseline: 15.47 Goal status: INITIAL  5.  Pt to acknowledge ability to tolerate/plan a vacation  Baseline: too fatigable to tolerate Goal status: INITIAL  6.  Pt will improve strength in all areas listed by at least 5 lbs to demonstrate improved overall physical function Baseline: see chart Goal status: INITIAL  PLAN:  PT FREQUENCY: 2x/week  PT DURATION: 8 weeks  PLANNED INTERVENTIONS: 97164- PT Re-evaluation, 97110-Therapeutic exercises, 97530- Therapeutic activity, 97112- Neuromuscular re-education, 97535- Self Care, 69629- Manual therapy, [id number]- Gait training, [phone]- Orthotic Fit/training, [phone]- Aquatic Therapy, [phone]- Ionotophoresis 4mg /ml Dexamethasone , Patient/Family education, Balance training, Stair training, Taping, Dry Needling, DME instructions, Cryotherapy, and Moist heat.  PLAN FOR NEXT SESSION: Aquatic only: general strengthening.Gentle ROM/stretching; balance retraining, core stability Lucinda Saber) Laportia Carley MPT 11/30/23 1:49 PM [facility] [address] [place], [place], [address] Phone: [phone]   Fax:  [phone]

## 2023-12-03 ENCOUNTER — Encounter (HOSPITAL_BASED_OUTPATIENT_CLINIC_OR_DEPARTMENT_OTHER): Payer: Self-pay | Admitting: Physical Therapy

## 2023-12-03 ENCOUNTER — Ambulatory Visit (HOSPITAL_BASED_OUTPATIENT_CLINIC_OR_DEPARTMENT_OTHER): Payer: Medicare Other | Admitting: Physical Therapy

## 2023-12-03 DIAGNOSIS — M5459 Other low back pain: Secondary | ICD-10-CM

## 2023-12-03 DIAGNOSIS — R293 Abnormal posture: Secondary | ICD-10-CM

## 2023-12-03 DIAGNOSIS — M6281 Muscle weakness (generalized): Secondary | ICD-10-CM

## 2023-12-03 DIAGNOSIS — R2681 Unsteadiness on feet: Secondary | ICD-10-CM

## 2023-12-03 NOTE — Therapy (Signed)
OUTPATIENT PHYSICAL THERAPY THORACOLUMBAR TREATMENT   Patient Name: Katherine Waters MRN: [id number] DOB:12-19-1950, 73 y.o., female Today's Date: 12/03/2023  END OF SESSION:  PT End of Session - 12/03/23 1101     Visit Number 6    Date for PT Re-Evaluation 12/25/23    Authorization Type MCR    Progress Note Due on Visit 10    PT Start Time 1101    PT Stop Time 1140    PT Time Calculation (min) 39 min    Activity Tolerance Patient tolerated treatment well    Behavior During Therapy WFL for tasks assessed/performed             Past Medical History:  Diagnosis Date   Arthritis    osteoarthritis   Asthma    Seasonal   Connective tissue disease (HCC)    CREST syndrome (HCC)    Depression    Diabetes 1.5, managed as type 2 (HCC)    Dyspnea    Fibromyalgia    GERD (gastroesophageal reflux disease)    History of migraine headaches    Horseshoe kidney    Hyperlipidemia    Hypertension    Inflammatory arthritis    PONV (postoperative nausea and vomiting)    Raynaud disease    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 Excell Seltzer and Dr. Cornelius Moras    Severe aortic stenosis    Sleep apnea    Mild, CPAP wore for one year   Past Surgical History:  Procedure Laterality Date   ABDOMINAL HYSTERECTOMY     BREAST BIOPSY Right    CARDIAC CATHETERIZATION     CATARACT EXTRACTION, BILATERAL     COLONOSCOPY WITH ESOPHAGOGASTRODUODENOSCOPY (EGD) AND ESOPHAGEAL DILATION (ED)     2016   INTRAOPERATIVE TRANSTHORACIC ECHOCARDIOGRAM Left 02/12/2021   Procedure: INTRAOPERATIVE TRANSTHORACIC ECHOCARDIOGRAM;  Surgeon: Tonny Bollman, MD;  Location: [facility] OR;  Service: Open Heart Surgery;  Laterality: Left;   LAPAROSCOPIC BILATERAL SALPINGO OOPHERECTOMY Bilateral 04/02/2022   Procedure: LAPAROSCOPIC BILATERAL SALPINGO OOPHORECTOMY;  Surgeon: Gerald Leitz, MD;  Location: [facility] OR;  Service: Gynecology;  Laterality:  Bilateral;   LUMBAR FUSION  2013   POSTERIOR TIBIAL TENDON REPAIR Left 2019   RIGHT/LEFT HEART CATH AND CORONARY ANGIOGRAPHY N/A 01/29/2021   Procedure: RIGHT/LEFT HEART CATH AND CORONARY ANGIOGRAPHY;  Surgeon: Lyn Records, MD;  Location: [facility];  Service: Cardiovascular;  Laterality: N/A;   ROTATOR CUFF REPAIR Right    SHOULDER ARTHROSCOPY WITH DISTAL CLAVICLE RESECTION Left 12/09/2022   Procedure: SHOULDER ARTHROSCOPY WITH DISTAL CLAVICLE RESECTION AND SUBACROMIAL DECOMPRESSION,LABRAL DEBRIDEMENT;  Surgeon: Francena Hanly, MD;  Location: [facility] ORS;  Service: Orthopedics;  Laterality: Left;  exparel block 120   SHOULDER ARTHROSCOPY WITH ROTATOR CUFF REPAIR Left 12/09/2022   Procedure: SHOULDER ARTHROSCOPY WITH DERIDEMENT AND  ROTATOR CUFF REPAIR;  Surgeon: Francena Hanly, MD;  Location: [facility] ORS;  Service: Orthopedics;  Laterality: Left;  exparel block 120   TRANSCATHETER AORTIC VALVE REPLACEMENT, TRANSFEMORAL N/A 02/12/2021   Procedure: TRANSCATHETER AORTIC VALVE REPLACEMENT, TRANSFEMORAL;  Surgeon: Tonny Bollman, MD;  Location: [facility] OR;  Service: Open Heart Surgery;  Laterality: N/A;   TUBAL LIGATION     ULTRASOUND GUIDANCE FOR VASCULAR ACCESS Bilateral 02/12/2021   Procedure: ULTRASOUND GUIDANCE FOR VASCULAR ACCESS;  Surgeon: Tonny Bollman, MD;  Location: [facility] OR;  Service: Open Heart Surgery;  Laterality: Bilateral;   WISDOM TOOTH EXTRACTION  1973  Patient Active Problem List   Diagnosis Date Noted   Ovarian mass, left 04/02/2022   Transient complete heart block (HCC) 02/13/2021   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   Raynaud disease    GERD (gastroesophageal reflux disease)    Fibromyalgia    Depression    Severe aortic stenosis    Hypertension    Connective tissue disease (HCC)    Hyperlipidemia    Asthma     PCP: bROOK hESTER np  REFERRING PROVIDER: Luciano Cutter, MD   REFERRING DIAG: R29.898 (ICD-10-CM) - Muscular deconditioning    Rationale for Evaluation and Treatment: Rehabilitation  THERAPY DIAG:  Muscle weakness (generalized)  Abnormal posture  Unsteadiness on feet  Other low back pain  ONSET DATE: 3 years  SUBJECTIVE:                                                                                                                                                                                           SUBJECTIVE STATEMENT: Pt reports that she had ablation to L4-L5 yesterday. "Pain is gone, I just have procedural ache now".   "I look forward to coming to the pool"  POOL ACCESS: not currently a member anywhere.  May have coverage with [employer]  From initial evaluation: I have not been moving for the last 3 years due to multiple issues.  My back pain creeps up every once in a while, had an ablation 2 years ago, also fusion Lumbar spine in 2013. I am in the process of getting another ablation.  I have trouble standing and walking due to the back pain.  Bilateral shoulder surgeries in past that still bother me. Have lost 40 lb which has helped. Have problems with breathing.  PERTINENT HISTORY:  AS s/p TAVR, hypertension, chronic diastolic heart failure, rheumatoid arthritis on biologics, and mixed connective tissue disease, chronic shortness of breath  -Diffuse/interstitial lung disease, shortness of breath, mixed tissue connective tissue, Autoimmune disease, Fibrosis lung   PAIN:  Are you having pain? Yes: NPRS scale: current 0-1/10 Pain location: LB  Pain description: ache Aggravating factors: moving, walking standing Relieving factors: non movement  PRECAUTIONS: Fall Chronic SOB  RED FLAGS: None   WEIGHT BEARING RESTRICTIONS: No  FALLS:  Has patient fallen in last 6 months? No  LIVING ENVIRONMENT: Lives with: husband Lives in: House/apartment Stairs: Yes: External: 2 steps; on right going up Has following equipment at home: Single point cane  OCCUPATION: retired  PLOF:  Independent  PATIENT GOALS: walk for exercise (cardiologist); going on vacation where I have to walk some  NEXT MD VISIT: as needed  OBJECTIVE:  Note: Objective measures were completed at Evaluation unless otherwise noted.  DIAGNOSTIC FINDINGS:  N/a  PATIENT SURVEYS:  FOTO completed but did not save (??)  COGNITION: Overall cognitive status: Within functional limits for tasks assessed      MUSCLE LENGTH:  wfl  POSTURE: increased thoracic kyphosis and weight shift right slight bilat knee valgus   LUMBAR ROM:   AROM eval  Flexion full  Extension full  Right lateral flexion 25% limited  Left lateral flexion 25% limited  Right rotation   Left rotation    (Blank rows = not tested)  LOWER EXTREMITY ROM:     wfl  LOWER EXTREMITY MMT:    MMT Right eval Left eval  Hip flexion 42.7 44.9  Hip extension    Hip abduction 31.2 36.0  Hip adduction    Hip internal rotation    Hip external rotation    Knee flexion    Knee extension 33.1 35.1  Ankle dorsiflexion    Ankle plantarflexion    Ankle inversion    Ankle eversion     (Blank rows = not tested)   FUNCTIONAL TESTS:  5 times sit to stand: 15.47 Timed up and go (TUG): 13.17 4 stage: NBOS x 10 s; unable to complete semi-tandem, tandem or SLS Berg: 32/56  12/03/23: 5x STS-= 12.31s  GAIT: Distance walked: 400 ft Assistive device utilized: None Level of assistance: Complete Independence Comments:  a ntalgic, increased stance during swing bilat  TREATMENT  Pt seen for aquatic therapy today.  Treatment took place in water 3.5-4.75 ft in depth at the [facility] pool. Temp of water was 91.  Pt entered/exited the pool via stairs independently with bilat rail. -5x STS test (see above) - walking unsupported forward/ backward 4 laps - side stepping with arm addct x 1 laps  - Side stepping UE add/abd with rainbow hand floats x 3 laps - marching forward/ backward with reciprocal row with rainbow hand  floats x 1 lap - farmer carry with single yellow hand float under water at side, walking forward/ backward  - UE on wall:  hip abdct/ addct x 12 each LE; hip ext to touch toe x 10 each LE; heel raises x 10  -decompression position with noodle wrapped posteriorly across chest: cycling; hip add/abd  Pt requires the buoyancy and hydrostatic pressure of water for support, and to offload joints by unweighting joint load by at least 50 % in navel deep water and by at least 75-80% in chest to neck deep water.  Viscosity of the water is needed for resistance of strengthening. Water current perturbations provides challenge to standing balance requiring increased core activation.  PATIENT EDUCATION:  Education details:aquatic program progressions.   Person educated: Patient Education method: Explanation handout Education comprehension: verbalized understanding  HOME EXERCISE PROGRAM: tba  ASSESSMENT:  CLINICAL IMPRESSION: Pt with good tolerance to session, without increase in pain.  Pt demo improved 5x STS time; has met LTG4.  Encouraged pt to check out local pool in the next week.  Will begin creating aquatic HEP in next visit or two. Progressing well towards goals.     From Initial Evaluation: Patient is a 73 y.o. f who was seen today for physical therapy evaluation and treatment for generalized weakness. She has an extensive Pmhx which includes RA, LB surgeries, lung dysfunction and CHF.  She recently has had an exacerbation of LBP which she will be receiving an ablation.  She has had bilateral shoulder surgeries.  Her muscle weakness therefore is multifactorial.  She reports lack of exercise/mobility for the past 3 years and has a goal to increase her toleration to activity and improve strength to be able to be more active and travel. She does state that her breathing is her most limiting issue with toleration to activity.  She is a good candidate for skilled PT to improve all areas of deficit.   She will begin with aquatic only intervention to demonstrate that due to her dysfunctions she will tolerate the water medium well to meet goals.  OBJECTIVE IMPAIRMENTS: Abnormal gait, decreased activity tolerance, decreased balance, decreased endurance, decreased mobility, difficulty walking, decreased strength, impaired flexibility, postural dysfunction, obesity, and pain.   ACTIVITY LIMITATIONS: standing, stairs, and locomotion level  PARTICIPATION LIMITATIONS: cleaning, laundry, shopping, community activity, and yard work Programme researcher, broadcasting/film/video  PERSONAL FACTORS: Past/current experiences, Time since onset of injury/illness/exacerbation, and 3+ comorbidities: see PmHx  are also affecting patient's functional outcome.   REHAB POTENTIAL: Good  CLINICAL DECISION MAKING: Evolving/moderate complexity  EVALUATION COMPLEXITY: Moderate   GOALS: Goals reviewed with patient? Yes  SHORT TERM GOALS: Target date: 12/11/23  Pt will tolerate full aquatic sessions consistently without increase in pain and with improving function to demonstrate good toleration and effectiveness of intervention.  Baseline: Goal status: Met 11/30/23  2.  Pt will tolerate walking to and from setting (567ft ea way), engaging in aquatic therapy session without limiting pain or fatigue Baseline: TBD Goal status: In process 11/30/23    LONG TERM GOALS: Target date: 12/25/23  Pt to improve on LEFS by at least 9 points to demonstrate improved functional ability Baseline:  Goal status: INITIAL  2.  Pt will improve on Berg balance test to >/= 50/56 to demonstrate a decrease in fall risk. Baseline: 32/56 Goal status: INITIAL  3.  Pt will be indep with final aquatic and land (as approp)HEP for continued management of condition with verbal plan on consistency (gym and/or pool access vs indep at home). Baseline: none Goal status: INITIAL  4.  Pt will improve on 5 X STS test to <or= 12s  to demonstrate improving functional lower  extremity strength, transitional movements, and balance Baseline: 15.47 Goal status: MET - 12/03/23  5.  Pt to acknowledge ability to tolerate/plan a vacation  Baseline: too fatigable to tolerate Goal status: INITIAL  6.  Pt will improve strength in all areas listed by at least 5 lbs to demonstrate improved overall physical function Baseline: see chart Goal status: INITIAL  PLAN:  PT FREQUENCY: 2x/week  PT DURATION: 8 weeks  PLANNED INTERVENTIONS: 97164- PT Re-evaluation, 97110-Therapeutic exercises, 97530- Therapeutic activity, 97112- Neuromuscular re-education, 97535- Self Care, 16109- Manual therapy, [id number]- Gait training, [phone]- Orthotic Fit/training, [phone]- Aquatic Therapy, [phone]- Ionotophoresis 4mg /ml Dexamethasone, Patient/Family education, Balance training, Stair training, Taping, Dry Needling, DME instructions, Cryotherapy, and Moist heat.  PLAN FOR NEXT SESSION: Aquatic only: general strengthening.Gentle ROM/stretching; balance retraining, core stability

## 2023-12-08 ENCOUNTER — Encounter (HOSPITAL_BASED_OUTPATIENT_CLINIC_OR_DEPARTMENT_OTHER): Payer: Self-pay | Admitting: Physical Therapy

## 2023-12-08 ENCOUNTER — Ambulatory Visit (HOSPITAL_BASED_OUTPATIENT_CLINIC_OR_DEPARTMENT_OTHER): Payer: Medicare Other | Admitting: Physical Therapy

## 2023-12-08 DIAGNOSIS — M6281 Muscle weakness (generalized): Secondary | ICD-10-CM | POA: Diagnosis not present

## 2023-12-08 DIAGNOSIS — R2681 Unsteadiness on feet: Secondary | ICD-10-CM

## 2023-12-08 DIAGNOSIS — M5459 Other low back pain: Secondary | ICD-10-CM

## 2023-12-08 DIAGNOSIS — R293 Abnormal posture: Secondary | ICD-10-CM

## 2023-12-08 NOTE — Therapy (Signed)
OUTPATIENT PHYSICAL THERAPY THORACOLUMBAR TREATMENT   Patient Name: Katherine Waters MRN: [id number] DOB:[birth year], 73 y.o., female Today's Date: 12/08/2023  END OF SESSION:  PT End of Session - 12/08/23 1057     Visit Number 7    Date for PT Re-Evaluation 12/25/23    Authorization Type MCR    Progress Note Due on Visit 10    PT Start Time 1100    PT Stop Time 1140    PT Time Calculation (min) 40 min    Activity Tolerance Patient tolerated treatment well    Behavior During Therapy WFL for tasks assessed/performed             Past Medical History:  Diagnosis Date   Arthritis    osteoarthritis   Asthma    Seasonal   Connective tissue disease (HCC)    CREST syndrome (HCC)    Depression    Diabetes 1.5, managed as type 2 (HCC)    Dyspnea    Fibromyalgia    GERD (gastroesophageal reflux disease)    History of migraine headaches    Horseshoe kidney    Hyperlipidemia    Hypertension    Inflammatory arthritis    PONV (postoperative nausea and vomiting)    Raynaud disease    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 Excell Seltzer and Dr. Cornelius Moras    Severe aortic stenosis    Sleep apnea    Mild, CPAP wore for one year   Past Surgical History:  Procedure Laterality Date   ABDOMINAL HYSTERECTOMY     BREAST BIOPSY Right    CARDIAC CATHETERIZATION     CATARACT EXTRACTION, BILATERAL     COLONOSCOPY WITH ESOPHAGOGASTRODUODENOSCOPY (EGD) AND ESOPHAGEAL DILATION (ED)     2016   INTRAOPERATIVE TRANSTHORACIC ECHOCARDIOGRAM Left 02/12/2021   Procedure: INTRAOPERATIVE TRANSTHORACIC ECHOCARDIOGRAM;  Surgeon: Tonny Bollman, MD;  Location: [facility] OR;  Service: Open Heart Surgery;  Laterality: Left;   LAPAROSCOPIC BILATERAL SALPINGO OOPHERECTOMY Bilateral 04/02/2022   Procedure: LAPAROSCOPIC BILATERAL SALPINGO OOPHORECTOMY;  Surgeon: Gerald Leitz, MD;  Location: [facility] OR;  Service: Gynecology;  Laterality:  Bilateral;   LUMBAR FUSION  2013   POSTERIOR TIBIAL TENDON REPAIR Left 2019   RIGHT/LEFT HEART CATH AND CORONARY ANGIOGRAPHY N/A 01/29/2021   Procedure: RIGHT/LEFT HEART CATH AND CORONARY ANGIOGRAPHY;  Surgeon: Lyn Records, MD;  Location: [facility];  Service: Cardiovascular;  Laterality: N/A;   ROTATOR CUFF REPAIR Right    SHOULDER ARTHROSCOPY WITH DISTAL CLAVICLE RESECTION Left 12/09/2022   Procedure: SHOULDER ARTHROSCOPY WITH DISTAL CLAVICLE RESECTION AND SUBACROMIAL DECOMPRESSION,LABRAL DEBRIDEMENT;  Surgeon: Francena Hanly, MD;  Location: [facility] ORS;  Service: Orthopedics;  Laterality: Left;  exparel block 120   SHOULDER ARTHROSCOPY WITH ROTATOR CUFF REPAIR Left 12/09/2022   Procedure: SHOULDER ARTHROSCOPY WITH DERIDEMENT AND  ROTATOR CUFF REPAIR;  Surgeon: Francena Hanly, MD;  Location: [facility];  Service: Orthopedics;  Laterality: Left;  exparel block 120   TRANSCATHETER AORTIC VALVE REPLACEMENT, TRANSFEMORAL N/A 02/12/2021   Procedure: TRANSCATHETER AORTIC VALVE REPLACEMENT, TRANSFEMORAL;  Surgeon: Tonny Bollman, MD;  Location: [facility] OR;  Service: Open Heart Surgery;  Laterality: N/A;   TUBAL LIGATION     ULTRASOUND GUIDANCE FOR VASCULAR ACCESS Bilateral 02/12/2021   Procedure: ULTRASOUND GUIDANCE FOR VASCULAR ACCESS;  Surgeon: Tonny Bollman, MD;  Location: 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   Raynaud disease    GERD (gastroesophageal reflux disease)    Fibromyalgia    Depression    Severe aortic stenosis    Hypertension    Connective tissue disease (HCC)    Hyperlipidemia    Asthma     PCP: bROOK hESTER np  REFERRING PROVIDER: Luciano Cutter, MD   REFERRING DIAG: R29.898 (ICD-10-CM) - Muscular deconditioning    Rationale for Evaluation and Treatment: Rehabilitation  THERAPY DIAG:  Muscle weakness (generalized)  Abnormal posture  Unsteadiness on feet  Other low back pain  ONSET DATE: 3 years  SUBJECTIVE:                                                                                                                                                                                           SUBJECTIVE STATEMENT: Pt reports feeling good. Was able to clean a room and lifted something and felt ok"  POOL ACCESS: not currently a member anywhere.  May have coverage with [employer]  From initial evaluation: I have not been moving for the last 3 years due to multiple issues.  My back pain creeps up every once in a while, had an ablation 2 years ago, also fusion Lumbar spine in 2013. I am in the process of getting another ablation.  I have trouble standing and walking due to the back pain.  Bilateral shoulder surgeries in past that still bother me. Have lost 40 lb which has helped. Have problems with breathing.  PERTINENT HISTORY:  AS s/p TAVR, hypertension, chronic diastolic heart failure, rheumatoid arthritis on biologics, and mixed connective tissue disease, chronic shortness of breath  -Diffuse/interstitial lung disease, shortness of breath, mixed tissue connective tissue, Autoimmune disease, Fibrosis lung   PAIN:  Are you having pain? Yes: NPRS scale: current 0-1/10 Pain location: LB  Pain description: ache Aggravating factors: moving, walking standing Relieving factors: non movement  PRECAUTIONS: Fall Chronic SOB  RED FLAGS: None   WEIGHT BEARING RESTRICTIONS: No  FALLS:  Has patient fallen in last 6 months? No  LIVING ENVIRONMENT: Lives with: husband Lives in: House/apartment Stairs: Yes: External: 2 steps; on right going up Has following equipment at home: Single point cane  OCCUPATION: retired  PLOF: Independent  PATIENT GOALS: walk for exercise (cardiologist); going  on vacation where I have to walk some  NEXT MD VISIT: as needed  OBJECTIVE:  Note: Objective measures were completed at Evaluation unless otherwise  noted.  DIAGNOSTIC FINDINGS:  N/a  PATIENT SURVEYS:  FOTO completed but did not save (??)  COGNITION: Overall cognitive status: Within functional limits for tasks assessed      MUSCLE LENGTH:  wfl  POSTURE: increased thoracic kyphosis and weight shift right slight bilat knee valgus   LUMBAR ROM:   AROM eval  Flexion full  Extension full  Right lateral flexion 25% limited  Left lateral flexion 25% limited  Right rotation   Left rotation    (Blank rows = not tested)  LOWER EXTREMITY ROM:     wfl  LOWER EXTREMITY MMT:    MMT Right eval Left eval  Hip flexion 42.7 44.9  Hip extension    Hip abduction 31.2 36.0  Hip adduction    Hip internal rotation    Hip external rotation    Knee flexion    Knee extension 33.1 35.1  Ankle dorsiflexion    Ankle plantarflexion    Ankle inversion    Ankle eversion     (Blank rows = not tested)   FUNCTIONAL TESTS:  5 times sit to stand: 15.47 Timed up and go (TUG): 13.17 4 stage: NBOS x 10 s; unable to complete semi-tandem, tandem or SLS Berg: 32/56  12/03/23: 5x STS-= 12.31s  GAIT: Distance walked: 400 ft Assistive device utilized: None Level of assistance: Complete Independence Comments:  a ntalgic, increased stance during swing bilat  TREATMENT  Pt seen for aquatic therapy today.  Treatment took place in water 3.5-4.75 ft in depth at the [facility] pool. Temp of water was 91.  Pt entered/exited the pool via stairs independently with bilat rail.    Exercises - Hand Buoy Carry  forward, back and side ways bilateral then unilateral carry - Side Stepping with Hand Floats ue add/abd - Standing March with Leg Kick  ue support yellow HB x 2 widths (good challenge) - Ue support wall:Heel Toe Raises at [facility] ; hip add/abd; hip ext; squats x 12-15 - Noodle  press 1/2 noodle wide stance then staggered 8-10ea.  Cues for abd bracing - cycling on noodle: hip add/abd; hip flex/ext     Pt requires the buoyancy and hydrostatic pressure of water for support, and to offload joints by unweighting joint load by at least 50 % in navel deep water and by at least 75-80% in chest to neck deep water.  Viscosity of the water is needed for resistance of strengthening. Water current perturbations provides challenge to standing balance requiring increased core activation.  PATIENT EDUCATION:  Education details:aquatic program progressions.   Person educated: Patient Education method: Explanation handout Education comprehension: verbalized understanding  HOME EXERCISE PROGRAM: Aquatic HEP This aquatic home exercise program from [employer] utilizes pictures from land based exercises, but has been adapted prior to lamination and issuance.    Access Code: [email or web address] URL: [email or web address] Date: 12/08/2023 Prepared by: Geni Bers  Exercises - Hand Buoy Carry  - 1 x daily - 1-3 x weekly - Standing March with Leg Kick  - 1 x daily - 1-3 x weekly - Side Stepping with Hand Floats  - 1 x daily - 1-3 x weekly - Heel Toe Raises at Pool Wall  - 1 x daily - 1-3 x weekly - 1-2 sets - 10 reps - Standing Hip Flexion Extension at [employer]  - 1-2 x daily - 1-3 x weekly - 3 sets - 10 reps - Standing Hip Abduction Adduction at Pool Wall  - 1 x daily - 1-3 x  weekly - 1-2 sets - 10 reps - Squat  - 1 x daily - 1-3 x weekly - 1-2 sets - 10 reps - Noodle press  - 1 x daily - 1-3 x weekly - 1-2 sets - 10 reps - Seated Straddle on Flotation Forward Breast Stroke Arms and Bicycle Legs  - 1 x daily - 1-3 x weekly  ASSESSMENT:  CLINICAL IMPRESSION: Pt with very low pain sensitivity.  HEP creation begun.  She is directed through exercises with cuing to ensure understanding of terminology used with HEP.  She completes well, with little discomfort.  She is well  challenged with marching with knee kick to maintain standing balance. Will continue instructing on HEP.  Pt will be gaining access to pool in next week.  Plan to dc holds firm for end of next week.     From Initial Evaluation: Patient is a 73 y.o. f who was seen today for physical therapy evaluation and treatment for generalized weakness. She has an extensive Pmhx which includes RA, LB surgeries, lung dysfunction and CHF.  She recently has had an exacerbation of LBP which she will be receiving an ablation.  She has had bilateral shoulder surgeries.  Her muscle weakness therefore is multifactorial.  She reports lack of exercise/mobility for the past 3 years and has a goal to increase her toleration to activity and improve strength to be able to be more active and travel. She does state that her breathing is her most limiting issue with toleration to activity.  She is a good candidate for skilled PT to improve all areas of deficit.  She will begin with aquatic only intervention to demonstrate that due to her dysfunctions she will tolerate the water medium well to meet goals.  OBJECTIVE IMPAIRMENTS: Abnormal gait, decreased activity tolerance, decreased balance, decreased endurance, decreased mobility, difficulty walking, decreased strength, impaired flexibility, postural dysfunction, obesity, and pain.   ACTIVITY LIMITATIONS: standing, stairs, and locomotion level  PARTICIPATION LIMITATIONS: cleaning, laundry, shopping, community activity, and yard work Programme researcher, broadcasting/film/video  PERSONAL FACTORS: Past/current experiences, Time since onset of injury/illness/exacerbation, and 3+ comorbidities: see PmHx  are also affecting patient's functional outcome.   REHAB POTENTIAL: Good  CLINICAL DECISION MAKING: Evolving/moderate complexity  EVALUATION COMPLEXITY: Moderate   GOALS: Goals reviewed with patient? Yes  SHORT TERM GOALS: Target date: 12/11/23  Pt will tolerate full aquatic sessions consistently without  increase in pain and with improving function to demonstrate good toleration and effectiveness of intervention.  Baseline: Goal status: Met 11/30/23  2.  Pt will tolerate walking to and from setting (580ft ea way), engaging in aquatic therapy session without limiting pain or fatigue Baseline: TBD Goal status: In process 11/30/23    LONG TERM GOALS: Target date: 12/25/23  Pt to improve on LEFS by at least 9 points to demonstrate improved functional ability Baseline:  Goal status: INITIAL  2.  Pt will improve on Berg balance test to >/= 50/56 to demonstrate a decrease in fall risk. Baseline: 32/56 Goal status: INITIAL  3.  Pt will be indep with final aquatic and land (as approp)HEP for continued management of condition with verbal plan on consistency (gym and/or pool access vs indep at home). Baseline: none Goal status: In progress 12/08/23  4.  Pt will improve on 5 X STS test to <or= 12s  to demonstrate improving functional lower extremity strength, transitional movements, and balance Baseline: 15.47 Goal status: MET - 12/03/23  5.  Pt to acknowledge ability to tolerate/plan a vacation  Baseline: too fatigable to tolerate Goal status: INITIAL  6.  Pt will improve strength in all areas listed by at least 5 lbs to demonstrate improved overall physical function Baseline: see chart Goal status: INITIAL  PLAN:  PT FREQUENCY: 2x/week  PT DURATION: 8 weeks  PLANNED INTERVENTIONS: 97164- PT Re-evaluation, 97110-Therapeutic exercises, 97530- Therapeutic activity, 97112- Neuromuscular re-education, 97535- Self Care, 16109- Manual therapy, [id number]- Gait training, [phone]- Orthotic Fit/training, [phone]- Aquatic Therapy, [phone]- Ionotophoresis 4mg /ml Dexamethasone, Patient/Family education, Balance training, Stair training, Taping, Dry Needling, DME instructions, Cryotherapy, and Moist heat.  PLAN FOR NEXT SESSION: Aquatic only: general strengthening.Gentle ROM/stretching; balance retraining, core  stability   Rushie Chestnut) Breindy Meadow MPT 12/08/23 12:26 PM [facility] [address] [place], [place], [address] Phone: [phone]   Fax:  [phone]

## 2023-12-10 ENCOUNTER — Encounter (HOSPITAL_BASED_OUTPATIENT_CLINIC_OR_DEPARTMENT_OTHER): Payer: Self-pay | Admitting: Physical Therapy

## 2023-12-10 ENCOUNTER — Ambulatory Visit (HOSPITAL_BASED_OUTPATIENT_CLINIC_OR_DEPARTMENT_OTHER): Payer: Medicare Other | Admitting: Physical Therapy

## 2023-12-10 DIAGNOSIS — R293 Abnormal posture: Secondary | ICD-10-CM

## 2023-12-10 DIAGNOSIS — M5459 Other low back pain: Secondary | ICD-10-CM

## 2023-12-10 DIAGNOSIS — M6281 Muscle weakness (generalized): Secondary | ICD-10-CM

## 2023-12-10 DIAGNOSIS — R2681 Unsteadiness on feet: Secondary | ICD-10-CM

## 2023-12-10 NOTE — Therapy (Signed)
OUTPATIENT PHYSICAL THERAPY THORACOLUMBAR TREATMENT   Patient Name: Katherine Waters MRN: [id number] DOB:09-26-1951, 73 y.o., female Today's Date: 12/10/2023  END OF SESSION:  PT End of Session - 12/10/23 1100     Visit Number 8    Date for PT Re-Evaluation 12/25/23    Authorization Type MCR    Progress Note Due on Visit 10    PT Start Time 1057    PT Stop Time 1140    PT Time Calculation (min) 43 min    Activity Tolerance Patient tolerated treatment well    Behavior During Therapy WFL for tasks assessed/performed             Past Medical History:  Diagnosis Date   Arthritis    osteoarthritis   Asthma    Seasonal   Connective tissue disease (HCC)    CREST syndrome (HCC)    Depression    Diabetes 1.5, managed as type 2 (HCC)    Dyspnea    Fibromyalgia    GERD (gastroesophageal reflux disease)    History of migraine headaches    Horseshoe kidney    Hyperlipidemia    Hypertension    Inflammatory arthritis    PONV (postoperative nausea and vomiting)    Raynaud disease    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 Excell Seltzer and Dr. Cornelius Moras    Severe aortic stenosis    Sleep apnea    Mild, CPAP wore for one year   Past Surgical History:  Procedure Laterality Date   ABDOMINAL HYSTERECTOMY     BREAST BIOPSY Right    CARDIAC CATHETERIZATION     CATARACT EXTRACTION, BILATERAL     COLONOSCOPY WITH ESOPHAGOGASTRODUODENOSCOPY (EGD) AND ESOPHAGEAL DILATION (ED)     2016   INTRAOPERATIVE TRANSTHORACIC ECHOCARDIOGRAM Left 02/12/2021   Procedure: INTRAOPERATIVE TRANSTHORACIC ECHOCARDIOGRAM;  Surgeon: Tonny Bollman, MD;  Location: [facility] OR;  Service: Open Heart Surgery;  Laterality: Left;   LAPAROSCOPIC BILATERAL SALPINGO OOPHERECTOMY Bilateral 04/02/2022   Procedure: LAPAROSCOPIC BILATERAL SALPINGO OOPHORECTOMY;  Surgeon: Gerald Leitz, MD;  Location: [facility] OR;  Service: Gynecology;  Laterality:  Bilateral;   LUMBAR FUSION  2013   POSTERIOR TIBIAL TENDON REPAIR Left 2019   RIGHT/LEFT HEART CATH AND CORONARY ANGIOGRAPHY N/A 01/29/2021   Procedure: RIGHT/LEFT HEART CATH AND CORONARY ANGIOGRAPHY;  Surgeon: Lyn Records, MD;  Location: [facility] IN[facility];  Service: Cardiovascular;  Laterality: N/A;   ROTATOR CUFF REPAIR Right    SHOULDER ARTHROSCOPY WITH DISTAL CLAVICLE RESECTION Left 12/09/2022   Procedure: SHOULDER ARTHROSCOPY WITH DISTAL CLAVICLE RESECTION AND SUBACROMIAL DECOMPRESSION,LABRAL DEBRIDEMENT;  Surgeon: Francena Hanly, MD;  Location: [facility] ORS;  Service: Orthopedics;  Laterality: Left;  exparel block 120   SHOULDER ARTHROSCOPY WITH ROTATOR CUFF REPAIR Left 12/09/2022   Procedure: SHOULDER ARTHROSCOPY WITH DERIDEMENT AND  ROTATOR CUFF REPAIR;  Surgeon: Francena Hanly, MD;  Location: [facility] ORS;  Service: Orthopedics;  Laterality: Left;  exparel block 120   TRANSCATHETER AORTIC VALVE REPLACEMENT, TRANSFEMORAL N/A 02/12/2021   Procedure: TRANSCATHETER AORTIC VALVE REPLACEMENT, TRANSFEMORAL;  Surgeon: Tonny Bollman, MD;  Location: 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   Raynaud disease    GERD (gastroesophageal reflux disease)    Fibromyalgia    Depression    Severe aortic stenosis    Hypertension    Connective tissue disease (HCC)    Hyperlipidemia    Asthma     PCP: bROOK hESTER np  REFERRING PROVIDER: Luciano Cutter, MD   REFERRING DIAG: R29.898 (ICD-10-CM) - Muscular deconditioning    Rationale for Evaluation and Treatment: Rehabilitation  THERAPY DIAG:  Muscle weakness (generalized)  Abnormal posture  Unsteadiness on feet  Other low back pain  ONSET DATE: 3 years  SUBJECTIVE:                                                                                                                                                                                           SUBJECTIVE STATEMENT: Pt reports she is doing well; plans to join [employer] next week.  Feels energized after she leaves pool sessions.   POOL ACCESS: plans to join [employer].   From initial evaluation: I have not been moving for the last 3 years due to multiple issues.  My back pain creeps up every once in a while, had an ablation 2 years ago, also fusion Lumbar spine in 2013. I am in the process of getting another ablation.  I have trouble standing and walking due to the back pain.  Bilateral shoulder surgeries in past that still bother me. Have lost 40 lb which has helped. Have problems with breathing.  PERTINENT HISTORY:  AS s/p TAVR, hypertension, chronic diastolic heart failure, rheumatoid arthritis on biologics, and mixed connective tissue disease, chronic shortness of breath  -Diffuse/interstitial lung disease, shortness of breath, mixed tissue connective tissue, Autoimmune disease, Fibrosis lung   PAIN:  Are you having pain? Yes: NPRS scale: current 1/10 Pain location: L glute Pain description: ache Aggravating factors: moving, walking standing Relieving factors: non movement  PRECAUTIONS: Fall Chronic SOB  RED FLAGS: None   WEIGHT BEARING RESTRICTIONS: No  FALLS:  Has patient fallen in last 6 months? No  LIVING ENVIRONMENT: Lives with: husband Lives in: House/apartment Stairs: Yes: External: 2 steps; on right going up Has following equipment at home: Single point cane  OCCUPATION: retired  PLOF: Independent  PATIENT GOALS: walk for exercise (cardiologist); going on vacation  where I have to walk some  NEXT MD VISIT: as needed  OBJECTIVE:  Note: Objective measures were completed at Evaluation unless otherwise noted.  DIAGNOSTIC  FINDINGS:  N/a  PATIENT SURVEYS:  FOTO completed but did not save (??)  COGNITION: Overall cognitive status: Within functional limits for tasks assessed      MUSCLE LENGTH:  wfl  POSTURE: increased thoracic kyphosis and weight shift right slight bilat knee valgus   LUMBAR ROM:   AROM eval  Flexion full  Extension full  Right lateral flexion 25% limited  Left lateral flexion 25% limited  Right rotation   Left rotation    (Blank rows = not tested)  LOWER EXTREMITY ROM:     wfl  LOWER EXTREMITY MMT:    MMT Right eval Left eval  Hip flexion 42.7 44.9  Hip extension    Hip abduction 31.2 36.0  Hip adduction    Hip internal rotation    Hip external rotation    Knee flexion    Knee extension 33.1 35.1  Ankle dorsiflexion    Ankle plantarflexion    Ankle inversion    Ankle eversion     (Blank rows = not tested)   FUNCTIONAL TESTS:  5 times sit to stand: 15.47 Timed up and go (TUG): 13.17 4 stage: NBOS x 10 s; unable to complete semi-tandem, tandem or SLS Berg: 32/56  12/03/23: 5x STS-= 12.31s  GAIT: Distance walked: 400 ft Assistive device utilized: None Level of assistance: Complete Independence Comments:  a ntalgic, increased stance during swing bilat  TREATMENT  Pt seen for aquatic therapy today.  Treatment took place in water 3.5-4.75 ft in depth at the [facility] pool. Temp of water was 91.  Pt entered/exited the pool via stairs independently with bilat rail.  - walking forward/ backward, multiple laps with breast stroke arms and reciprocal arm swing - side stepping with rainbow hand floats with UE addct/ abdct -> repeated with UE without floats  - hand float carry (single rainbow), walking backwards/ forwards with core engaged - marching with reciprocal row motion  - UE on wall:   hip abdct/ addct 2 x 10, hip flex/ ext 2 x 10; heel raises x 10; squats x 8 - squats pushing single rainbow hand float under water x 10 - return to walking forward / backward without support (in between wall exercises)  - full hollow noodle (too difficult)-> kickboard pull down under water in wide stance then staggered 5 each.   - staggered stance row with kick board x 8 each LE forward - cycling on noodle: hip add/abd; hip flex/ext     Pt requires the buoyancy and hydrostatic pressure of water for support, and to offload joints by unweighting joint load by at least 50 % in navel deep water and by at least 75-80% in chest to neck deep water.  Viscosity of the water is needed for resistance of strengthening. Water current perturbations provides challenge to standing balance requiring increased core activation.  PATIENT EDUCATION:  Education details:aquatic program progressions.   Person educated: Patient Education method: Explanation handout Education comprehension: verbalized understanding  HOME EXERCISE PROGRAM: Aquatic Access Code: [email or web address] URL: [email or web address] This aquatic home exercise program from [employer] utilizes pictures from land based exercises, but has been adapted prior to lamination and issuance.     ASSESSMENT:  CLINICAL IMPRESSION: Pt was directed through exercises of HEP (not issued yet) and each exercise was explained of how to progress.  She completed exercises well, without any increase in symptoms. She complained of some discomfort in hands after gripping kick board for 2 exercises. Will plan to issue laminated HEP next visit and  finalize in next 2 session.  Continue d/c planning and assess unmet goals.      From Initial Evaluation: Patient is a 73 y.o. f who was seen today for physical therapy evaluation and treatment for generalized weakness. She has an extensive Pmhx which includes RA, LB surgeries, lung dysfunction and CHF.  She recently  has had an exacerbation of LBP which she will be receiving an ablation.  She has had bilateral shoulder surgeries.  Her muscle weakness therefore is multifactorial.  She reports lack of exercise/mobility for the past 3 years and has a goal to increase her toleration to activity and improve strength to be able to be more active and travel. She does state that her breathing is her most limiting issue with toleration to activity.  She is a good candidate for skilled PT to improve all areas of deficit.  She will begin with aquatic only intervention to demonstrate that due to her dysfunctions she will tolerate the water medium well to meet goals.  OBJECTIVE IMPAIRMENTS: Abnormal gait, decreased activity tolerance, decreased balance, decreased endurance, decreased mobility, difficulty walking, decreased strength, impaired flexibility, postural dysfunction, obesity, and pain.   ACTIVITY LIMITATIONS: standing, stairs, and locomotion level  PARTICIPATION LIMITATIONS: cleaning, laundry, shopping, community activity, and yard work Programme researcher, broadcasting/film/video  PERSONAL FACTORS: Past/current experiences, Time since onset of injury/illness/exacerbation, and 3+ comorbidities: see PmHx  are also affecting patient's functional outcome.   REHAB POTENTIAL: Good  CLINICAL DECISION MAKING: Evolving/moderate complexity  EVALUATION COMPLEXITY: Moderate   GOALS: Goals reviewed with patient? Yes  SHORT TERM GOALS: Target date: 12/11/23  Pt will tolerate full aquatic sessions consistently without increase in pain and with improving function to demonstrate good toleration and effectiveness of intervention.  Baseline: Goal status: Met 11/30/23  2.  Pt will tolerate walking to and from setting (531ft ea way), engaging in aquatic therapy session without limiting pain or fatigue Baseline: TBD Goal status: In process 11/30/23    LONG TERM GOALS: Target date: 12/25/23  Pt to improve on LEFS by at least 9 points to demonstrate improved  functional ability Baseline:  Goal status: INITIAL  2.  Pt will improve on Berg balance test to >/= 50/56 to demonstrate a decrease in fall risk. Baseline: 32/56 Goal status: INITIAL  3.  Pt will be indep with final aquatic and land (as approp)HEP for continued management of condition with verbal plan on consistency (gym and/or pool access vs indep at home). Baseline: none Goal status: In progress 12/08/23  4.  Pt will improve on 5 X STS test to <or= 12s  to demonstrate improving functional lower extremity strength, transitional movements, and balance Baseline: 15.47 Goal status: MET - 12/03/23  5.  Pt to acknowledge ability to tolerate/plan a vacation  Baseline: too fatigable to tolerate Goal status: INITIAL  6.  Pt will improve strength in all areas listed by at least 5 lbs to demonstrate improved overall physical function Baseline: see chart Goal status: INITIAL  PLAN:  PT FREQUENCY: 2x/week  PT DURATION: 8 weeks  PLANNED INTERVENTIONS: 97164- PT Re-evaluation, 97110-Therapeutic exercises, 97530- Therapeutic activity, 97112- Neuromuscular re-education, 97535- Self Care, 62376- Manual therapy, [id number]- Gait training, [phone]- Orthotic Fit/training, [phone]- Aquatic Therapy, [phone]- Ionotophoresis 4mg /ml Dexamethasone, Patient/Family education, Balance training, Stair training, Taping, Dry Needling, DME instructions, Cryotherapy, and Moist heat.  PLAN FOR NEXT SESSION: Aquatic only: general strengthening.Gentle ROM/stretching; balance retraining, core stability  Mayer Camel, PTA 12/10/23 11:50 AM [facility] [address] [place], [place],  [address] Phone: [phone]   Fax:  [phone]

## 2023-12-12 ENCOUNTER — Other Ambulatory Visit: Payer: Self-pay | Admitting: Cardiovascular Disease

## 2023-12-15 ENCOUNTER — Ambulatory Visit (HOSPITAL_BASED_OUTPATIENT_CLINIC_OR_DEPARTMENT_OTHER): Payer: Medicare Other | Admitting: Physical Therapy

## 2023-12-15 ENCOUNTER — Encounter (HOSPITAL_BASED_OUTPATIENT_CLINIC_OR_DEPARTMENT_OTHER): Payer: Self-pay | Admitting: Physical Therapy

## 2023-12-15 DIAGNOSIS — M6281 Muscle weakness (generalized): Secondary | ICD-10-CM

## 2023-12-15 DIAGNOSIS — R2681 Unsteadiness on feet: Secondary | ICD-10-CM

## 2023-12-15 DIAGNOSIS — R293 Abnormal posture: Secondary | ICD-10-CM

## 2023-12-15 NOTE — Therapy (Signed)
 OUTPATIENT PHYSICAL THERAPY THORACOLUMBAR TREATMENT   Patient Name: Katherine Waters MRN: [id number] DOB:03-27-1951, 73 y.o., female Today's Date: 12/15/2023  END OF SESSION:  PT End of Session - 12/15/23 1103     Visit Number 9    Date for PT Re-Evaluation 12/25/23    Authorization Type MCR    Progress Note Due on Visit 10    PT Start Time 1101    PT Stop Time 1154    PT Time Calculation (min) 53 min    Activity Tolerance Patient tolerated treatment well    Behavior During Therapy WFL for tasks assessed/performed             Past Medical History:  Diagnosis Date   Arthritis    osteoarthritis   Asthma    Seasonal   Connective tissue disease (HCC)    CREST syndrome (HCC)    Depression    Diabetes 1.5, managed as type 2 (HCC)    Dyspnea    Fibromyalgia    GERD (gastroesophageal reflux disease)    History of migraine headaches    Horseshoe kidney    Hyperlipidemia    Hypertension    Inflammatory arthritis    PONV (postoperative nausea and vomiting)    Raynaud disease    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 Excell Seltzer and Dr. Cornelius Moras    Severe aortic stenosis    Sleep apnea    Mild, CPAP wore for one year   Past Surgical History:  Procedure Laterality Date   ABDOMINAL HYSTERECTOMY     BREAST BIOPSY Right    CARDIAC CATHETERIZATION     CATARACT EXTRACTION, BILATERAL     COLONOSCOPY WITH ESOPHAGOGASTRODUODENOSCOPY (EGD) AND ESOPHAGEAL DILATION (ED)     2016   INTRAOPERATIVE TRANSTHORACIC ECHOCARDIOGRAM Left 02/12/2021   Procedure: INTRAOPERATIVE TRANSTHORACIC ECHOCARDIOGRAM;  Surgeon: Tonny Bollman, MD;  Location: [facility] OR;  Service: Open Heart Surgery;  Laterality: Left;   LAPAROSCOPIC BILATERAL SALPINGO OOPHERECTOMY Bilateral 04/02/2022   Procedure: LAPAROSCOPIC BILATERAL SALPINGO OOPHORECTOMY;  Surgeon: Gerald Leitz, MD;  Location: [facility] OR;  Service: Gynecology;  Laterality:  Bilateral;   LUMBAR FUSION  2013   POSTERIOR TIBIAL TENDON REPAIR Left 2019   RIGHT/LEFT HEART CATH AND CORONARY ANGIOGRAPHY N/A 01/29/2021   Procedure: RIGHT/LEFT HEART CATH AND CORONARY ANGIOGRAPHY;  Surgeon: Lyn Records, MD;  Location: [facility];  Service: Cardiovascular;  Laterality: N/A;   ROTATOR CUFF REPAIR Right    SHOULDER ARTHROSCOPY WITH DISTAL CLAVICLE RESECTION Left 12/09/2022   Procedure: SHOULDER ARTHROSCOPY WITH DISTAL CLAVICLE RESECTION AND SUBACROMIAL DECOMPRESSION,LABRAL DEBRIDEMENT;  Surgeon: Francena Hanly, MD;  Location: [facility] ORS;  Service: Orthopedics;  Laterality: Left;  exparel block 120   SHOULDER ARTHROSCOPY WITH ROTATOR CUFF REPAIR Left 12/09/2022   Procedure: SHOULDER ARTHROSCOPY WITH DERIDEMENT AND  ROTATOR CUFF REPAIR;  Surgeon: Francena Hanly, MD;  Location: [facility] ORS;  Service: Orthopedics;  Laterality: Left;  exparel block 120   TRANSCATHETER AORTIC VALVE REPLACEMENT, TRANSFEMORAL N/A 02/12/2021   Procedure: TRANSCATHETER AORTIC VALVE REPLACEMENT, TRANSFEMORAL;  Surgeon: Tonny Bollman, MD;  Location: [facility] OR;  Service: Open Heart Surgery;  Laterality: N/A;   TUBAL LIGATION     ULTRASOUND GUIDANCE FOR VASCULAR ACCESS Bilateral 02/12/2021   Procedure: ULTRASOUND GUIDANCE FOR VASCULAR ACCESS;  Surgeon: Tonny Bollman, MD;  Location: [facility] OR;  Service: Open Heart Surgery;  Laterality: Bilateral;   WISDOM TOOTH EXTRACTION  1973  Patient Active Problem List   Diagnosis Date Noted   Ovarian mass, left 04/02/2022   Transient complete heart block (HCC) 02/13/2021   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   Raynaud disease    GERD (gastroesophageal reflux disease)    Fibromyalgia    Depression    Severe aortic stenosis    Hypertension    Connective tissue disease (HCC)    Hyperlipidemia    Asthma     PCP: bROOK hESTER np  REFERRING PROVIDER: Luciano Cutter, MD   REFERRING DIAG: R29.898 (ICD-10-CM) - Muscular deconditioning    Rationale for Evaluation and Treatment: Rehabilitation  THERAPY DIAG:  Muscle weakness (generalized)  Abnormal posture  Unsteadiness on feet  ONSET DATE: 3 years  SUBJECTIVE:                                                                                                                                                                                           SUBJECTIVE STATEMENT: Pt reports she is doing well  POOL ACCESS: plans to join [employer].   From initial evaluation: I have not been moving for the last 3 years due to multiple issues.  My back pain creeps up every once in a while, had an ablation 2 years ago, also fusion Lumbar spine in 2013. I am in the process of getting another ablation.  I have trouble standing and walking due to the back pain.  Bilateral shoulder surgeries in past that still bother me. Have lost 40 lb which has helped. Have problems with breathing.  PERTINENT HISTORY:  AS s/p TAVR, hypertension, chronic diastolic heart failure, rheumatoid arthritis on biologics, and mixed connective tissue disease, chronic shortness of breath  -Diffuse/interstitial lung disease, shortness of breath, mixed tissue connective tissue, Autoimmune disease, Fibrosis lung   PAIN:  Are you having pain? Yes: NPRS scale: current 1/10 Pain location: L glute Pain description: ache Aggravating factors: moving, walking standing Relieving factors: non movement  PRECAUTIONS: Fall Chronic SOB  RED FLAGS: None   WEIGHT BEARING RESTRICTIONS: No  FALLS:  Has patient fallen in last 6 months? No  LIVING ENVIRONMENT: Lives with: husband Lives in: House/apartment Stairs: Yes: External: 2 steps; on right going up Has following equipment at home: Single point cane  OCCUPATION: retired  PLOF: Independent  PATIENT GOALS: walk for exercise (cardiologist); going on vacation where I have to walk some  NEXT MD VISIT: as needed  OBJECTIVE:  Note: Objective measures were completed  at Evaluation unless otherwise noted.  DIAGNOSTIC FINDINGS:  N/a  PATIENT SURVEYS:  FOTO completed but did not save (??)  COGNITION: Overall cognitive status: Within  functional limits for tasks assessed      MUSCLE LENGTH:  wfl  POSTURE: increased thoracic kyphosis and weight shift right slight bilat knee valgus   LUMBAR ROM:   AROM eval  Flexion full  Extension full  Right lateral flexion 25% limited  Left lateral flexion 25% limited  Right rotation   Left rotation    (Blank rows = not tested)  LOWER EXTREMITY ROM:     wfl  LOWER EXTREMITY MMT:    MMT Right eval Left eval  Hip flexion 42.7 44.9  Hip extension    Hip abduction 31.2 36.0  Hip adduction    Hip internal rotation    Hip external rotation    Knee flexion    Knee extension 33.1 35.1  Ankle dorsiflexion    Ankle plantarflexion    Ankle inversion    Ankle eversion     (Blank rows = not tested)   FUNCTIONAL TESTS:  5 times sit to stand: 15.47 Timed up and go (TUG): 13.17 4 stage: NBOS x 10 s; unable to complete semi-tandem, tandem or SLS Berg: 32/56  12/03/23: 5x STS-= 12.31s  GAIT: Distance walked: 400 ft Assistive device utilized: None Level of assistance: Complete Independence Comments:  a ntalgic, increased stance during swing bilat  TREATMENT  Pt seen for aquatic therapy today.  Treatment took place in water 3.5-4.75 ft in depth at the [facility] pool. Temp of water was 91.  Pt entered/exited the pool via stairs independently with bilat rail.   - Walking March   - Hand float "Suitcase Carry"   - Side Stepping with Hand Floats   - Forward Backward Leg Swing  - Leg Swings Side to Side - hold wall or noodle   - Standing Calf Raise, holding wall  - Squat  - Pool noodle/  hand float pull down to thighs   - Staggered Stance Row with Kick Board   - Seated Straddle on Flotation Forward Breast Stroke Arms and Bicycle Legs   Pt requires the buoyancy and hydrostatic  pressure of water for support, and to offload joints by unweighting joint load by at least 50 % in navel deep water and by at least 75-80% in chest to neck deep water.  Viscosity of the water is needed for resistance of strengthening. Water current perturbations provides challenge to standing balance requiring increased core activation.  PATIENT EDUCATION:  Education details:aquatic program progressions.   Person educated: Patient Education method: Explanation handout Education comprehension: verbalized understanding  HOME EXERCISE PROGRAM: Aquatic Access Code: [email or web address] URL: [email or web address] This aquatic home exercise program from Med[employer] utilizes pictures from land based exercises, but has been adapted prior to lamination and issuance.  Access Code: [email or web address] URL: [email or web address] Date: 12/15/2023 Prepared by: Ailene Ards  Exercises - Walking March  - 1-3 x weekly - Hand float "Suitcase Carry"   - 1-3 x weekly - Side Stepping with Hand Floats  - 1-3 x weekly - Forward Backward Leg Swing - hold wall or noodle   - 1-3 x weekly - 1-2 sets - 10 reps - Leg Swings Side to Side - hold wall or noodle  - 1-3 x weekly - 1-2 sets - 10 reps - Standing Calf Raise, holding wall   - 1-3 x weekly - 1-2 sets - 10 reps - Squat  - 1-3 x weekly - 1-2 sets - 10 reps - Pool noodle/  hand float pull down to thighs  - 1-3 x weekly - 1-2 sets -  10 reps - Staggered Stance Row with Kick Board  - 1-3 x weekly - 1-2 sets - 10 reps - Seated Straddle on Flotation Forward Breast Stroke Arms and Bicycle Legs  - 1-3 x weekly    ASSESSMENT:  CLINICAL IMPRESSION: HEP finalized printed/laminated and issues.  She is instructed through program with instruction throughout on execution, increasing and decreasing intensity/challenge.  She requires verbal cuing and demonstration.  She completes all without increase in pain.  She will gain access to pool in next day or so  then return for final session to ensure understanding and indep with final aquatic HEP. Final testing to be completed with dc pending next session.       From Initial Evaluation: Patient is a 73 y.o. f who was seen today for physical therapy evaluation and treatment for generalized weakness. She has an extensive Pmhx which includes RA, LB surgeries, lung dysfunction and CHF.  She recently has had an exacerbation of LBP which she will be receiving an ablation.  She has had bilateral shoulder surgeries.  Her muscle weakness therefore is multifactorial.  She reports lack of exercise/mobility for the past 3 years and has a goal to increase her toleration to activity and improve strength to be able to be more active and travel. She does state that her breathing is her most limiting issue with toleration to activity.  She is a good candidate for skilled PT to improve all areas of deficit.  She will begin with aquatic only intervention to demonstrate that due to her dysfunctions she will tolerate the water medium well to meet goals.  OBJECTIVE IMPAIRMENTS: Abnormal gait, decreased activity tolerance, decreased balance, decreased endurance, decreased mobility, difficulty walking, decreased strength, impaired flexibility, postural dysfunction, obesity, and pain.   ACTIVITY LIMITATIONS: standing, stairs, and locomotion level  PARTICIPATION LIMITATIONS: cleaning, laundry, shopping, community activity, and yard work Programme researcher, broadcasting/film/video  PERSONAL FACTORS: Past/current experiences, Time since onset of injury/illness/exacerbation, and 3+ comorbidities: see PmHx  are also affecting patient's functional outcome.   REHAB POTENTIAL: Good  CLINICAL DECISION MAKING: Evolving/moderate complexity  EVALUATION COMPLEXITY: Moderate   GOALS: Goals reviewed with patient? Yes  SHORT TERM GOALS: Target date: 12/11/23  Pt will tolerate full aquatic sessions consistently without increase in pain and with improving function to  demonstrate good toleration and effectiveness of intervention.  Baseline: Goal status: Met 11/30/23  2.  Pt will tolerate walking to and from setting (566ft ea way), engaging in aquatic therapy session without limiting pain or fatigue Baseline: TBD Goal status: In process 11/30/23; Met 12/15/23    LONG TERM GOALS: Target date: 12/25/23  Pt to improve on LEFS by at least 9 points to demonstrate improved functional ability Baseline:  Goal status: INITIAL  2.  Pt will improve on Berg balance test to >/= 50/56 to demonstrate a decrease in fall risk. Baseline: 32/56 Goal status: INITIAL  3.  Pt will be indep with final aquatic and land (as approp)HEP for continued management of condition with verbal plan on consistency (gym and/or pool access vs indep at home). Baseline: none Goal status: In progress 12/08/23  4.  Pt will improve on 5 X STS test to <or= 12s  to demonstrate improving functional lower extremity strength, transitional movements, and balance Baseline: 15.47 Goal status: MET - 12/03/23  5.  Pt to acknowledge ability to tolerate/plan a vacation  Baseline: too fatigable to tolerate Goal status: INITIAL  6.  Pt will improve strength in all areas listed by at least 5  lbs to demonstrate improved overall physical function Baseline: see chart Goal status: INITIAL  PLAN:  PT FREQUENCY: 2x/week  PT DURATION: 8 weeks  PLANNED INTERVENTIONS: 97164- PT Re-evaluation, 97110-Therapeutic exercises, 97530- Therapeutic activity, 97112- Neuromuscular re-education, 97535- Self Care, 97140- Manual therapy, [phone]- Gait training, [phone]- Orthotic Fit/training, [phone]- Aquatic Therapy, [phone]- Ionotophoresis 4mg /ml Dexamethasone, Patient/Family education, Balance training, Stair training, Taping, Dry Needling, DME instructions, Cryotherapy, and Moist heat.  PLAN FOR NEXT SESSION: Aquatic only: general strengthening.Gentle ROM/stretching; balance retraining, core stability  Rushie Chestnut) Valta Dillon  MPT 12/15/23 1:54 PM [facility] [address] [place], [place], [address] Phone: [phone]   Fax:  [phone]

## 2023-12-17 ENCOUNTER — Ambulatory Visit (HOSPITAL_BASED_OUTPATIENT_CLINIC_OR_DEPARTMENT_OTHER): Payer: Medicare Other | Admitting: Physical Therapy

## 2023-12-17 ENCOUNTER — Encounter (HOSPITAL_BASED_OUTPATIENT_CLINIC_OR_DEPARTMENT_OTHER): Payer: Self-pay | Admitting: Physical Therapy

## 2023-12-17 DIAGNOSIS — R293 Abnormal posture: Secondary | ICD-10-CM

## 2023-12-17 DIAGNOSIS — M6281 Muscle weakness (generalized): Secondary | ICD-10-CM | POA: Diagnosis not present

## 2023-12-17 DIAGNOSIS — R2681 Unsteadiness on feet: Secondary | ICD-10-CM

## 2023-12-17 DIAGNOSIS — M5459 Other low back pain: Secondary | ICD-10-CM

## 2023-12-17 NOTE — Therapy (Signed)
 OUTPATIENT PHYSICAL THERAPY THORACOLUMBAR TREATMENT PHYSICAL THERAPY DISCHARGE SUMMARY  Visits from Start of Care: 10  Current functional level related to goals / functional outcomes: Indep with AD's as needed   Remaining deficits: Chronic pain   Education / Equipment: Management of chronic condition/HEP   Patient agrees to discharge. Patient goals were met. Patient is being discharged due to meeting the stated rehab goals.   Patient Name: Katherine Waters MRN: [id number] DOB:1951-07-08, 73 y.o., female Today's Date: 12/17/2023  END OF SESSION:  PT End of Session - 12/17/23 1103     Visit Number 10    Date for PT Re-Evaluation 12/25/23    Authorization Type MCR    Progress Note Due on Visit 10    PT Start Time 1100    PT Stop Time 1140    PT Time Calculation (min) 40 min    Activity Tolerance Patient tolerated treatment well    Behavior During Therapy WFL for tasks assessed/performed             Past Medical History:  Diagnosis Date   Arthritis    osteoarthritis   Asthma    Seasonal   Connective tissue disease (HCC)    CREST syndrome (HCC)    Depression    Diabetes 1.5, managed as type 2 (HCC)    Dyspnea    Fibromyalgia    GERD (gastroesophageal reflux disease)    History of migraine headaches    Horseshoe kidney    Hyperlipidemia    Hypertension    Inflammatory arthritis    PONV (postoperative nausea and vomiting)    Raynaud disease    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 Excell Seltzer and Dr. Cornelius Moras    Severe aortic stenosis    Sleep apnea    Mild, CPAP wore for one year   Past Surgical History:  Procedure Laterality Date   ABDOMINAL HYSTERECTOMY     BREAST BIOPSY Right    CARDIAC CATHETERIZATION     CATARACT EXTRACTION, BILATERAL     COLONOSCOPY WITH ESOPHAGOGASTRODUODENOSCOPY (EGD) AND ESOPHAGEAL DILATION (ED)     2016   INTRAOPERATIVE TRANSTHORACIC  ECHOCARDIOGRAM Left 02/12/2021   Procedure: INTRAOPERATIVE TRANSTHORACIC ECHOCARDIOGRAM;  Surgeon: Tonny Bollman, MD;  Location: [facility] OR;  Service: Open Heart Surgery;  Laterality: Left;   LAPAROSCOPIC BILATERAL SALPINGO OOPHERECTOMY Bilateral 04/02/2022   Procedure: LAPAROSCOPIC BILATERAL SALPINGO OOPHORECTOMY;  Surgeon: Gerald Leitz, MD;  Location: [facility] OR;  Service: Gynecology;  Laterality: Bilateral;   LUMBAR FUSION  2013   POSTERIOR TIBIAL TENDON REPAIR Left 2019   RIGHT/LEFT HEART CATH AND CORONARY ANGIOGRAPHY N/A 01/29/2021   Procedure: RIGHT/LEFT HEART CATH AND CORONARY ANGIOGRAPHY;  Surgeon: Lyn Records, MD;  Location: [facility] IN[facility];  Service: Cardiovascular;  Laterality: N/A;   ROTATOR CUFF REPAIR Right    SHOULDER ARTHROSCOPY WITH DISTAL CLAVICLE RESECTION Left 12/09/2022   Procedure: SHOULDER ARTHROSCOPY WITH DISTAL CLAVICLE RESECTION AND SUBACROMIAL DECOMPRESSION,LABRAL DEBRIDEMENT;  Surgeon: Francena Hanly, MD;  Location: [facility] ORS;  Service: Orthopedics;  Laterality: Left;  exparel block 120   SHOULDER ARTHROSCOPY WITH ROTATOR CUFF REPAIR Left 12/09/2022   Procedure: SHOULDER ARTHROSCOPY WITH DERIDEMENT AND  ROTATOR CUFF REPAIR;  Surgeon: Francena Hanly, MD;  Location: [facility] ORS;  Service: Orthopedics;  Laterality: Left;  exparel block 120   TRANSCATHETER AORTIC VALVE REPLACEMENT, TRANSFEMORAL N/A 02/12/2021   Procedure: TRANSCATHETER AORTIC VALVE REPLACEMENT, TRANSFEMORAL;  Surgeon: Excell Seltzer,  Casimiro Needle, MD;  Location: [facility] OR;  Service: Open Heart Surgery;  Laterality: N/A;   TUBAL LIGATION     ULTRASOUND GUIDANCE FOR VASCULAR ACCESS Bilateral 02/12/2021   Procedure: ULTRASOUND GUIDANCE FOR VASCULAR ACCESS;  Surgeon: Tonny Bollman, MD;  Location: [facility] OR;  Service: Open Heart Surgery;  Laterality: Bilateral;   WISDOM TOOTH EXTRACTION  1973   Patient Active Problem List   Diagnosis Date Noted   Ovarian mass, left 04/02/2022   Transient complete heart block (HCC) 02/13/2021   S/P TAVR  (transcatheter aortic valve replacement) 02/12/2021   Raynaud disease    GERD (gastroesophageal reflux disease)    Fibromyalgia    Depression    Severe aortic stenosis    Hypertension    Connective tissue disease (HCC)    Hyperlipidemia    Asthma     PCP: bROOK hESTER np  REFERRING PROVIDER: Luciano Cutter, MD   REFERRING DIAG: R29.898 (ICD-10-CM) - Muscular deconditioning   Rationale for Evaluation and Treatment: Rehabilitation  THERAPY DIAG:  Muscle weakness (generalized)  Abnormal posture  Unsteadiness on feet  Other low back pain  ONSET DATE: 3 years  SUBJECTIVE:                                                                                                                                                                                           SUBJECTIVE STATEMENT: Joined [employer]  POOL ACCESS: plans to join [employer].   From initial evaluation: I have not been moving for the last 3 years due to multiple issues.  My back pain creeps up every once in a while, had an ablation 2 years ago, also fusion Lumbar spine in 2013. I am in the process of getting another ablation.  I have trouble standing and walking due to the back pain.  Bilateral shoulder surgeries in past that still bother me. Have lost 40 lb which has helped. Have problems with breathing.  PERTINENT HISTORY:  AS s/p TAVR, hypertension, chronic diastolic heart failure, rheumatoid arthritis on biologics, and mixed connective tissue disease, chronic shortness of breath  -Diffuse/interstitial lung disease, shortness of breath, mixed tissue connective tissue, Autoimmune disease, Fibrosis lung   PAIN:  Are you having pain? Yes: NPRS scale: current 1/10 Pain location: L glute Pain description: ache Aggravating factors: moving, walking standing Relieving factors: non movement  PRECAUTIONS: Fall Chronic SOB  RED FLAGS: None   WEIGHT BEARING RESTRICTIONS: No  FALLS:  Has patient fallen in last 6  months? No  LIVING ENVIRONMENT: Lives with: husband Lives in: House/apartment Stairs: Yes: External: 2 steps; on right going up Has following equipment at home: Single point  cane  OCCUPATION: retired  PLOF: Independent  PATIENT GOALS: walk for exercise (cardiologist); going on vacation where I have to walk some  NEXT MD VISIT: as needed  OBJECTIVE:  Note: Objective measures were completed at Evaluation unless otherwise noted.  DIAGNOSTIC FINDINGS:  N/a  PATIENT SURVEYS:  FOTO completed but did not save (??)  COGNITION: Overall cognitive status: Within functional limits for tasks assessed      MUSCLE LENGTH:  wfl  POSTURE: increased thoracic kyphosis and weight shift right slight bilat knee valgus   LUMBAR ROM:   AROM eval 12/17/23  Flexion full   Extension full   Right lateral flexion 25% limited full  Left lateral flexion 25% limited full  Right rotation    Left rotation     (Blank rows = not tested)  LOWER EXTREMITY ROM:     wfl  LOWER EXTREMITY MMT:    MMT Right eval Left eval R / L 12/17/23  Hip flexion 42.7 44.9 49.8  / 51.3  Hip extension     Hip abduction 31.2 36.0 37.3 / 35.5  Hip adduction     Hip internal rotation     Hip external rotation     Knee flexion     Knee extension 33.1 35.1   Ankle dorsiflexion     Ankle plantarflexion     Ankle inversion     Ankle eversion      (Blank rows = not tested)   FUNCTIONAL TESTS:  5 times sit to stand: 15.47 Timed up and go (TUG): 13.17 4 stage: NBOS x 10 s; unable to complete semi-tandem, tandem or SLS Berg: 32/56  12/03/23: 5x STS-= 12.31s      Sharlene Motts 46/56 GAIT: Distance walked: 400 ft Assistive device utilized: None Level of assistance: Complete Independence Comments:  a ntalgic, increased stance during swing bilat  TREATMENT  Self care: instruction on getting out in front of chronic pain before intensifying when on vacation; Frequency and duration of aquatic HEP.  Pt seen for  aquatic therapy today.  Treatment took place in water 3.5-4.75 ft in depth at the [facility] pool. Temp of water was 91.  Pt entered/exited the pool via stairs independently with bilat rail.   - Walking March   - Hand float "Suitcase Carry"   - Side Stepping with Hand Floats   - Forward Backward Leg Swing  - Leg Swings Side to Side - hold wall or noodle   - Standing Calf Raise, holding wall  - Squat  - Pool noodle/  hand float pull down to thighs   - Staggered Stance Row with Kick Board   - Seated Straddle on Flotation Forward Breast Stroke Arms and Bicycle Legs   Pt requires the buoyancy and hydrostatic pressure of water for support, and to offload joints by unweighting joint load by at least 50 % in navel deep water and by at least 75-80% in chest to neck deep water.  Viscosity of the water is needed for resistance of strengthening. Water current perturbations provides challenge to standing balance requiring increased core activation.  PATIENT EDUCATION:  Education details:aquatic program progressions.   Person educated: Patient Education method: Explanation handout Education comprehension: verbalized understanding  HOME EXERCISE PROGRAM: Aquatic Access Code: [email or web address] URL: [email or web address] This aquatic home exercise program from MedBridge utilizes pictures from land based exercises, but has been adapted prior to lamination and issuance.  Access Code: [email or web address] URL: [email or web address] Date: 12/15/2023 Prepared by: Ailene Ards  Exercises - Walking March  -  1-3 x weekly - Hand float "Suitcase Carry"   - 1-3 x weekly - Side Stepping with Hand Floats  - 1-3 x weekly - Forward Backward Leg Swing - hold wall or noodle   - 1-3 x weekly - 1-2 sets - 10 reps - Leg Swings Side to Side - hold wall or noodle  - 1-3 x weekly - 1-2 sets - 10 reps - Standing Calf Raise, holding wall   - 1-3 x weekly - 1-2 sets - 10 reps - Squat  - 1-3 x  weekly - 1-2 sets - 10 reps - Pool noodle/  hand float pull down to thighs  - 1-3 x weekly - 1-2 sets - 10 reps - Staggered Stance Row with Kick Board  - 1-3 x weekly - 1-2 sets - 10 reps - Seated Straddle on Flotation Forward Breast Stroke Arms and Bicycle Legs  - 1-3 x weekly    ASSESSMENT:  CLINICAL IMPRESSION: Pt reports trip planned for next month meeting LTG.  She is looking forward.  She completes issued HEP throughout session today given verbal and written clarifications throughout. DC testing completed.  She has joined [employer] and has access to pool.  She has reached her max potential.  All goals met. Pt dc     From Initial Evaluation: Patient is a 73 y.o. f who was seen today for physical therapy evaluation and treatment for generalized weakness. She has an extensive Pmhx which includes RA, LB surgeries, lung dysfunction and CHF.  She recently has had an exacerbation of LBP which she will be receiving an ablation.  She has had bilateral shoulder surgeries.  Her muscle weakness therefore is multifactorial.  She reports lack of exercise/mobility for the past 3 years and has a goal to increase her toleration to activity and improve strength to be able to be more active and travel. She does state that her breathing is her most limiting issue with toleration to activity.  She is a good candidate for skilled PT to improve all areas of deficit.  She will begin with aquatic only intervention to demonstrate that due to her dysfunctions she will tolerate the water medium well to meet goals.  OBJECTIVE IMPAIRMENTS: Abnormal gait, decreased activity tolerance, decreased balance, decreased endurance, decreased mobility, difficulty walking, decreased strength, impaired flexibility, postural dysfunction, obesity, and pain.   ACTIVITY LIMITATIONS: standing, stairs, and locomotion level  PARTICIPATION LIMITATIONS: cleaning, laundry, shopping, community activity, and yard work Programme researcher, broadcasting/film/video  PERSONAL  FACTORS: Past/current experiences, Time since onset of injury/illness/exacerbation, and 3+ comorbidities: see PmHx  are also affecting patient's functional outcome.   REHAB POTENTIAL: Good  CLINICAL DECISION MAKING: Evolving/moderate complexity  EVALUATION COMPLEXITY: Moderate   GOALS: Goals reviewed with patient? Yes  SHORT TERM GOALS: Target date: 12/11/23  Pt will tolerate full aquatic sessions consistently without increase in pain and with improving function to demonstrate good toleration and effectiveness of intervention.  Baseline: Goal status: Met 11/30/23  2.  Pt will tolerate walking to and from setting (556ft ea way), engaging in aquatic therapy session without limiting pain or fatigue Baseline: TBD Goal status: In process 11/30/23; Met 12/15/23    LONG TERM GOALS: Target date: 12/25/23  Pt to improve on LEFS by at least 9 points to demonstrate improved functional ability Baseline:  Goal status: IN ERROR. NOT COMPLETE.  FOTO COMPLETED BUT DID NOT SAVE  2.  Pt will improve on Berg balance test to >/= 50/56 to demonstrate a decrease in fall risk. Baseline: 32/56  Goal status: Met 46/56 12/17/23  3.  Pt will be indep with final aquatic and land (as approp)HEP for continued management of condition with verbal plan on consistency (gym and/or pool access vs indep at home). Baseline: none Goal status: In progress 12/08/23; Met 12/17/23  4.  Pt will improve on 5 X STS test to <or= 12s  to demonstrate improving functional lower extremity strength, transitional movements, and balance Baseline: 15.47 Goal status: MET - 12/03/23  5.  Pt to acknowledge ability to tolerate/plan a vacation  Baseline: too fatigable to tolerate Goal status: Met 12/17/23  6.  Pt will improve strength in all areas listed by at least 5 lbs to demonstrate improved overall physical function Baseline: see chart Goal status: exceeded 12/17/23  PLAN:  PT FREQUENCY: 2x/week  PT DURATION: 8 weeks  PLANNED  INTERVENTIONS: 97164- PT Re-evaluation, 97110-Therapeutic exercises, 97530- Therapeutic activity, 97112- Neuromuscular re-education, 97535- Self Care, 40981- Manual therapy, [phone]- Gait training, [phone]- Orthotic Fit/training, [phone]- Aquatic Therapy, [phone]- Ionotophoresis 4mg /ml Dexamethasone, Patient/Family education, Balance training, Stair training, Taping, Dry Needling, DME instructions, Cryotherapy, and Moist heat.  PLAN FOR NEXT SESSION: DC  Rushie Chestnut) Kylei Purington MPT 12/17/23 11:08 AM [facility] [address] [place], [place], [address] Phone: [phone]   Fax:  [phone]

## 2023-12-29 NOTE — Progress Notes (Unsigned)
 Cardiology Office Note:  .   Date:  12/31/2023  ID:  Ian Bushman, DOB 06/04/1951, MRN [id number] PCP: Carilyn Goodpasture, NP  [facility] Providers Cardiologist:  Reatha Harps, MD { History of Present Illness: .    Chief Complaint  Patient presents with   Follow-up    6 months.    Jala Balke is a 73 y.o. female with history of obesity, AS s/p TAVR, HTN, HLD, HFpEF, pHTN, calcific MS who presents for follow-up.    History of Present Illness   The patient is a 73 year old with aortic stenosis status post TAVR, obesity, hypertension, HFpEF, and calcific mitral stenosis who presents for follow-up.  She continues to experience shortness of breath, which has not significantly improved despite weight loss. Advair has helped reduce early morning coughing and phlegm but has not alleviated the shortness of breath.  She reports a significant weight loss of about 40 pounds since the last appointment, attributing this to the use of Ozempic. She is working on losing an additional 20 pounds and has set a goal weight between 150 and 180 pounds. The weight loss was initially rapid due to difficulty eating, but she has since plateaued.  She has been engaging in water therapy and exercises at [facility] three times a week, which includes modified exercises for arthritis. Her sessions last between 30 to 45 minutes. She reports some improvement in her ability to climb stairs and balance, although she still experiences significant pain due to arthritis, particularly in her hips and back. She uses a rollator for stability due to poor balance and has not experienced any falls.  She is currently on several medications including Aldactone 25 mg daily for HFpEF, Micardis 80 mg daily for hypertension, Jardiance 10 mg daily, and amlodipine 10 mg daily. She also takes antibiotics before dental work and aspirin for her TAVR valve.          Problem List 1. Aortic stenosis s/p TAVR -LHC: normal coronaries  01/29/2021 -29 mm Evolut Pro 02/12/2021 2. HTN 3. HLD -T chol 161, HDL 45, LDL 87, TG 172 4. OSA, mild 5. COPD, mild 6. 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 Atrial tachycardia  8. HFpEF 9. Pulmonary hypertension -moderate mPAP 35 mmHG PCWP 22 mmHG -2/2 diastolic CHF 10.  Moderate calcific mitral valve stenosis -MG 8 mmHG 11. Obesity -BMI 38    ROS: All other ROS reviewed and negative. Pertinent positives noted in the HPI.     Studies Reviewed: Marland Kitchen        TTE 02/10/2023  1. Left ventricular ejection fraction, by estimation, is 60 to 65%. The  left ventricle has normal function. The left ventricle has no regional  wall motion abnormalities. There is mild left ventricular hypertrophy.  Left ventricular diastolic parameters  are indeterminate.   2. Right ventricular systolic function is normal. The right ventricular  size is normal.   3. Left atrial size was moderately dilated.   4. Incomplete evaluation no MVA/mean gradient likely mild MS due to  severe MAC . The mitral valve is abnormal. Trivial mitral valve  regurgitation. No evidence of mitral stenosis. Severe mitral annular  calcification.   5. 29 mm Medtronic evolut valve appears to be mild AR ? PVL near base of  the anterior mitral leaflet although images not adequate to tell if truly  PVL Review of TTE done 12/19/21 also showed leak ( seen on doppler as well )  but reader did not comment due  to  merging of signal with mitral inflow . The aortic valve has been  repaired/replaced. Aortic valve regurgitation is not visualized. No aortic  stenosis is present.   6. The inferior vena cava is normal in size with greater than 50%  respiratory variability, suggesting right atrial pressure of 3 mmHg.  Physical Exam:   VS:  BP (!) 114/58 (BP Location: Left Arm, Patient Position: Sitting, Cuff Size: Normal)   Pulse 78   Ht 5\' 2"  (1.575 m)   Wt 208 lb (94.3 kg)   BMI 38.04 kg/m    Wt Readings from Last 3 Encounters:   12/31/23 208 lb (94.3 kg)  09/23/23 213 lb 6.4 oz (96.8 kg)  07/03/23 220 lb (99.8 kg)    GEN: Well nourished, well developed in no acute distress NECK: No JVD; No carotid bruits CARDIAC: RRR, 2/6 SEM, rubs, gallops RESPIRATORY:  Clear to auscultation without rales, wheezing or rhonchi  ABDOMEN: Soft, non-tender, non-distended EXTREMITIES:  No edema; No deformity  ASSESSMENT AND PLAN: .   Assessment and Plan    Mitral Stenosis, moderate Calcific mitral stenosis managed with diuretics. Echocardiogram planned to reassess severity. Open heart surgery not currently necessary. Catheter-based procedure considered if significant valve worsening occurs, though eligibility is limited. - Order echocardiogram to reassess mitral valve stenosis. - Continue current medications. - Follow up in six months.  Aortic Stenosis Status Post TAVR Aortic stenosis with TAVR and known PVL, anticipated to remain stable. Requires antibiotics before dental procedures and ongoing aspirin therapy. - Continue antibiotics before dental work. - Continue aspirin therapy. - repeat echo as above   Heart Failure with Preserved Ejection Fraction (HFpEF) Heart failure with preserved ejection fraction. Symptomatic improvement with weight loss and increased activity, though shortness of breath persists. Managed with medications, no volume overload present. - Continue Aldactone 25 mg daily. - Continue Micardis 80 mg daily. - Continue Jardiance 10 mg daily. - Continue amlodipine 10 mg daily.  Pulmonary Hypertension Pulmonary hypertension likely secondary to sleep apnea and diastolic heart failure. Continues follow-up with pulmonologist for mild COPD. - Continue follow-up with pulmonologist for COPD.  Hypertension Hypertension well-controlled with current medication regimen. Blood pressure readings are stable. - Continue Micardis 80 mg daily. - Continue amlodipine 10 mg daily.  Obesity Morbid obesity with successful  40-pound weight loss through Ozempic and lifestyle changes. Encouraged to continue weight loss efforts, targeting a goal weight of 150-180 pounds. - Continue Ozempic for weight management. - Encourage continued weight loss efforts.              Follow-up: Return in about 6 months (around 07/02/2024).   Signed, Lenna Gilford. Flora Lipps, MD, [facility]  [facility]  [address], [address] [place], [place] [address] [phone]  11:07 AM

## 2023-12-31 ENCOUNTER — Ambulatory Visit: Payer: Medicare Other | Attending: Cardiovascular Disease | Admitting: Cardiovascular Disease

## 2023-12-31 ENCOUNTER — Encounter: Payer: Self-pay | Admitting: Cardiovascular Disease

## 2023-12-31 VITALS — BP 114/58 | HR 78 | Ht 62.0 in | Wt 208.0 lb

## 2023-12-31 DIAGNOSIS — Z952 Presence of prosthetic heart valve: Secondary | ICD-10-CM | POA: Insufficient documentation

## 2023-12-31 DIAGNOSIS — I272 Pulmonary hypertension, unspecified: Secondary | ICD-10-CM | POA: Diagnosis present

## 2023-12-31 DIAGNOSIS — I10 Essential (primary) hypertension: Secondary | ICD-10-CM | POA: Diagnosis present

## 2023-12-31 DIAGNOSIS — R0602 Shortness of breath: Secondary | ICD-10-CM | POA: Insufficient documentation

## 2023-12-31 DIAGNOSIS — I5032 Chronic diastolic (congestive) heart failure: Secondary | ICD-10-CM | POA: Insufficient documentation

## 2023-12-31 DIAGNOSIS — I342 Nonrheumatic mitral (valve) stenosis: Secondary | ICD-10-CM | POA: Insufficient documentation

## 2023-12-31 NOTE — Patient Instructions (Signed)
 Medication Instructions:   Your physician recommends that you continue on your current medications as directed. Please refer to the Current Medication list given to you today.   *If you need a refill on your cardiac medications before your next appointment, please call your pharmacy*   Lab Work: None    If you have labs (blood work) drawn today and your tests are completely normal, you will receive your results only by: MyChart Message (if you have MyChart) OR A paper copy in the mail If you have any lab test that is abnormal or we need to change your treatment, we will call you to review the results.   Testing/Procedures: .Echo will be scheduled at [address].  Your physician has requested that you have an echocardiogram. Echocardiography is a painless test that uses sound waves to create images of your heart. It provides your doctor with information about the size and shape of your heart and how well your heart's chambers and valves are working. This procedure takes approximately one hour. There are no restrictions for this procedure. Please do NOT wear cologne, perfume, aftershave, or lotions (deodorant is allowed). Please arrive 15 minutes prior to your appointment time.    Follow-Up: At [facility], you and your health needs are our priority.  As part of our continuing mission to provide you with exceptional heart care, we have created designated Provider Care Teams.  These Care Teams include your primary Cardiologist (physician) and Advanced Practice Providers (APPs -  Physician Assistants and Nurse Practitioners) who all work together to provide you with the care you need, when you need it.  We recommend signing up for the patient portal called "MyChart".  Sign up information is provided on this After Visit Summary.  MyChart is used to connect with patients for Virtual Visits (Telemedicine).  Patients are able to view lab/test results, encounter notes, upcoming  appointments, etc.  Non-urgent messages can be sent to your provider as well.   To learn more about what you can do with MyChart, go to [email or web address].    Your next appointment:   6 month(s)  The format for your next appointment:   In Person  Provider:   Reatha Harps, MD    Other Instructions

## 2024-01-05 ENCOUNTER — Encounter (HOSPITAL_BASED_OUTPATIENT_CLINIC_OR_DEPARTMENT_OTHER): Payer: Self-pay | Admitting: Pulmonary Disease

## 2024-01-05 ENCOUNTER — Other Ambulatory Visit (HOSPITAL_BASED_OUTPATIENT_CLINIC_OR_DEPARTMENT_OTHER): Payer: Self-pay

## 2024-01-05 ENCOUNTER — Ambulatory Visit (HOSPITAL_BASED_OUTPATIENT_CLINIC_OR_DEPARTMENT_OTHER): Payer: Medicare Other | Admitting: Pulmonary Disease

## 2024-01-05 VITALS — BP 120/72 | HR 82 | Ht 62.0 in | Wt 206.7 lb

## 2024-01-05 DIAGNOSIS — Z87891 Personal history of nicotine dependence: Secondary | ICD-10-CM | POA: Diagnosis not present

## 2024-01-05 DIAGNOSIS — J4530 Mild persistent asthma, uncomplicated: Secondary | ICD-10-CM | POA: Diagnosis not present

## 2024-01-05 DIAGNOSIS — R5381 Other malaise: Secondary | ICD-10-CM

## 2024-01-05 DIAGNOSIS — R0602 Shortness of breath: Secondary | ICD-10-CM

## 2024-01-05 DIAGNOSIS — J479 Bronchiectasis, uncomplicated: Secondary | ICD-10-CM | POA: Diagnosis not present

## 2024-01-05 MED ORDER — ADVAIR HFA 115-21 MCG/ACT IN AERO: 2.0000 | INHALATION_SPRAY | Freq: Two times a day (BID) | RESPIRATORY_TRACT | 5 refills | Status: DC

## 2024-01-05 MED ORDER — OPTICHAMBER DIAMOND-LG MASK DEVI
0 refills | Status: AC
  Filled 2024-01-05: qty 1, 30d supply, fill #0

## 2024-01-05 MED ORDER — OPTICHAMBER DIAMOND-LG MASK DEVI: 0 refills | Status: DC

## 2024-01-05 NOTE — Patient Instructions (Signed)
 Chronic Shortness of Breath Mild bronchiectasis -CONTINUE Advair 115-21 mcg TWO puffs in the morning and evening. Use with spacer. Rinse mouth out after use -Continue regular aerobic exercise.  -Encourage ongoing weight loss -Great job with your progress!   Mixed Connective Tissue Disease -CT 10/2023 with no evidence of fibrosis   Physical Deconditioning Limited exercise due to arthritic pain and shortness of breath. -Continue regular aerobic exercise

## 2024-01-05 NOTE — Progress Notes (Unsigned)
 Subjective:   PATIENT ID: Katherine Waters GENDER: female DOB: 1950-11-30, MRN: [id number]  Chief Complaint  Patient presents with   Follow-up    Autoimmune disease    Reason for Visit: New consult for shortness of breath  This note is generated using Abridge programming. Patient/family has given consent.  HPI  [name] is a a 73 year old with a history of AS s/p TAVR, hypertension, chronic diastolic heart failure, rheumatoid arthritis on biologics, and mixed connective tissue disease, presents with persistent shortness of breath. The patient was referred by her cardiologist for further evaluation with pulmonology.   The patient has been experiencing shortness of breath for several years, which has not improved despite weight loss and aortic valve replacement (TAVR) in April 2022. The shortness of breath is primarily experienced during physical activities such as walking and climbing stairs, and is not associated with wheezing or coughing. The patient has a history of asthma since childhood, but has not been on inhalers for over three years as she did not find them effective.   The patient also reports arthritic pain, which along with the shortness of breath, has limited her ability to exercise. She participated in cardiac rehabilitation a year after her TAVR procedure, but did not find it helpful for her breathing. The patient has not had any respiratory illnesses for several years.   01/05/24 Since our last visit she was started on Advair HFA which she waited to start until December. She takes as directed except for the evening dose in the evenings. Does cause some hoarseness. Her cough and sputum production has improved some but her shortness of breath is unchanged. Does not use albuterol due to side effects. She completed water therapy and now going to the pool three times a week at [facility] [facility]. Has lost weight with 40 lb weight loss with Ozempic. 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 Excell Seltzer and Dr. Cornelius Moras    Severe aortic stenosis    Sleep apnea    Mild, CPAP wore for one year     Family History  Problem Relation Age of Onset   COPD Mother    Heart attack Father 49   Cancer Sister    Cirrhosis Brother    Heart disease Brother      Social History   Occupational History   Occupation: retired Catering manager  Tobacco Use   Smoking status: Former    Current packs/day: 0.00    Average packs/day: 0.5 packs/day for 6.0 years (3.0 ttl pk-yrs)    Types: Cigarettes    Start date: 42    Quit date: 1976    Years since quitting: 49.2    Passive exposure:  Past   Smokeless tobacco: Never  Vaping Use   Vaping status: Never Used  Substance and Sexual Activity   Alcohol use: Yes    Alcohol/week: 1.0 standard drink of alcohol    Types: 1 Shots of liquor per week    Comment: occ   Drug use: Not Currently   Sexual activity: Not on file    Allergies  Allergen Reactions   Dobutamine     Decreases heart rate    Nsaids     Elevated kidney levels recommended to avoid NSAIDS   Latex Rash   Sulfa Antibiotics Rash   Tape Rash    Adhesive/Band-aid     Outpatient Medications Prior to Visit  Medication Sig Dispense Refill   acetaminophen (TYLENOL) 500 MG tablet Take 1,000 mg by mouth  daily.  may take an additional dose (1000 mg) as needed for pain     albuterol (VENTOLIN HFA) 108 (90 Base) MCG/ACT inhaler Inhale 2 puffs into the lungs every 6 (six) hours as needed. 18 g 2   amLODipine (NORVASC) 10 MG tablet Take 10 mg by mouth in the morning.     amoxicillin (AMOXIL) 500 MG tablet Take 4 tablets (2,000 mg total) one hour prior to all dental visits. 12 tablet 6   aspirin 81 MG chewable tablet Chew 1 tablet (81 mg total) by mouth daily.     DULoxetine (CYMBALTA) 60 MG capsule Take 60 mg by mouth in the morning.     indapamide (LOZOL) 2.5 MG tablet TAKE 2 TABLETS (5 MG TOTAL) BY MOUTH IN THE MORNING. 180 tablet 2   JARDIANCE 10 MG TABS tablet TAKE 1 TABLET BY MOUTH DAILY BEFORE BREAKFAST 30 tablet 11   loratadine (CLARITIN) 10 MG tablet Take 10 mg by mouth daily.     omeprazole (PRILOSEC) 40 MG capsule Take 40 mg by mouth every morning.     PREBIOTIC PRODUCT PO Take 1 tablet by mouth daily.     Semaglutide (OZEMPIC, 1 MG/DOSE, [place]) Inject 2 mg into the skin once a week.     spironolactone (ALDACTONE) 25 MG tablet TAKE 1 TABLET (25 MG TOTAL) BY MOUTH DAILY. 90 tablet 2   telmisartan (MICARDIS) 80 MG tablet Take 80 mg by mouth in the morning.     Tocilizumab (ACTEMRA) 162 MG/0.9ML SOSY Inject 0.9 mLs (162 mg total) as directed every 30 (thirty) days. 3.6 mL 0   ADVAIR HFA 115-21 MCG/ACT inhaler Inhale 2 puffs into the lungs 2 (two) times daily. 12 each 5   No facility-administered medications prior to visit.    Review of Systems  Constitutional:  Negative for chills, diaphoresis, fever, malaise/fatigue and weight loss.  HENT:  Negative for congestion.   Respiratory:  Positive for shortness of breath. Negative for cough, hemoptysis, sputum production and wheezing.   Cardiovascular:  Negative for chest pain, palpitations and leg swelling.     Objective:   Vitals:   01/05/24 0954  BP: 120/72  Pulse: 82  SpO2: 96%  Weight: 206 lb 11 oz (93.8 kg)  Height: 5\' 2"  (1.575 m)    SpO2: 96 %  Body mass index is 37.8 kg/m.  Physical Exam: General: Well-appearing, no acute distress HENT: [place], AT Eyes: EOMI, no scleral icterus Respiratory: Clear to auscultation bilaterally.  No crackles, wheezing or rales Cardiovascular: RRR, -M/R/G, no JVD Extremities:-Edema,-tenderness Neuro: AAO x4, CNII-XII grossly intact Psych: Normal mood, normal affect  Data Reviewed:  Imaging: CXR 02/08/21 - Basilar atelectasis CT Chest 10/22/23 - No****  PFT: 07/01/23 FVC 2.49 (91%) FEV1 1.99 (96%) Ratio 78  TLC 128%  RV 172% DLCO 73%. Partial bronchodilator response however not significant. Interpretation: Normal spirometry. Hyperinflation with air trapping present but unclear significance in absence of obstruction. Mildly reduced DLCO  Labs:    Latest Ref Rng & Units 12/09/2022    3:45 PM 04/02/2022   11:51 AM 02/13/2021    1:48 AM  CBC  WBC 4.0 - 10.5 K/uL 8.9  7.7  9.0   Hemoglobin 12.0 - 15.0 g/dL 84.6  96.2  95.2   Hematocrit 36.0 - 46.0 % 45.9  47.0  36.2   Platelets 150 - 400 K/uL 264  260  201       Latest Ref Rng & Units 12/09/2022    3:45 PM 04/14/2022    9:50 AM 04/02/2022   11:51 AM  CMP  Glucose 70 - 99 mg/dL 841  324  401   BUN 8 - 23 mg/dL 21  26  22    Creatinine 0.44 - 1.00 mg/dL 0.27  2.53  6.64   Sodium 135 - 145 mmol/L 135  141  136   Potassium 3.5 - 5.1 mmol/L 3.7  5.0  3.9   Chloride 98 - 111 mmol/L 97  100  101   CO2 22 - 32 mmol/L 24  27  23    Calcium 8.9 - 10.3 mg/dL 9.5  40.3  9.9        Assessment & Plan:   Discussion: 73 year old female with AS s/p TAVR, HTN, OSA, RA on biologics, secondary pulmonary hypertension, morbid obesity who presents for evaluation for shortness of breath. Reviewed PFTs with no obstructive defect but air trapping and mildly reduced DLCO  Chronic Shortness of Breath Mild bronchiectasis Persistent dyspnea despite TAVR procedure in 2022. No improvement with cardiac rehab. No associated wheezing or coughing. PFTs show  hyperinflation and air trapping suggestive of underlying asthma. -CONTINUE Advair 115-21 mcg TWO puffs in the morning and evening. Use with spacer. Rinse mouth out after use -Continue regular aerobic exercise.  -Encourage ongoing weight loss -Great job with your progress!   Mixed Connective Tissue Disease -CT 10/2023 with no evidence of fibrosis   Physical Deconditioning Limited exercise due to arthritic pain and shortness of breath. -Continue regular aerobic exercise   Health Maintenance Immunization History  Administered Date(s) Administered   Influenza-Unspecified 09/07/2023   CT Lung Screen - not qualified  No orders of the defined types were placed in this encounter.  Meds ordered this encounter  Medications   DISCONTD: Spacer/Aero-Holding Chambers (OPTICHAMBER DIAMOND-LG MASK) DEVI    Sig: Use with inhaler    Dispense:  1 each    Refill:  0   Spacer/Aero-Holding Chambers (OPTICHAMBER DIAMOND-LG MASK) DEVI    Sig: Use with inhaler    Dispense:  1 each    Refill:  0   ADVAIR HFA 115-21 MCG/ACT inhaler    Sig: Inhale 2 puffs into the lungs 2 (two) times daily.    Dispense:  12 g    Refill:  5    Brand name    Return in about 6 months (around 07/07/2024).  I have spent a total time of 30-minutes on the day of the appointment including chart review, data review, collecting history, coordinating care and discussing medical diagnosis and plan with the patient/family. Past medical history, allergies, medications were reviewed. Pertinent imaging, labs and tests included in this note have been reviewed and interpreted independently by me.  Cerrone Debold  Mechele Collin, MD [facility] 01/05/2024 10:40 AM

## 2024-01-05 NOTE — Progress Notes (Unsigned)
5

## 2024-02-03 ENCOUNTER — Ambulatory Visit (HOSPITAL_COMMUNITY)
Admission: RE | Admit: 2024-02-03 | Discharge: 2024-02-03 | Disposition: A | Discharge status: Home / Self Care | Source: Ambulatory Visit | Attending: Cardiovascular Disease | Admitting: Cardiovascular Disease

## 2024-02-03 DIAGNOSIS — I342 Nonrheumatic mitral (valve) stenosis: Secondary | ICD-10-CM | POA: Diagnosis present

## 2024-02-03 LAB — ECHOCARDIOGRAM COMPLETE
AR max vel: 1.09 cm2
AV Area VTI: 1.17 cm2
AV Area mean vel: 1.12 cm2
AV Mean grad: 8 mmHg
AV Peak grad: 16.2 mmHg
Ao pk vel: 2.01 m/s
Area-P 1/2: 1.98 cm2
MV M vel: 0.91 m/s
MV Peak grad: 3.3 mmHg
MV VTI: 0.78 cm2
P 1/2 time: 881 ms
S' Lateral: 2.86 cm

## 2024-02-04 ENCOUNTER — Encounter: Payer: Self-pay | Admitting: Cardiovascular Disease

## 2024-04-03 ENCOUNTER — Other Ambulatory Visit: Payer: Self-pay | Admitting: Cardiovascular Disease

## 2024-05-12 ENCOUNTER — Other Ambulatory Visit: Payer: Self-pay | Admitting: Cardiovascular Disease

## 2024-05-24 ENCOUNTER — Telehealth (HOSPITAL_BASED_OUTPATIENT_CLINIC_OR_DEPARTMENT_OTHER): Payer: Self-pay | Admitting: Pulmonary Disease

## 2024-05-24 NOTE — Telephone Encounter (Signed)
 Patient is scheduled 9/16 @ 11:15 with Dr Kassie. Provider has had a change in schedule and is requesting he come in at 10:30 instead. When patient calls, please confirm this switch and send CRM to [facility] to reschedule as the appointment is being blocked for her.

## 2024-06-30 ENCOUNTER — Encounter: Payer: Self-pay | Admitting: Cardiovascular Disease

## 2024-07-05 ENCOUNTER — Ambulatory Visit (HOSPITAL_BASED_OUTPATIENT_CLINIC_OR_DEPARTMENT_OTHER): Admitting: Pulmonary Disease

## 2024-07-05 ENCOUNTER — Encounter (HOSPITAL_BASED_OUTPATIENT_CLINIC_OR_DEPARTMENT_OTHER): Payer: Self-pay | Admitting: Pulmonary Disease

## 2024-07-05 VITALS — BP 128/59 | HR 79 | Ht 62.0 in | Wt 194.8 lb

## 2024-07-05 DIAGNOSIS — J479 Bronchiectasis, uncomplicated: Secondary | ICD-10-CM

## 2024-07-05 DIAGNOSIS — J42 Unspecified chronic bronchitis: Secondary | ICD-10-CM

## 2024-07-05 MED ORDER — ADVAIR HFA 115-21 MCG/ACT IN AERO: 2.0000 | INHALATION_SPRAY | Freq: Two times a day (BID) | RESPIRATORY_TRACT | 11 refills | Status: AC

## 2024-07-05 NOTE — Progress Notes (Signed)
 Subjective:   PATIENT ID: Katherine Waters GENDER: female DOB: [birth year], MRN: [id number]  Chief Complaint  Patient presents with   Asthma    Reason for Visit: F/u asthma  HPI  Katherine Waters is a a 73 year old with a history of AS s/p TAVR, hypertension, chronic diastolic heart failure, rheumatoid arthritis on biologics, and mixed connective tissue disease, presents with persistent shortness of breath. The patient was referred by her cardiologist for further evaluation with pulmonology.   The patient has been experiencing shortness of breath for several years, which has not improved despite weight loss and aortic valve replacement (TAVR) in April 2022. The shortness of breath is primarily experienced during physical activities such as walking and climbing stairs, and is not associated with wheezing or coughing. The patient has a history of asthma since childhood, but has not been on inhalers for over three years as she did not find them effective.   The patient also reports arthritic pain, which along with the shortness of breath, has limited her ability to exercise. She participated in cardiac rehabilitation a year after her TAVR procedure, but did not find it helpful for her breathing. The patient has not had any respiratory illnesses for several years.   01/05/24 Since our last visit she was started on Advair  HFA which she waited to start until December. She takes as directed except for the evening dose in the evenings. Does cause some hoarseness. Her cough and sputum production has improved some but her shortness of breath is unchanged. Does not use albuterol  due to side effects. She completed water therapy and now going to the pool three times a week at [facility]. Has lost weight with 40 lb weight loss with Ozempic. Followed by Cardiology for mitral valve/aortic valve stenosis.  07/05/24 She reports since our last visit and reports her shortness of breath remained unchanged. Denies  wheezing. She did stop Advair  and it did not change breathing but her mucous production did worsen so she restarted. She is participating in pool exercise classes. She overdid it last week with multiple classes but feels this is keeping her active and will scale back in order to continue.   Social History: Social smoker with negligible smoking history   Past Medical History:  Diagnosis Date   Arthritis    osteoarthritis   Asthma    Seasonal   Connective tissue disease (HCC)    CREST syndrome (HCC)    Depression    Diabetes 1.5, managed as type 2 (HCC)    Dyspnea    Fibromyalgia    GERD (gastroesophageal reflux disease)    History of migraine headaches    Horseshoe kidney    Hyperlipidemia    Hypertension    Inflammatory arthritis    PONV (postoperative nausea and vomiting)    Raynaud disease    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   s/p TAVR w/ a 29 mm Medtronic Evolut Pro + via the TF approach by Dr. Wonda and Dr. Dusty    Severe aortic stenosis    Sleep apnea    Mild, CPAP wore for one year     Family History  Problem Relation Age of Onset   COPD Mother    Heart attack Father 27   Cancer Sister    Cirrhosis Brother    Heart disease Brother      Social History   Occupational History   Occupation: retired Catering manager  Tobacco Use   Smoking status: Former  Current packs/day: 0.00    Average packs/day: 0.5 packs/day for 6.0 years (3.0 ttl pk-yrs)    Types: Cigarettes    Start date: 36    Quit date: 94    Years since quitting: 49.7    Passive exposure: Past   Smokeless tobacco: Never  Vaping Use   Vaping status: Never Used  Substance and Sexual Activity   Alcohol use: Yes    Alcohol/week: 1.0 standard drink of alcohol    Types: 1 Shots of liquor per week    Comment: occ   Drug use: Not Currently   Sexual activity: Not on file    Allergies  Allergen Reactions   Dobutamine     Decreases heart rate    Nsaids     Elevated  kidney levels recommended to avoid NSAIDS   Latex Rash   Sulfa Antibiotics Rash   Tape Rash    Adhesive/Band-aid     Outpatient Medications Prior to Visit  Medication Sig Dispense Refill   acetaminophen  (TYLENOL ) 500 MG tablet Take 1,000 mg by mouth daily.  may take an additional dose (1000 mg) as needed for pain     albuterol  (VENTOLIN  HFA) 108 (90 Base) MCG/ACT inhaler Inhale 2 puffs into the lungs every 6 (six) hours as needed. 18 g 2   amLODipine  (NORVASC ) 10 MG tablet Take 10 mg by mouth in the morning. (Patient taking differently: Take 5 mg by mouth in the morning.)     amoxicillin  (AMOXIL ) 500 MG tablet Take 4 tablets (2,000 mg total) one hour prior to all dental visits. 12 tablet 6   aspirin  81 MG chewable tablet Chew 1 tablet (81 mg total) by mouth daily.     DULoxetine  (CYMBALTA ) 60 MG capsule Take 60 mg by mouth in the morning.     indapamide  (LOZOL ) 2.5 MG tablet TAKE 2 TABLETS (5 MG TOTAL) BY MOUTH IN THE MORNING. 180 tablet 2   JARDIANCE  10 MG TABS tablet TAKE 1 TABLET BY MOUTH EVERY DAY BEFORE BREAKFAST 30 tablet 11   loratadine (CLARITIN) 10 MG tablet Take 10 mg by mouth daily.     omeprazole (PRILOSEC) 40 MG capsule Take 40 mg by mouth every morning.     oxyCODONE  (OXY IR/ROXICODONE ) 5 MG immediate release tablet Take 5 mg by mouth 3 (three) times daily.     PREBIOTIC PRODUCT PO Take 1 tablet by mouth daily.     Semaglutide (OZEMPIC, 1 MG/DOSE, [place]) Inject 2 mg into the skin once a week.     Spacer/Aero-Holding Chambers (OPTICHAMBER DIAMOND -LG MASK) DEVI Use with inhaler 1 each 0   spironolactone  (ALDACTONE ) 25 MG tablet TAKE 1 TABLET (25 MG TOTAL) BY MOUTH DAILY. 90 tablet 2   telmisartan (MICARDIS) 80 MG tablet Take 80 mg by mouth in the morning.     Tocilizumab (ACTEMRA) 162 MG/0.9ML SOSY Inject 0.9 mLs (162 mg total) as directed every 30 (thirty) days. 3.6 mL 0   ADVAIR  HFA 115-21 MCG/ACT inhaler Inhale 2 puffs into the lungs 2 (two) times daily. 12 g 5   No  facility-administered medications prior to visit.    Review of Systems  Constitutional:  Negative for chills, diaphoresis, fever, malaise/fatigue and weight loss.  HENT:  Negative for congestion.   Respiratory:  Positive for shortness of breath. Negative for cough, hemoptysis, sputum production and wheezing.   Cardiovascular:  Negative for chest pain, palpitations and leg swelling.     Objective:   Vitals:   07/05/24 1049  BP: ROLLEN)  128/59  Pulse: 79  SpO2: 96%  Weight: 194 lb 12.8 oz (88.4 kg)  Height: 5' 2 (1.575 m)   SpO2: 96 %  Body mass index is 35.63 kg/m.  Physical Exam: General: Well-appearing, no acute distress HENT: [place], AT Eyes: EOMI, no scleral icterus Respiratory: Clear to auscultation bilaterally.  No crackles, wheezing or rales Cardiovascular: RRR, +SEM, no JVD Extremities:-Edema,-tenderness Neuro: AAO x4, CNII-XII grossly intact Psych: Normal mood, normal affect   Data Reviewed:  Imaging: CXR 02/08/21 - Basilar atelectasis CT Chest 10/22/23 - No acute pulmonary issues. Scattered linear scarring. Mild infrahilar cylindrical bronchiectasis in lower lobes  PFT: 07/01/23 FVC 2.49 (91%) FEV1 1.99 (96%) Ratio 78  TLC 128%  RV 172% DLCO 73%. Partial bronchodilator response however not significant. Interpretation: Normal spirometry. Hyperinflation with air trapping present but unclear significance in absence of obstruction. Mildly reduced DLCO  Labs:    Latest Ref Rng & Units 12/09/2022    3:45 PM 04/02/2022   11:51 AM 02/13/2021    1:48 AM  CBC  WBC 4.0 - 10.5 K/uL 8.9  7.7  9.0   Hemoglobin 12.0 - 15.0 g/dL 84.5  84.0  87.9   Hematocrit 36.0 - 46.0 % 45.9  47.0  36.2   Platelets 150 - 400 K/uL 264  260  201       Latest Ref Rng & Units 12/09/2022    3:45 PM 04/14/2022    9:50 AM 04/02/2022   11:51 AM  CMP  Glucose 70 - 99 mg/dL 897  888  896   BUN 8 - 23 mg/dL 21  26  22    Creatinine 0.44 - 1.00 mg/dL 9.03  9.04  9.16   Sodium 135 - 145 mmol/L 135   141  136   Potassium 3.5 - 5.1 mmol/L 3.7  5.0  3.9   Chloride 98 - 111 mmol/L 97  100  101   CO2 22 - 32 mmol/L 24  27  23    Calcium  8.9 - 10.3 mg/dL 9.5  89.8  9.9        Assessment & Plan:   Discussion: 73 year old female with AS s/p TAVR, HTN, OSA, RA on biologics, secondary PH, morbid obesity who presents for follow-up for shortness of breath. Improved with exercise and ICS/LABA. We discussed clinically course of chronic bronchitis, preventive care including vaccines and exacerbations.  Chronic Shortness of Breath Mild bronchiectasis Persistent dyspnea despite TAVR procedure in 2022. No improvement with cardiac rehab. No associated wheezing or coughing. PFTs show hyperinflation and air trapping suggestive of underlying asthma. -CONTINUE Advair  115-21 mcg TWO puffs in the morning and evening. Use with spacer. Rinse mouth out after use -Continue regular aerobic exercise   Mixed Connective Tissue Disease Mildly reduced DLCO but CT 10/2023 with no evidence of fibrosis -ORDER PFTs in 6 months   Physical Deconditioning Limited exercise due to arthritic pain and shortness of breath. -Continue regular aerobic exercise in the pool   Health Maintenance Immunization History  Administered Date(s) Administered   Influenza-Unspecified 09/07/2023   CT Lung Screen - not qualified  Orders Placed This Encounter  Procedures   Pulmonary function test    Standing Status:   Future    Expected Date:   01/02/2025    Expiration Date:   07/05/2025    Where should this test be performed?:   Outpatient Pulmonary    What type of PFT is being ordered?:   Full PFT   Meds ordered this encounter  Medications  ADVAIR  HFA 115-21 MCG/ACT inhaler    Sig: Inhale 2 puffs into the lungs 2 (two) times daily.    Dispense:  12 g    Refill:  11    Brand name    Return in about 6 months (around 01/02/2025) for after PFT.  I have spent a total time of 31-minutes on the day of the appointment including  chart review, data review, collecting history, coordinating care and discussing medical diagnosis and plan with the patient/family. Past medical history, allergies, medications were reviewed. Pertinent imaging, labs and tests included in this note have been reviewed and interpreted independently by me.  Raquell Richer Slater Staff, MD [facility] 07/05/2024 6:55 PM

## 2024-07-05 NOTE — Patient Instructions (Signed)
 Chronic Shortness of Breath Mild bronchiectasis Persistent dyspnea despite TAVR procedure in 2022. No improvement with cardiac rehab. No associated wheezing or coughing. PFTs show hyperinflation and air trapping suggestive of underlying asthma. -CONTINUE Advair  115-21 mcg TWO puffs in the morning and evening. Use with spacer. Rinse mouth out after use -Continue regular aerobic exercise -Encourage ongoing weight loss   Mixed Connective Tissue Disease Mildly reduced DLCO but CT 10/2023 with no evidence of fibrosis -ORDER PFTs in 6 months   Physical Deconditioning Limited exercise due to arthritic pain and shortness of breath. -Continue regular aerobic exercise in the pool

## 2024-07-13 ENCOUNTER — Other Ambulatory Visit: Payer: Self-pay | Admitting: Cardiovascular Disease

## 2024-08-22 ENCOUNTER — Other Ambulatory Visit (HOSPITAL_BASED_OUTPATIENT_CLINIC_OR_DEPARTMENT_OTHER): Payer: Self-pay

## 2024-08-22 MED ORDER — FLUZONE HIGH-DOSE 0.5 ML IM SUSY
0.5000 mL | PREFILLED_SYRINGE | Freq: Once | INTRAMUSCULAR | 0 refills | Status: AC
  Filled 2024-08-22: qty 0.5, 1d supply, fill #0

## 2024-08-25 ENCOUNTER — Emergency Department (HOSPITAL_COMMUNITY): Admission: EM | Admit: 2024-08-25 | Discharge: 2024-08-25 | Disposition: A | Discharge status: Home / Self Care

## 2024-08-25 ENCOUNTER — Other Ambulatory Visit: Payer: Self-pay

## 2024-08-25 ENCOUNTER — Emergency Department (HOSPITAL_COMMUNITY)

## 2024-08-25 ENCOUNTER — Encounter (HOSPITAL_COMMUNITY): Payer: Self-pay | Admitting: Emergency Medicine

## 2024-08-25 DIAGNOSIS — R7989 Other specified abnormal findings of blood chemistry: Secondary | ICD-10-CM | POA: Insufficient documentation

## 2024-08-25 DIAGNOSIS — M79642 Pain in left hand: Secondary | ICD-10-CM | POA: Insufficient documentation

## 2024-08-25 DIAGNOSIS — M25512 Pain in left shoulder: Secondary | ICD-10-CM | POA: Insufficient documentation

## 2024-08-25 DIAGNOSIS — R7402 Elevation of levels of lactic acid dehydrogenase (LDH): Secondary | ICD-10-CM | POA: Insufficient documentation

## 2024-08-25 DIAGNOSIS — Z7982 Long term (current) use of aspirin: Secondary | ICD-10-CM | POA: Diagnosis not present

## 2024-08-25 DIAGNOSIS — Y92410 Unspecified street and highway as the place of occurrence of the external cause: Secondary | ICD-10-CM | POA: Insufficient documentation

## 2024-08-25 DIAGNOSIS — M7918 Myalgia, other site: Secondary | ICD-10-CM

## 2024-08-25 DIAGNOSIS — S065X0A Traumatic subdural hemorrhage without loss of consciousness, initial encounter: Secondary | ICD-10-CM | POA: Insufficient documentation

## 2024-08-25 DIAGNOSIS — S065XAA Traumatic subdural hemorrhage with loss of consciousness status unknown, initial encounter: Secondary | ICD-10-CM

## 2024-08-25 DIAGNOSIS — Z91040 Latex allergy status: Secondary | ICD-10-CM | POA: Diagnosis not present

## 2024-08-25 DIAGNOSIS — S0990XA Unspecified injury of head, initial encounter: Secondary | ICD-10-CM | POA: Diagnosis present

## 2024-08-25 LAB — COMPREHENSIVE METABOLIC PANEL WITH GFR
ALT: 46 U/L — ABNORMAL HIGH (ref 0–44)
AST: 41 U/L (ref 15–41)
Albumin: 4.2 g/dL (ref 3.5–5.0)
Alkaline Phosphatase: 71 U/L (ref 38–126)
Anion gap: 12 (ref 5–15)
BUN: 15 mg/dL (ref 8–23)
CO2: 24 mmol/L (ref 22–32)
Calcium: 9.4 mg/dL (ref 8.9–10.3)
Chloride: 100 mmol/L (ref 98–111)
Creatinine, Ser: 1.03 mg/dL — ABNORMAL HIGH (ref 0.44–1.00)
GFR, Estimated: 57 mL/min — ABNORMAL LOW (ref >60)
Glucose, Bld: 98 mg/dL (ref 70–99)
Potassium: 3.5 mmol/L (ref 3.5–5.1)
Sodium: 136 mmol/L (ref 135–145)
Total Bilirubin: 1.3 mg/dL — ABNORMAL HIGH (ref 0.0–1.2)
Total Protein: 6.7 g/dL (ref 6.5–8.1)

## 2024-08-25 LAB — URINALYSIS, ROUTINE W REFLEX MICROSCOPIC
Bacteria, UA: NONE SEEN
Bilirubin Urine: NEGATIVE
Glucose, UA: >=500 mg/dL — AB
Hgb urine dipstick: NEGATIVE
Ketones, ur: NEGATIVE mg/dL
Leukocytes,Ua: NEGATIVE
Nitrite: NEGATIVE
Protein, ur: NEGATIVE mg/dL
Specific Gravity, Urine: 1.033 — ABNORMAL HIGH (ref 1.005–1.030)
pH: 6 (ref 5.0–8.0)

## 2024-08-25 LAB — I-STAT CHEM 8, ED
BUN: 27 mg/dL — ABNORMAL HIGH (ref 8–23)
Calcium, Ion: 1.09 mmol/L — ABNORMAL LOW (ref 1.15–1.40)
Chloride: 101 mmol/L (ref 98–111)
Creatinine, Ser: 1.2 mg/dL — ABNORMAL HIGH (ref 0.44–1.00)
Glucose, Bld: 143 mg/dL — ABNORMAL HIGH (ref 70–99)
HCT: 48 % — ABNORMAL HIGH (ref 36.0–46.0)
Hemoglobin: 16.3 g/dL — ABNORMAL HIGH (ref 12.0–15.0)
Potassium: 4.4 mmol/L (ref 3.5–5.1)
Sodium: 136 mmol/L (ref 135–145)
TCO2: 25 mmol/L (ref 22–32)

## 2024-08-25 LAB — CBC
HCT: 49.1 % — ABNORMAL HIGH (ref 36.0–46.0)
Hemoglobin: 16.8 g/dL — ABNORMAL HIGH (ref 12.0–15.0)
MCH: 33.3 pg (ref 26.0–34.0)
MCHC: 34.2 g/dL (ref 30.0–36.0)
MCV: 97.4 fL (ref 80.0–100.0)
Platelets: 162 K/uL (ref 150–400)
RBC: 5.04 MIL/uL (ref 3.87–5.11)
RDW: 13.2 % (ref 11.5–15.5)
WBC: 8.2 K/uL (ref 4.0–10.5)
nRBC: 0 % (ref 0.0–0.2)

## 2024-08-25 LAB — ETHANOL: Alcohol, Ethyl (B): <15 mg/dL (ref <15)

## 2024-08-25 LAB — PROTIME-INR
INR: 1 (ref 0.8–1.2)
Prothrombin Time: 13.7 s (ref 11.4–15.2)

## 2024-08-25 LAB — SAMPLE TO BLOOD BANK

## 2024-08-25 LAB — I-STAT CG4 LACTIC ACID, ED: Lactic Acid, Venous: 2.2 mmol/L (ref 0.5–1.9)

## 2024-08-25 MED ORDER — IOHEXOL 350 MG/ML SOLN
75.0000 mL | Freq: Once | INTRAVENOUS | Status: AC | PRN
  Administered 2024-08-25: 75 mL via INTRAVENOUS

## 2024-08-25 NOTE — ED Notes (Signed)
 MD at bedside.
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
## 2024-08-25 NOTE — ED Notes (Incomplete)
 Trauma Response Nurse Documentation   Katherine Waters is a 73 y.o. female arriving to [facility] ED via [facility]  On No antithrombotic. Trauma was activated as a Level 2 by Charge Rn based on the following trauma criteria Discretion of Emergency Department Physician.  Patient cleared for CT by Dr. Sharlet. Pt transported to CT with trauma response nurse present to monitor. RN remained with the patient throughout their absence from the department for clinical observation.   GCS 15.  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 Wonda and Dr. Dusty    Severe aortic stenosis    Sleep apnea    Mild, CPAP wore for one year     Past Surgical History:  Procedure Laterality Date   ABDOMINAL HYSTERECTOMY     BREAST BIOPSY Right    CARDIAC CATHETERIZATION     CATARACT EXTRACTION, BILATERAL     COLONOSCOPY WITH ESOPHAGOGASTRODUODENOSCOPY (EGD) AND ESOPHAGEAL DILATION (ED)     2016   INTRAOPERATIVE TRANSTHORACIC ECHOCARDIOGRAM Left 02/12/2021   Procedure: INTRAOPERATIVE TRANSTHORACIC ECHOCARDIOGRAM;  Surgeon: Wonda Sharper, MD;  Location: [facility] OR;  Service: Open Heart Surgery;  Laterality: Left;   LAPAROSCOPIC BILATERAL SALPINGO OOPHERECTOMY Bilateral 04/02/2022   Procedure: LAPAROSCOPIC BILATERAL SALPINGO OOPHORECTOMY;  Surgeon: Rosalva Sawyer, MD;  Location: [facility] OR;  Service: Gynecology;  Laterality: Bilateral;   LUMBAR FUSION  2013   POSTERIOR  TIBIAL TENDON REPAIR Left 2019   RIGHT/LEFT HEART CATH AND CORONARY ANGIOGRAPHY N/A 01/29/2021   Procedure: RIGHT/LEFT HEART CATH AND CORONARY ANGIOGRAPHY;  Surgeon: Claudene Victory ORN, MD;  Location: [facility];  Service: Cardiovascular;  Laterality: N/A;   ROTATOR CUFF REPAIR Right    SHOULDER ARTHROSCOPY WITH DISTAL CLAVICLE RESECTION Left 12/09/2022   Procedure: SHOULDER ARTHROSCOPY WITH DISTAL CLAVICLE RESECTION AND SUBACROMIAL DECOMPRESSION,LABRAL DEBRIDEMENT;  Surgeon: Melita Drivers, MD;  Location: [facility] ORS;  Service: Orthopedics;  Laterality: Left;  exparel  block 120   SHOULDER ARTHROSCOPY WITH ROTATOR CUFF REPAIR Left 12/09/2022   Procedure: SHOULDER ARTHROSCOPY WITH DERIDEMENT AND  ROTATOR CUFF REPAIR;  Surgeon: Melita Drivers, MD;  Location: [facility] ORS;  Service: Orthopedics;  Laterality: Left;  exparel  block 120   TRANSCATHETER AORTIC VALVE REPLACEMENT, TRANSFEMORAL N/A 02/12/2021   Procedure: TRANSCATHETER AORTIC VALVE REPLACEMENT, TRANSFEMORAL;  Surgeon: Wonda Sharper, MD;  Location: [facility] OR;  Service: Open Heart Surgery;  Laterality: N/A;   TUBAL LIGATION     ULTRASOUND GUIDANCE FOR VASCULAR ACCESS Bilateral 02/12/2021   Procedure: ULTRASOUND GUIDANCE FOR VASCULAR ACCESS;  Surgeon: Wonda Sharper, MD;  Location: [facility] OR;  Service: Open Heart Surgery;  Laterality: Bilateral;   WISDOM TOOTH EXTRACTION  1973       Initial Focused Assessment (If applicable, or please see trauma documentation):  Airway - clear Breathing - Unlabored Circulation - no external bleeding  noted- does have hematomas and swelling to left hand, shoulder.   GCS - 15  CT's Completed:   CT Head, CT C-Spine, CT Chest w/ contrast, and CT abdomen/pelvis w/ contrast   Interventions:  Labs Xrays CT scans FAST   Plan for disposition:  {Trauma Dispo:26867}   Consults completed:  {Trauma Consults:26862} at ***.  Event Summary:  MTP Summary (If applicable):   Bedside handoff with {Trauma  handoff:26863::ED RN} ***.    Katherine Waters  Trauma Response RN  Please call TRN at [phone] for further assistance.

## 2024-08-25 NOTE — Progress Notes (Signed)
 Orthopedic Tech Progress Note Patient Details:  Katherine Waters [birth year] [id number]  Patient ID: Chiquita Heckert, female   DOB: December 05, 1950, 73 y.o.   MRN: [id number]  Responded to Level 2 Trauma Ortho Not needed.  Jeyren Danowski F Beatryce Colombo 08/25/2024, 11:59 AM
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
## 2024-08-25 NOTE — ED Notes (Signed)
 PT given water to produce urine sample
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
## 2024-09-01 ENCOUNTER — Ambulatory Visit: Attending: Cardiovascular Disease | Admitting: Cardiovascular Disease

## 2024-09-01 ENCOUNTER — Encounter: Payer: Self-pay | Admitting: Cardiovascular Disease

## 2024-09-01 VITALS — BP 120/60 | HR 81 | Ht 62.0 in | Wt 189.0 lb

## 2024-09-01 DIAGNOSIS — I10 Essential (primary) hypertension: Secondary | ICD-10-CM | POA: Diagnosis present

## 2024-09-01 DIAGNOSIS — I342 Nonrheumatic mitral (valve) stenosis: Secondary | ICD-10-CM | POA: Insufficient documentation

## 2024-09-01 DIAGNOSIS — Z952 Presence of prosthetic heart valve: Secondary | ICD-10-CM | POA: Insufficient documentation

## 2024-09-01 DIAGNOSIS — I5032 Chronic diastolic (congestive) heart failure: Secondary | ICD-10-CM | POA: Diagnosis present

## 2024-09-01 DIAGNOSIS — R0602 Shortness of breath: Secondary | ICD-10-CM | POA: Diagnosis present

## 2024-09-01 DIAGNOSIS — I272 Pulmonary hypertension, unspecified: Secondary | ICD-10-CM | POA: Insufficient documentation

## 2024-09-01 NOTE — Patient Instructions (Signed)
 Medication Instructions:  Your physician recommends that you continue on your current medications as directed. Please refer to the Current Medication list given to you today.  *If you need a refill on your cardiac medications before your next appointment, please call your pharmacy*  Testing/Procedures: Your physician has requested that you have an echocardiogram. Echocardiography is a painless test that uses sound waves to create images of your heart. It provides your doctor with information about the size and shape of your heart and how well your heart's chambers and valves are working. This procedure takes approximately one hour. There are no restrictions for this procedure. Please do NOT wear cologne, perfume, aftershave, or lotions (deodorant is allowed). Please arrive 15 minutes prior to your appointment time.  Please note: We ask at that you not bring children with you during ultrasound (echo/ vascular) testing. Due to room size and safety concerns, children are not allowed in the ultrasound rooms during exams. Our front office staff cannot provide observation of children in our lobby area while testing is being conducted. An adult accompanying a patient to their appointment will only be allowed in the ultrasound room at the discretion of the ultrasound technician under special circumstances. We apologize for any inconvenience. **To do in November 2026, prior to office visit**   Follow-Up: At [facility], you and your health needs are our priority.  As part of our continuing mission to provide you with exceptional heart care, our providers are all part of one team.  This team includes your primary Cardiologist (physician) and Advanced Practice Providers or APPs (Physician Assistants and Nurse Practitioners) who all work together to provide you with the care you need, when you need it.  Your next appointment:   12 month(s)   Provider:   Darryle ONEIDA Decent, MD

## 2024-09-29 ENCOUNTER — Other Ambulatory Visit: Payer: Self-pay | Admitting: Cardiovascular Disease

## 2024-10-16 ENCOUNTER — Emergency Department (HOSPITAL_BASED_OUTPATIENT_CLINIC_OR_DEPARTMENT_OTHER)
Admission: EM | Admit: 2024-10-16 | Discharge: 2024-10-16 | Disposition: A | Discharge status: Home / Self Care | Attending: Emergency Medicine | Admitting: Emergency Medicine

## 2024-10-16 ENCOUNTER — Encounter (HOSPITAL_BASED_OUTPATIENT_CLINIC_OR_DEPARTMENT_OTHER): Payer: Self-pay

## 2024-10-16 ENCOUNTER — Emergency Department (HOSPITAL_BASED_OUTPATIENT_CLINIC_OR_DEPARTMENT_OTHER): Admitting: Radiology

## 2024-10-16 DIAGNOSIS — Z7951 Long term (current) use of inhaled steroids: Secondary | ICD-10-CM | POA: Diagnosis not present

## 2024-10-16 DIAGNOSIS — J45909 Unspecified asthma, uncomplicated: Secondary | ICD-10-CM | POA: Insufficient documentation

## 2024-10-16 DIAGNOSIS — E119 Type 2 diabetes mellitus without complications: Secondary | ICD-10-CM | POA: Insufficient documentation

## 2024-10-16 DIAGNOSIS — R072 Precordial pain: Secondary | ICD-10-CM | POA: Insufficient documentation

## 2024-10-16 DIAGNOSIS — Z7982 Long term (current) use of aspirin: Secondary | ICD-10-CM | POA: Insufficient documentation

## 2024-10-16 DIAGNOSIS — Z91040 Latex allergy status: Secondary | ICD-10-CM | POA: Insufficient documentation

## 2024-10-16 DIAGNOSIS — R079 Chest pain, unspecified: Secondary | ICD-10-CM

## 2024-10-16 DIAGNOSIS — I10 Essential (primary) hypertension: Secondary | ICD-10-CM | POA: Insufficient documentation

## 2024-10-16 DIAGNOSIS — Z79899 Other long term (current) drug therapy: Secondary | ICD-10-CM | POA: Diagnosis not present

## 2024-10-16 LAB — BASIC METABOLIC PANEL WITH GFR
Anion gap: 11 (ref 5–15)
BUN: 18 mg/dL (ref 8–23)
CO2: 26 mmol/L (ref 22–32)
Calcium: 10.9 mg/dL — ABNORMAL HIGH (ref 8.9–10.3)
Chloride: 96 mmol/L — ABNORMAL LOW (ref 98–111)
Creatinine, Ser: 0.91 mg/dL (ref 0.44–1.00)
GFR, Estimated: >60 mL/min
Glucose, Bld: 105 mg/dL — ABNORMAL HIGH (ref 70–99)
Potassium: 3.7 mmol/L (ref 3.5–5.1)
Sodium: 134 mmol/L — ABNORMAL LOW (ref 135–145)

## 2024-10-16 LAB — TROPONIN T, HIGH SENSITIVITY
Troponin T High Sensitivity: 17 ng/L (ref 0–19)
Troponin T High Sensitivity: 18 ng/L (ref 0–19)

## 2024-10-16 LAB — CBC
HCT: 50.7 % — ABNORMAL HIGH (ref 36.0–46.0)
Hemoglobin: 17.4 g/dL — ABNORMAL HIGH (ref 12.0–15.0)
MCH: 32.6 pg (ref 26.0–34.0)
MCHC: 34.3 g/dL (ref 30.0–36.0)
MCV: 94.9 fL (ref 80.0–100.0)
Platelets: 198 K/uL (ref 150–400)
RBC: 5.34 MIL/uL — ABNORMAL HIGH (ref 3.87–5.11)
RDW: 12.6 % (ref 11.5–15.5)
WBC: 8.9 K/uL (ref 4.0–10.5)
nRBC: 0 % (ref 0.0–0.2)

## 2024-10-16 MED ORDER — HYDROXYZINE HCL 25 MG PO TABS: 25.0000 mg | ORAL_TABLET | Freq: Every day | ORAL | 0 refills | Status: DC | PRN

## 2024-10-16 MED ORDER — LIDOCAINE VISCOUS HCL 2 % MT SOLN
15.0000 mL | Freq: Once | OROMUCOSAL | Status: AC
  Administered 2024-10-16: 15 mL via ORAL
  Filled 2024-10-16: qty 15

## 2024-10-16 MED ORDER — ALUM & MAG HYDROXIDE-SIMETH 200-200-20 MG/5ML PO SUSP
30.0000 mL | Freq: Once | ORAL | Status: AC
  Administered 2024-10-16: 30 mL via ORAL
  Filled 2024-10-16: qty 30

## 2024-10-16 MED ORDER — HYDROXYZINE HCL 25 MG PO TABS
25.0000 mg | ORAL_TABLET | Freq: Once | ORAL | Status: AC
  Administered 2024-10-16: 25 mg via ORAL
  Filled 2024-10-16: qty 1

## 2024-10-16 NOTE — ED Notes (Signed)
 DC paperwork given and verbally understood.
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
## 2024-10-16 NOTE — ED Triage Notes (Signed)
 She  c/o central area intermittent chest pains x 3 days. She denies fever, nor any other sign of current illness.

## 2024-10-16 NOTE — Discharge Instructions (Signed)
 Katherine Waters  Thank you for allowing us  to take care of you today.  You came to the Emergency Department today because you have had chest pain which has been making it particularly difficult for you to sleep with the past several nights.  Here in the emergency department your workup was reassuring, in particular your heart number was normal twice, therefore your symptoms are not due to a heart attack or other emergency related to your heart or lungs.  It is unclear what exactly is causing your symptoms, you did feel better after treatment with Maalox, an antiacid medication, as well as Atarax  which is an antianxiety medication.  Anxiety and reflux can cause to chest pain, therefore it is possible that these are contributing to your symptoms.  In addition to your omeprazole we recommend that you take Pepcid once a day for the next 2 weeks, this is a over-the-counter antiacid medication that works in a different mechanism than omeprazole so it will further reduce your acid in case your GERD is contributing.  You can also purchase some Maalox over-the-counter to use for the symptoms.  We will prescribe a few doses of Atarax  that you can use as needed for anxiety, please use this medication sparingly as it can increase your risk of falls, urinary retention, confusion, etc.   To-Do: 1. Please follow-up with your primary doctor within 1 - 2 weeks / as soon as possible.    Please return to the Emergency Department or call 911 if you experience have worsening of your symptoms, or do not get better, new or different chest pain, shortness of breath, severe or significantly worsening pain, high fever, severe confusion, pass out or have any reason to think that you need emergency medical care.   We hope you feel better soon.   Mitzie Later, MD Department of Emergency Medicine [facility]
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
## 2024-10-25 ENCOUNTER — Encounter: Payer: Self-pay | Admitting: Cardiovascular Disease

## 2024-10-25 ENCOUNTER — Ambulatory Visit: Attending: Cardiovascular Disease | Admitting: Cardiovascular Disease

## 2024-10-25 VITALS — BP 122/64 | HR 74 | Ht 62.0 in | Wt 189.2 lb

## 2024-10-25 DIAGNOSIS — I5032 Chronic diastolic (congestive) heart failure: Secondary | ICD-10-CM | POA: Insufficient documentation

## 2024-10-25 DIAGNOSIS — R0602 Shortness of breath: Secondary | ICD-10-CM | POA: Insufficient documentation

## 2024-10-25 DIAGNOSIS — Z952 Presence of prosthetic heart valve: Secondary | ICD-10-CM | POA: Diagnosis not present

## 2024-10-25 DIAGNOSIS — I10 Essential (primary) hypertension: Secondary | ICD-10-CM | POA: Insufficient documentation

## 2024-10-25 DIAGNOSIS — I272 Pulmonary hypertension, unspecified: Secondary | ICD-10-CM | POA: Insufficient documentation

## 2024-10-25 NOTE — Patient Instructions (Signed)
 Medication Instructions:  Your physician recommends that you continue on your current medications as directed. Please refer to the Current Medication list given to you today.  *If you need a refill on your cardiac medications before your next appointment, please call your pharmacy*  Lab Work: NONE    Testing/Procedures: NONE   Follow-Up: At [facility], you and your health needs are our priority.  As part of our continuing mission to provide you with exceptional heart care, our providers are all part of one team.  This team includes your primary Cardiologist (physician) and Advanced Practice Providers or APPs (Physician Assistants and Nurse Practitioners) who all work together to provide you with the care you need, when you need it.  Your next appointment:   6 month(s)  Provider:   Darryle ONEIDA Decent, MD

## 2024-11-25 ENCOUNTER — Encounter (HOSPITAL_BASED_OUTPATIENT_CLINIC_OR_DEPARTMENT_OTHER): Payer: Self-pay | Admitting: Pulmonary Disease
